# Patient Record
Sex: Male | Born: 1990 | Race: White | Hispanic: No | Marital: Married | Smoking: Former smoker
Health system: Southern US, Community
[De-identification: ages and names within clinical notes are randomized; demographics above are authoritative.]

## PROBLEM LIST (undated history)

## (undated) DIAGNOSIS — R569 Unspecified convulsions: Secondary | ICD-10-CM

## (undated) DIAGNOSIS — M352 Behcet's disease: Secondary | ICD-10-CM

## (undated) DIAGNOSIS — G0481 Other encephalitis and encephalomyelitis: Secondary | ICD-10-CM

## (undated) DIAGNOSIS — K529 Noninfective gastroenteritis and colitis, unspecified: Secondary | ICD-10-CM

## (undated) DIAGNOSIS — G939 Disorder of brain, unspecified: Secondary | ICD-10-CM

## (undated) HISTORY — PX: HYPOSPADIAS CORRECTION: SHX483

---

## 2013-12-09 ENCOUNTER — Encounter (HOSPITAL_COMMUNITY): Payer: Self-pay | Admitting: Emergency Medicine

## 2013-12-09 ENCOUNTER — Emergency Department (HOSPITAL_COMMUNITY): Payer: Managed Care, Other (non HMO)

## 2013-12-09 ENCOUNTER — Emergency Department (HOSPITAL_COMMUNITY)
Admission: EM | Admit: 2013-12-09 | Discharge: 2013-12-09 | Disposition: A | Discharge status: Home / Self Care | Payer: Managed Care, Other (non HMO) | Attending: Emergency Medicine | Admitting: Emergency Medicine

## 2013-12-09 DIAGNOSIS — R109 Unspecified abdominal pain: Secondary | ICD-10-CM | POA: Insufficient documentation

## 2013-12-09 DIAGNOSIS — F17200 Nicotine dependence, unspecified, uncomplicated: Secondary | ICD-10-CM | POA: Insufficient documentation

## 2013-12-09 DIAGNOSIS — K5289 Other specified noninfective gastroenteritis and colitis: Secondary | ICD-10-CM | POA: Insufficient documentation

## 2013-12-09 HISTORY — DX: Noninfective gastroenteritis and colitis, unspecified: K52.9

## 2013-12-09 LAB — URINALYSIS, ROUTINE W REFLEX MICROSCOPIC
Bilirubin Urine: NEGATIVE
GLUCOSE, UA: NEGATIVE mg/dL
Ketones, ur: NEGATIVE mg/dL
LEUKOCYTES UA: NEGATIVE
Nitrite: NEGATIVE
PH: 5.5 (ref 5.0–8.0)
PROTEIN: NEGATIVE mg/dL
Specific Gravity, Urine: 1.012 (ref 1.005–1.030)
Urobilinogen, UA: 0.2 mg/dL (ref 0.0–1.0)

## 2013-12-09 LAB — COMPREHENSIVE METABOLIC PANEL
ALBUMIN: 4.2 g/dL (ref 3.5–5.2)
ALT: 13 U/L (ref 0–53)
AST: 16 U/L (ref 0–37)
Alkaline Phosphatase: 89 U/L (ref 39–117)
BUN: 10 mg/dL (ref 6–23)
CHLORIDE: 102 meq/L (ref 96–112)
CO2: 24 meq/L (ref 19–32)
Calcium: 9.7 mg/dL (ref 8.4–10.5)
Creatinine, Ser: 0.89 mg/dL (ref 0.50–1.35)
GFR calc Af Amer: >90 mL/min (ref >90)
Glucose, Bld: 101 mg/dL — ABNORMAL HIGH (ref 70–99)
Potassium: 4.1 mEq/L (ref 3.7–5.3)
SODIUM: 138 meq/L (ref 137–147)
Total Bilirubin: <0.2 mg/dL — ABNORMAL LOW (ref 0.3–1.2)
Total Protein: 7.2 g/dL (ref 6.0–8.3)

## 2013-12-09 LAB — CBC
HCT: 41.6 % (ref 39.0–52.0)
Hemoglobin: 14.8 g/dL (ref 13.0–17.0)
MCH: 30.3 pg (ref 26.0–34.0)
MCHC: 35.6 g/dL (ref 30.0–36.0)
MCV: 85.1 fL (ref 78.0–100.0)
PLATELETS: 235 10*3/uL (ref 150–400)
RBC: 4.89 MIL/uL (ref 4.22–5.81)
RDW: 12.3 % (ref 11.5–15.5)
WBC: 15.4 10*3/uL — AB (ref 4.0–10.5)

## 2013-12-09 LAB — LIPASE, BLOOD: Lipase: 22 U/L (ref 11–59)

## 2013-12-09 LAB — URINE MICROSCOPIC-ADD ON

## 2013-12-09 MED ORDER — ONDANSETRON HCL 4 MG PO TABS: 4.0000 mg | ORAL_TABLET | Freq: Four times a day (QID) | ORAL | Status: DC

## 2013-12-09 MED ORDER — SODIUM CHLORIDE 0.9 % IV SOLN
INTRAVENOUS | Status: DC
  Administered 2013-12-09: 04:00:00 via INTRAVENOUS

## 2013-12-09 MED ORDER — FENTANYL CITRATE 0.05 MG/ML IJ SOLN
50.0000 ug | INTRAMUSCULAR | Status: DC | PRN
  Administered 2013-12-09: 50 ug via INTRAVENOUS
  Filled 2013-12-09: qty 2

## 2013-12-09 MED ORDER — HYDROMORPHONE HCL PF 1 MG/ML IJ SOLN: 1.0000 mg | Freq: Once | INTRAMUSCULAR | Status: DC

## 2013-12-09 MED ORDER — IOHEXOL 300 MG/ML  SOLN
100.0000 mL | Freq: Once | INTRAMUSCULAR | Status: AC | PRN
  Administered 2013-12-09: 100 mL via INTRAVENOUS

## 2013-12-09 MED ORDER — ONDANSETRON HCL 4 MG/2ML IJ SOLN
4.0000 mg | Freq: Once | INTRAMUSCULAR | Status: AC
  Administered 2013-12-09: 4 mg via INTRAVENOUS
  Filled 2013-12-09: qty 2

## 2013-12-09 MED ORDER — HYDROMORPHONE HCL PF 1 MG/ML IJ SOLN
1.0000 mg | Freq: Once | INTRAMUSCULAR | Status: AC
  Administered 2013-12-09: 1 mg via INTRAVENOUS
  Filled 2013-12-09: qty 1

## 2013-12-09 MED ORDER — HYDROCODONE-ACETAMINOPHEN 5-325 MG PO TABS: 2.0000 | ORAL_TABLET | ORAL | Status: DC | PRN

## 2013-12-09 MED ORDER — IOHEXOL 300 MG/ML  SOLN
50.0000 mL | Freq: Once | INTRAMUSCULAR | Status: AC | PRN
  Administered 2013-12-09: 50 mL via ORAL

## 2013-12-09 NOTE — ED Notes (Signed)
Patient transported to CT 

## 2013-12-09 NOTE — ED Provider Notes (Signed)
CSN: [id number]     Arrival date & time 12/09/13  0144 History   First MD Initiated Contact with Patient 12/09/13 0309     Chief Complaint  Patient presents with  . Abdominal Pain     (Consider location/radiation/quality/duration/timing/severity/associated sxs/prior Treatment) HPI History provided by patient. Mid abdominal pain onset yesterday, sharp and intermittent pain gradually progressing to moderate to severe pain, doubling him over at times. Patient at work tonight when he became more severe, unable to work and presents here for further evaluation. States he had similar symptoms about a year ago, [facility] and told he may have IBS. He was given outpatient referrals but never followed up. He denies having any significant symptoms over the last year since that time.  With pain, no associated nausea vomiting or diarrhea. No blood in stools. No constipation. No hematuria. No rash. No recent travel. No recent sick contacts. No radiation of pain. No known alleviating or aggravating factors.    Past Medical History  Diagnosis Date  . IBD (inflammatory bowel disease)    Past Surgical History  Procedure Laterality Date  . Hypospadias correction     No family history on file. History  Substance Use Topics  . Smoking status: Current Every Day Smoker -- 0.50 packs/day    Types: Cigarettes  . Smokeless tobacco: Not on file  . Alcohol Use: Yes     Comment: occ    Review of Systems  Constitutional: Negative for fever and chills.  Respiratory: Negative for shortness of breath.   Cardiovascular: Negative for chest pain.  Gastrointestinal: Positive for abdominal pain. Negative for vomiting and blood in stool.  Genitourinary: Negative for hematuria and flank pain.  Musculoskeletal: Negative for back pain.  Skin: Negative for rash.  Neurological: Negative for weakness.  All other systems reviewed and are negative.      Allergies  Review of patient's allergies  indicates no known allergies.  Home Medications   Current Outpatient Rx  Name  Route  Sig  Dispense  Refill  . ibuprofen (ADVIL,MOTRIN) 200 MG tablet   Oral   Take 400 mg by mouth every 6 (six) hours as needed.          BP 129/69  Pulse 98  Temp(Src) 98.1 F (36.7 C) (Oral)  Resp 20  SpO2 98% Physical Exam  Constitutional: He is oriented to person, place, and time. He appears well-developed and well-nourished.  HENT:  Head: Normocephalic and atraumatic.  Eyes: EOM are normal. Pupils are equal, round, and reactive to light. No scleral icterus.  Neck: Neck supple.  Cardiovascular: Normal rate, regular rhythm and intact distal pulses.   Pulmonary/Chest: Effort normal and breath sounds normal. No respiratory distress.  Abdominal: Soft. Bowel sounds are normal. He exhibits no mass.  Tender periumbilical and right lower quadrant with some voluntary guarding  Musculoskeletal: Normal range of motion. He exhibits no edema.  Neurological: He is alert and oriented to person, place, and time.  Skin: Skin is warm and dry.    ED Course  Procedures (including critical care time) Labs Review Labs Reviewed  CBC - Abnormal; Notable for the following:    WBC 15.4 (*)    All other components within normal limits  COMPREHENSIVE METABOLIC PANEL - Abnormal; Notable for the following:    Glucose, Bld 101 (*)    Total Bilirubin <0.2 (*)    All other components within normal limits  URINALYSIS, ROUTINE W REFLEX MICROSCOPIC - Abnormal; Notable for the following:  Hgb urine dipstick TRACE (*)    All other components within normal limits  LIPASE, BLOOD  URINE MICROSCOPIC-ADD ON   Imaging Review Ct Abdomen Pelvis W Contrast  12/09/2013   CLINICAL DATA:  Mid and right lower quadrant abdominal pain  EXAM: CT ABDOMEN AND PELVIS WITH CONTRAST  TECHNIQUE: Multidetector CT imaging of the abdomen and pelvis was performed using the standard protocol following bolus administration of intravenous  contrast.  CONTRAST:  OMNIPAQUE IOHEXOL 300 MG/ML  SOLN  COMPARISON:  Prior CT from 01/27/2013.  FINDINGS: The visualized lung bases are clear.  The liver demonstrates a normal contrast enhanced appearance. Gallbladder is within normal limits. No biliary ductal dilatation. The spleen, adrenal glands, and pancreas demonstrate a normal contrast enhanced appearance.  The kidneys are equal in size with symmetric enhancement. No nephrolithiasis, hydronephrosis, or focal enhancing renal mass.  There is no evidence of bowel obstruction. The appendix is mildly prominent measuring up to 7 mm without associated inflammatory changes to suggest acute appendicitis. This finding is stable as compared to prior study. No abnormal wall thickening, mucosal enhancement, or inflammatory fat stranding seen about the bowels.  Bladder is normal.  Prostate within normal limits.  No free air or fluid. No enlarged intra-abdominal pelvic lymph nodes.  No acute osseous abnormality. No focal lytic or blastic osseous lesions.  IMPRESSION: 1. No CT evidence of acute intra-abdominal or pelvic process. 2. Mildly prominent appendix measuring up to 7 mm in diameter without associated inflammatory changes. This finding is stable as compared to prior CT from 01/27/2013.   Electronically Signed   By: Rise Mu M.D.   On: 12/09/2013 05:04    IV Dilaudid. IV Zofran. IV fluids.  On recheck is resting comfortably with pain resolved. Abdomen is soft, nontender nondistended. Requesting GI referral for questionable history of IBD diagnosed at outside ER.    Plan discharge home with GI referral, prescription for Norco as needed and strict return precautions which were verbalized as understood.  MDM   Final diagnoses:  Abdominal pain   Presenting with sharp severe abdominal pain periumbilical and right lower quadrant. Evaluated with labs, urinalysis and CT scan reviewed as above. Pain controlled with IV narcotics. Pain control and  repeat exam no acute abdomen. Vital signs and nursing notes reviewed and considered.    [name], MD 12/09/13 [phone]

## 2013-12-09 NOTE — ED Notes (Signed)
Pt states that he has intermittent abd pain x 2 days; pt states that the pain intensified at work this evening; pt states that he became short of breath and near syncopal at work tonight due to the pain; pt denies N/V/D; no LOC

## 2013-12-09 NOTE — Discharge Instructions (Signed)
Abdominal Pain, Adult Rest today, drink plenty of fluids and take medications as needed. Call the morning to schedule followup with GI specialist.  Return here for any fevers, severe pain, vomiting or worsening condition.   Many things can cause abdominal pain. Usually, abdominal pain is not caused by a disease and will improve without treatment. It can often be observed and treated at home. Your health care provider will do a physical exam and possibly order blood tests and X-rays to help determine the seriousness of your pain. However, in many cases, more time must pass before a clear cause of the pain can be found. Before that point, your health care provider may not know if you need more testing or further treatment. HOME CARE INSTRUCTIONS  Monitor your abdominal pain for any changes. The following actions may help to alleviate any discomfort you are experiencing:  Only take over-the-counter or prescription medicines as directed by your health care provider.  Do not take laxatives unless directed to do so by your health care provider.  Try a clear liquid diet (broth, tea, or water) as directed by your health care provider. Slowly move to a bland diet as tolerated. SEEK MEDICAL CARE IF:  You have unexplained abdominal pain.  You have abdominal pain associated with nausea or diarrhea.  You have pain when you urinate or have a bowel movement.  You experience abdominal pain that wakes you in the night.  You have abdominal pain that is worsened or improved by eating food.  You have abdominal pain that is worsened with eating fatty foods. SEEK IMMEDIATE MEDICAL CARE IF:   Your pain does not go away within 2 hours.  You have a fever.  You keep throwing up (vomiting).  Your pain is felt only in portions of the abdomen, such as the right side or the left lower portion of the abdomen.  You pass bloody or black tarry stools. MAKE SURE YOU:  Understand these instructions.   Will  watch your condition.   Will get help right away if you are not doing well or get worse.  Document Released: 07/03/2005 Document Revised: 07/14/2013 Document Reviewed: 06/02/2013 [facility] Patient Information 2014 [employer] [place].

## 2014-08-17 ENCOUNTER — Emergency Department (HOSPITAL_COMMUNITY)
Admission: EM | Admit: 2014-08-17 | Discharge: 2014-08-17 | Disposition: A | Discharge status: Home / Self Care | Payer: Worker's Compensation | Attending: Emergency Medicine | Admitting: Emergency Medicine

## 2014-08-17 ENCOUNTER — Encounter (HOSPITAL_COMMUNITY): Payer: Self-pay | Admitting: Emergency Medicine

## 2014-08-17 ENCOUNTER — Emergency Department (HOSPITAL_COMMUNITY): Payer: Worker's Compensation

## 2014-08-17 DIAGNOSIS — S7012XA Contusion of left thigh, initial encounter: Secondary | ICD-10-CM | POA: Diagnosis not present

## 2014-08-17 DIAGNOSIS — Y998 Other external cause status: Secondary | ICD-10-CM | POA: Insufficient documentation

## 2014-08-17 DIAGNOSIS — Y9389 Activity, other specified: Secondary | ICD-10-CM | POA: Diagnosis not present

## 2014-08-17 DIAGNOSIS — Z79899 Other long term (current) drug therapy: Secondary | ICD-10-CM | POA: Diagnosis not present

## 2014-08-17 DIAGNOSIS — S8992XA Unspecified injury of left lower leg, initial encounter: Secondary | ICD-10-CM | POA: Insufficient documentation

## 2014-08-17 DIAGNOSIS — Z720 Tobacco use: Secondary | ICD-10-CM | POA: Insufficient documentation

## 2014-08-17 DIAGNOSIS — Y9289 Other specified places as the place of occurrence of the external cause: Secondary | ICD-10-CM | POA: Insufficient documentation

## 2014-08-17 DIAGNOSIS — W230XXA Caught, crushed, jammed, or pinched between moving objects, initial encounter: Secondary | ICD-10-CM | POA: Diagnosis not present

## 2014-08-17 DIAGNOSIS — Z791 Long term (current) use of non-steroidal anti-inflammatories (NSAID): Secondary | ICD-10-CM | POA: Diagnosis not present

## 2014-08-17 DIAGNOSIS — Z7951 Long term (current) use of inhaled steroids: Secondary | ICD-10-CM | POA: Insufficient documentation

## 2014-08-17 DIAGNOSIS — S79922A Unspecified injury of left thigh, initial encounter: Secondary | ICD-10-CM | POA: Diagnosis present

## 2014-08-17 DIAGNOSIS — Z8719 Personal history of other diseases of the digestive system: Secondary | ICD-10-CM | POA: Diagnosis not present

## 2014-08-17 LAB — COMPREHENSIVE METABOLIC PANEL
ALK PHOS: 75 U/L (ref 39–117)
ALT: 9 U/L (ref 0–53)
AST: 14 U/L (ref 0–37)
Albumin: 3.9 g/dL (ref 3.5–5.2)
Anion gap: 15 (ref 5–15)
BILIRUBIN TOTAL: 0.4 mg/dL (ref 0.3–1.2)
BUN: 11 mg/dL (ref 6–23)
CHLORIDE: 101 meq/L (ref 96–112)
CO2: 24 meq/L (ref 19–32)
Calcium: 9.9 mg/dL (ref 8.4–10.5)
Creatinine, Ser: 0.83 mg/dL (ref 0.50–1.35)
Glucose, Bld: 91 mg/dL (ref 70–99)
Potassium: 4.2 mEq/L (ref 3.7–5.3)
SODIUM: 140 meq/L (ref 137–147)
Total Protein: 7.6 g/dL (ref 6.0–8.3)

## 2014-08-17 LAB — CBC WITH DIFFERENTIAL/PLATELET
Basophils Absolute: 0 10*3/uL (ref 0.0–0.1)
Basophils Relative: 0 % (ref 0–1)
Eosinophils Absolute: 0.1 10*3/uL (ref 0.0–0.7)
Eosinophils Relative: 1 % (ref 0–5)
HCT: 40.8 % (ref 39.0–52.0)
HEMOGLOBIN: 14 g/dL (ref 13.0–17.0)
LYMPHS ABS: 2.9 10*3/uL (ref 0.7–4.0)
LYMPHS PCT: 20 % (ref 12–46)
MCH: 30.4 pg (ref 26.0–34.0)
MCHC: 34.3 g/dL (ref 30.0–36.0)
MCV: 88.7 fL (ref 78.0–100.0)
MONOS PCT: 11 % (ref 3–12)
Monocytes Absolute: 1.6 10*3/uL — ABNORMAL HIGH (ref 0.1–1.0)
NEUTROS PCT: 68 % (ref 43–77)
Neutro Abs: 9.8 10*3/uL — ABNORMAL HIGH (ref 1.7–7.7)
PLATELETS: 274 10*3/uL (ref 150–400)
RBC: 4.6 MIL/uL (ref 4.22–5.81)
RDW: 12.4 % (ref 11.5–15.5)
WBC: 14.2 10*3/uL — AB (ref 4.0–10.5)

## 2014-08-17 LAB — I-STAT CG4 LACTIC ACID, ED: Lactic Acid, Venous: 0.67 mmol/L (ref 0.5–2.2)

## 2014-08-17 MED ORDER — OXYCODONE-ACETAMINOPHEN 5-325 MG PO TABS: 2.0000 | ORAL_TABLET | Freq: Four times a day (QID) | ORAL | Status: DC | PRN

## 2014-08-17 MED ORDER — MORPHINE SULFATE 4 MG/ML IJ SOLN
4.0000 mg | Freq: Once | INTRAMUSCULAR | Status: AC
Start: 2014-08-17 — End: 2014-08-17
  Administered 2014-08-17: 4 mg via INTRAVENOUS
  Filled 2014-08-17: qty 1

## 2014-08-17 MED ORDER — IOHEXOL 300 MG/ML  SOLN
100.0000 mL | Freq: Once | INTRAMUSCULAR | Status: AC | PRN
  Administered 2014-08-17: 100 mL via INTRAVENOUS

## 2014-08-17 MED ORDER — HYDROMORPHONE HCL 1 MG/ML IJ SOLN
1.0000 mg | Freq: Once | INTRAMUSCULAR | Status: AC
  Administered 2014-08-17: 1 mg via INTRAVENOUS
  Filled 2014-08-17: qty 1

## 2014-08-17 MED ORDER — SODIUM CHLORIDE 0.9 % IV BOLUS (SEPSIS)
1000.0000 mL | Freq: Once | INTRAVENOUS | Status: AC
Start: 2014-08-17 — End: 2014-08-17
  Administered 2014-08-17: 1000 mL via INTRAVENOUS

## 2014-08-17 NOTE — Discharge Instructions (Signed)
Use crutches. Keep tight ace bandage on as it will help the swelling.   Apply warm compresses.   Take percocet as prescribed. You may not drive.   Follow up with orthopedic doctor. Do not return to work until you see ortho.   Return to ER if you have severe pain, numbness, leg turning blue.

## 2014-08-17 NOTE — ED Notes (Signed)
Pt reports "they told me I need abx", upon further questioning, pt reports blood was checked x3 days ago and "white count was [id number]", pt also reports having a fever intermittently over the last few days.

## 2014-08-17 NOTE — ED Notes (Addendum)
Pt A+Ox4, 10/10 pain to L leg x1 week after having leg crushed between pallet jack and steel freezer.  Pt reports rested and attempted to return to work this week and unable 2/2 pain.  Pt reports seen at urgent care x3 days ago and told to go to ortho and ortho office told him to come straight to ER.  Pt denies n/t to extremity, only pain.  3/10 at rest, 10/10 pain with movement.  Yellow/green bruising noted to medial aspect thigh.  Pt ambulating with crutches.  Pt denies other complaints.  NAD.

## 2014-08-17 NOTE — ED Notes (Signed)
Patient transported to CT 

## 2014-08-17 NOTE — ED Provider Notes (Addendum)
CSN: [id number]     Arrival date & time 08/17/14  1010 History   First MD Initiated Contact with Patient 08/17/14 1031     Chief Complaint  Patient presents with  . Leg Pain    L leg crushed between pallet jack and steel freezer x1 week ago     (Consider location/radiation/quality/duration/timing/severity/associated sxs/prior Treatment) The history is provided by the patient.  Nathan Daugherty is a 23 y.o. male hx of IBD here with left leg pain. He works in a [employer]. About a week ago his left thigh got accidentally pinched between a machine and the steel freezer. He was very heavy pants at the time and noticed some minimal swelling in the left thigh. He knows that the swelling got worse over the last week. 2 days ago went to see his doctor for [employer] and was noted to have a white count 17,000 and normal x-ray. He was prescribed Percocet at night and given crutches. However he states that the pain has not been getting better. Has some subjective chills but no fever which ones of breath or chest pain. He states that when he stays still it doesn't hurt as much but it hurts when he moves the leg. Denies any numbness or leg turning blue.    Past Medical History  Diagnosis Date  . IBD (inflammatory bowel disease)    Past Surgical History  Procedure Laterality Date  . Hypospadias correction     No family history on file. History  Substance Use Topics  . Smoking status: Current Every Day Smoker -- 0.50 packs/day    Types: Cigarettes  . Smokeless tobacco: Not on file  . Alcohol Use: Yes     Comment: occ    Review of Systems  Musculoskeletal:       L leg pain   All other systems reviewed and are negative.     Allergies  Review of patient's allergies indicates no known allergies.  Home Medications   Prior to Admission medications   Medication Sig Start Date End Date Taking? Authorizing Provider  cetirizine (ZYRTEC) 10 MG tablet Take 10 mg by mouth daily.    Yes Historical Provider, MD  fluticasone (FLONASE) 50 MCG/ACT nasal spray Place 2 sprays into both nostrils daily.   Yes Historical Provider, MD  ibuprofen (ADVIL,MOTRIN) 800 MG tablet Take 800 mg by mouth 3 (three) times daily.   Yes Historical Provider, MD  methocarbamol (ROBAXIN) 500 MG tablet Take 500 mg by mouth 3 (three) times daily.   Yes Historical Provider, MD  oxyCODONE-acetaminophen (PERCOCET/ROXICET) 5-325 MG per tablet Take 1 tablet by mouth at bedtime.   Yes Historical Provider, MD  PRESCRIPTION MEDICATION Cortizone Shots in buttocks at companies dr's office   Yes Historical Provider, MD  HYDROcodone-acetaminophen (NORCO/VICODIN) 5-325 MG per tablet Take 2 tablets by mouth every 4 (four) hours as needed. 12/09/13   Sunnie NielsenBrian Opitz, MD  ibuprofen (ADVIL,MOTRIN) 200 MG tablet Take 400 mg by mouth every 6 (six) hours as needed.    Historical Provider, MD  ondansetron (ZOFRAN) 4 MG tablet Take 1 tablet (4 mg total) by mouth every 6 (six) hours. 12/09/13   Sunnie NielsenBrian Opitz, MD   BP 115/61 mmHg  Pulse 83  Temp(Src) 98.5 F (36.9 C) (Oral)  Resp 18  SpO2 100% Physical Exam  Constitutional: He is oriented to person, place, and time.  Uncomfortable   HENT:  Head: Normocephalic and atraumatic.  Mouth/Throat: Oropharynx is clear and moist.  Eyes: Conjunctivae are normal.  Pupils are equal, round, and reactive to light.  Neck: Normal range of motion. Neck supple.  Cardiovascular: Normal rate.   Pulmonary/Chest: Effort normal and breath sounds normal. No respiratory distress. He has no wheezes. He has no rales.  Abdominal: Soft. Bowel sounds are normal. He exhibits no distension. There is no tenderness. There is no rebound.  Musculoskeletal:  L thigh slight swelling medial aspect. Compartments are soft. 2+ femoral, popliteal pulses. Nl ROM L knee and hip. No obvious bony tenderness on femur  Neurological: He is alert and oriented to person, place, and time. No cranial nerve deficit. Coordination  normal.  Skin: Skin is warm and dry.  Psychiatric: He has a normal mood and affect. His behavior is normal. Judgment and thought content normal.  Nursing note and vitals reviewed.   ED Course  Procedures (including critical care time) Labs Review Labs Reviewed  CBC WITH DIFFERENTIAL - Abnormal; Notable for the following:    WBC 14.2 (*)    Neutro Abs 9.8 (*)    Monocytes Absolute 1.6 (*)    All other components within normal limits  COMPREHENSIVE METABOLIC PANEL  I-STAT CG4 LACTIC ACID, ED    Imaging Review Ct Femur Left W Contrast  08/17/2014   CLINICAL DATA:  Extreme left thigh pain worsening with activity following injury 1 week ago. No reported fevers. Bruising. Initial encounter.  EXAM: CT OF THE LEFT FEMUR WITH CONTRAST  TECHNIQUE: Multidetector CT imaging of the left thigh was performed according to the standard protocol following intravenous contrast administration. Sagittal and coronal plane reformatted images were reconstructed from the axial CT data  CONTRAST:  100mL OMNIPAQUE IOHEXOL 300 MG/ML  SOLN  COMPARISON:  Pelvic CT 12/09/2013  FINDINGS: There is no evidence of acute fracture, dislocation or bone destruction. There is no significant effusion at the left hip or knee. The joint spaces appear maintained.  There is mild soft tissue stranding within the subcutaneous fat of the anteromedial left thigh, likely posttraumatic contusion. There is ill-defined low-density within the vastus medialis muscle in the distal third of the thigh. This measures up to 1.2 x 2.5 cm transverse and extends approximately 5.2 cm cephalocaudad. In the setting of trauma, this is most likely a hematoma related to muscular injury. The other thigh muscles appear normal. The quadriceps tendon is intact. No vascular abnormalities are identified.  IMPRESSION: 1. Nonspecific low-density within the distal left vastus medialis muscle. In the setting of trauma, this is most likely a posttraumatic hematoma.  Infection should be excluded clinically. 2. No evidence of vascular or osseous injury.   Electronically Signed   By: Roxy HorsemanBill  Veazey M.D.   On: 08/17/2014 13:09     EKG Interpretation None      MDM   Final diagnoses:  Leg injury, left, initial encounter   Nathan HeldCody Daugherty is a 23 y.o. male here with L leg pain. Given nl xray 2 days and leukocytosis, I ordered ct to further assess. CT showed hematoma L vastus medialis muscle. WBC improved to 14 No signs of sepsis or cellulitis. I think elevated WBC count is likely from inflammation from hematoma. Pain improved with pain meds. I called Dr. Roda ShuttersXu, who is not very concerned with compartment syndrome. His lactate is nl which makes compartment syndrome unlikely as well. Recommend tight ACE wrap, continue crutches, warm compresses, and ortho f/u.      Richardean Canalavid H Aralyn Nowak, MD 08/17/14 1410  Richardean Canalavid H Jorgina Binning, MD 08/17/14 [phone]

## 2016-10-21 ENCOUNTER — Inpatient Hospital Stay (HOSPITAL_BASED_OUTPATIENT_CLINIC_OR_DEPARTMENT_OTHER)
Admission: EM | Admit: 2016-10-21 | Discharge: 2016-10-25 | DRG: 372 | Disposition: A | Discharge status: Home Health | Payer: Commercial Managed Care - PPO | Attending: Internal Medicine | Admitting: Internal Medicine

## 2016-10-21 ENCOUNTER — Emergency Department (HOSPITAL_BASED_OUTPATIENT_CLINIC_OR_DEPARTMENT_OTHER): Payer: Commercial Managed Care - PPO

## 2016-10-21 ENCOUNTER — Encounter (HOSPITAL_BASED_OUTPATIENT_CLINIC_OR_DEPARTMENT_OTHER): Payer: Self-pay | Admitting: *Deleted

## 2016-10-21 DIAGNOSIS — A0472 Enterocolitis due to Clostridium difficile, not specified as recurrent: Secondary | ICD-10-CM | POA: Diagnosis not present

## 2016-10-21 DIAGNOSIS — R05 Cough: Secondary | ICD-10-CM

## 2016-10-21 DIAGNOSIS — Z833 Family history of diabetes mellitus: Secondary | ICD-10-CM

## 2016-10-21 DIAGNOSIS — Z9889 Other specified postprocedural states: Secondary | ICD-10-CM

## 2016-10-21 DIAGNOSIS — Z79899 Other long term (current) drug therapy: Secondary | ICD-10-CM

## 2016-10-21 DIAGNOSIS — Z7951 Long term (current) use of inhaled steroids: Secondary | ICD-10-CM

## 2016-10-21 DIAGNOSIS — D72828 Other elevated white blood cell count: Secondary | ICD-10-CM

## 2016-10-21 DIAGNOSIS — Z803 Family history of malignant neoplasm of breast: Secondary | ICD-10-CM

## 2016-10-21 DIAGNOSIS — R509 Fever, unspecified: Secondary | ICD-10-CM

## 2016-10-21 DIAGNOSIS — R059 Cough, unspecified: Secondary | ICD-10-CM

## 2016-10-21 DIAGNOSIS — Z801 Family history of malignant neoplasm of trachea, bronchus and lung: Secondary | ICD-10-CM

## 2016-10-21 DIAGNOSIS — F17210 Nicotine dependence, cigarettes, uncomplicated: Secondary | ICD-10-CM | POA: Diagnosis present

## 2016-10-21 DIAGNOSIS — R1031 Right lower quadrant pain: Secondary | ICD-10-CM

## 2016-10-21 DIAGNOSIS — K5100 Ulcerative (chronic) pancolitis without complications: Secondary | ICD-10-CM | POA: Diagnosis present

## 2016-10-21 DIAGNOSIS — E876 Hypokalemia: Secondary | ICD-10-CM | POA: Diagnosis present

## 2016-10-21 LAB — BASIC METABOLIC PANEL
Anion gap: 9 (ref 5–15)
BUN: 6 mg/dL (ref 6–20)
CO2: 21 mmol/L — ABNORMAL LOW (ref 22–32)
CREATININE: 0.85 mg/dL (ref 0.61–1.24)
Calcium: 9.3 mg/dL (ref 8.9–10.3)
Chloride: 105 mmol/L (ref 101–111)
GFR calc Af Amer: >60 mL/min (ref >60)
GLUCOSE: 104 mg/dL — AB (ref 65–99)
POTASSIUM: 3.3 mmol/L — AB (ref 3.5–5.1)
Sodium: 135 mmol/L (ref 135–145)

## 2016-10-21 LAB — CBC WITH DIFFERENTIAL/PLATELET
Basophils Absolute: 0 10*3/uL (ref 0.0–0.1)
Basophils Relative: 0 %
EOS PCT: 1 %
Eosinophils Absolute: 0.1 10*3/uL (ref 0.0–0.7)
HCT: 41.6 % (ref 39.0–52.0)
Hemoglobin: 14.6 g/dL (ref 13.0–17.0)
LYMPHS ABS: 3.4 10*3/uL (ref 0.7–4.0)
LYMPHS PCT: 17 %
MCH: 29.3 pg (ref 26.0–34.0)
MCHC: 35.1 g/dL (ref 30.0–36.0)
MCV: 83.5 fL (ref 78.0–100.0)
MONO ABS: 2.5 10*3/uL — AB (ref 0.1–1.0)
MONOS PCT: 12 %
Neutro Abs: 14.4 10*3/uL — ABNORMAL HIGH (ref 1.7–7.7)
Neutrophils Relative %: 70 %
PLATELETS: 307 10*3/uL (ref 150–400)
RBC: 4.98 MIL/uL (ref 4.22–5.81)
RDW: 12.4 % (ref 11.5–15.5)
WBC: 20.5 10*3/uL — ABNORMAL HIGH (ref 4.0–10.5)

## 2016-10-21 LAB — URINALYSIS, MICROSCOPIC (REFLEX)

## 2016-10-21 LAB — URINALYSIS, ROUTINE W REFLEX MICROSCOPIC
Bilirubin Urine: NEGATIVE
GLUCOSE, UA: NEGATIVE mg/dL
Ketones, ur: 15 mg/dL — AB
Nitrite: NEGATIVE
Protein, ur: NEGATIVE mg/dL
SPECIFIC GRAVITY, URINE: 1.014 (ref 1.005–1.030)
pH: 7 (ref 5.0–8.0)

## 2016-10-21 LAB — I-STAT CG4 LACTIC ACID, ED: Lactic Acid, Venous: 0.56 mmol/L (ref 0.5–1.9)

## 2016-10-21 MED ORDER — ONDANSETRON HCL 4 MG/2ML IJ SOLN
4.0000 mg | Freq: Once | INTRAMUSCULAR | Status: AC
  Administered 2016-10-21: 4 mg via INTRAVENOUS
  Filled 2016-10-21: qty 2

## 2016-10-21 MED ORDER — ACETAMINOPHEN 500 MG PO TABS
1000.0000 mg | ORAL_TABLET | Freq: Once | ORAL | Status: AC
  Administered 2016-10-21: 1000 mg via ORAL
  Filled 2016-10-21: qty 2

## 2016-10-21 MED ORDER — SODIUM CHLORIDE 0.9 % IV BOLUS (SEPSIS)
1000.0000 mL | Freq: Once | INTRAVENOUS | Status: AC
  Administered 2016-10-21: 1000 mL via INTRAVENOUS

## 2016-10-21 NOTE — ED Notes (Signed)
ED Provider at bedside. 

## 2016-10-21 NOTE — ED Provider Notes (Signed)
[facility] Provider Note   CSN: [id number] Arrival date & time: 10/21/16  2028  By signing my name below, I, [name], attest that this documentation has been prepared under the direction and in the presence of Nira Conn, MD.  Electronically Signed: Octavia Heir, ED Scribe. 10/21/16. 10:46 PM.    History   Chief Complaint Chief Complaint  Patient presents with  . Fever    The history is provided by the patient. No language interpreter was used.   HPI Comments: Nathan Daugherty is a 26 y.o. male who presents to the Emergency Department complaining of a moderate, intermittent fever (tmax 103) that began today. Pt has been having associated generalized myalgias, flank pain, sore throat, pain with swallowing and cough, loss of appetite, chest congestion, mild headache, right sided neck pain, and voice changes. Per wife, his symptoms started 1 week ago with similar presentation. Pt was tested for strep throat and the flu which both came back negative. He was diagnosed with a bronchial infection and sinus infection in which he was prescribed Augmentin. He has been taking his augmentin BID for the past 8 days. Wife notes he got better 5 days ago but his symptoms came back last night. Pt has been consuming water and fluids but has had loss of appetite.  Pt denies sinus pressure or facial pain. Pt is an occasional smoker.   Pt also presents with RLQ abdominal pain that is worse with movement that started today. He has no surgical hx of appendectomy. Wife notes that pt has hospitalized frequently for abdominal problems but she says that he has not been complaining about his abdomen recently. He has a hx of being diagnosed with ulcerative colitis. Pt has had a colonoscopy performed in the past. Pt has no known drug allergies.  Past Medical History:  Diagnosis Date  . IBD (inflammatory bowel disease)     There are no active problems to display for this  patient.   Past Surgical History:  Procedure Laterality Date  . HYPOSPADIAS CORRECTION         Home Medications    Prior to Admission medications   Medication Sig Start Date End Date Taking? Authorizing Provider  cetirizine (ZYRTEC) 10 MG tablet Take 10 mg by mouth daily.    Historical Provider, MD  fluticasone (FLONASE) 50 MCG/ACT nasal spray Place 2 sprays into both nostrils daily.    Historical Provider, MD  HYDROcodone-acetaminophen (NORCO/VICODIN) 5-325 MG per tablet Take 2 tablets by mouth every 4 (four) hours as needed. 12/09/13   [name], MD  ibuprofen (ADVIL,MOTRIN) 200 MG tablet Take 400 mg by mouth every 6 (six) hours as needed.    Historical Provider, MD  ibuprofen (ADVIL,MOTRIN) 800 MG tablet Take 800 mg by mouth 3 (three) times daily.    Historical Provider, MD  methocarbamol (ROBAXIN) 500 MG tablet Take 500 mg by mouth 3 (three) times daily.    Historical Provider, MD  ondansetron (ZOFRAN) 4 MG tablet Take 1 tablet (4 mg total) by mouth every 6 (six) hours. 12/09/13   [name], MD  oxyCODONE-acetaminophen (PERCOCET) 5-325 MG per tablet Take 2 tablets by mouth every 6 (six) hours as needed. 08/17/14   [name], MD  PRESCRIPTION MEDICATION Cortizone Shots in buttocks at companies dr's office    Historical Provider, MD    Family History No family history on file.  Social History Social History  Substance Use Topics  . Smoking status: Current Every Day Smoker  Packs/day: 0.50    Types: Cigarettes  . Smokeless tobacco: Never Used  . Alcohol use Yes     Comment: occ     Allergies   Patient has no known allergies.   Review of Systems Review of Systems  A complete 10 system review of systems was obtained and all systems are negative except as noted in the HPI and PMH.   Physical Exam Updated Vital Signs BP 107/55   Pulse 71   Temp 101 F (37.1 C) (Oral)   Resp 20   Ht 6' (1.829 m)   Wt 175 lb (79.4 kg)   SpO2 99%   BMI 23.73 kg/m    Physical Exam  Constitutional: He is oriented to person, place, and time. He appears well-developed and well-nourished. No distress.  HENT:  Head: Normocephalic and atraumatic.  Nose: Nose normal.  Eyes: Conjunctivae and EOM are normal. Pupils are equal, round, and reactive to light. Right eye exhibits no discharge. Left eye exhibits no discharge. No scleral icterus.  Neck: Normal range of motion. Neck supple. No neck rigidity. No Brudzinski's sign and no Kernig's sign noted.  No masses palpated  Cardiovascular: Normal rate and regular rhythm.  Exam reveals no gallop and no friction rub.   No murmur heard. Pulmonary/Chest: Effort normal and breath sounds normal. No stridor. No respiratory distress. He has no rales.  Abdominal: Soft. He exhibits no distension. There is tenderness. There is rebound and guarding.  RLQ tenderness with positive obturator and positive Rovsings   Musculoskeletal: He exhibits no edema or tenderness.  Lymphadenopathy:    He has no cervical adenopathy.  Neurological: He is alert and oriented to person, place, and time.  Skin: Skin is warm and dry. No rash noted. He is not diaphoretic. No erythema.  Psychiatric: He has a normal mood and affect.  Vitals reviewed.    ED Treatments / Results  DIAGNOSTIC STUDIES: Oxygen Saturation is 99% on RA, normal by my interpretation.  COORDINATION OF CARE:  10:39 PM Discussed treatment plan with pt at bedside and pt agreed to plan.  Labs (all labs ordered are listed, but only abnormal results are displayed) Labs Reviewed  URINALYSIS, ROUTINE W REFLEX MICROSCOPIC - Abnormal; Notable for the following:       Result Value   Hgb urine dipstick MODERATE (*)    Ketones, ur 15 (*)    Leukocytes, UA TRACE (*)    All other components within normal limits  CBC WITH DIFFERENTIAL/PLATELET - Abnormal; Notable for the following:    WBC 20.5 (*)    Neutro Abs 14.4 (*)    Monocytes Absolute 2.5 (*)    All other components  within normal limits  BASIC METABOLIC PANEL - Abnormal; Notable for the following:    Potassium 3.3 (*)    CO2 21 (*)    Glucose, Bld 104 (*)    All other components within normal limits  URINALYSIS, MICROSCOPIC (REFLEX) - Abnormal; Notable for the following:    Bacteria, UA RARE (*)    Squamous Epithelial / LPF 0-5 (*)    All other components within normal limits  INFLUENZA PANEL BY PCR (TYPE A & B)  I-STAT CG4 LACTIC ACID, ED    EKG  EKG Interpretation None       Radiology Dg Chest 2 View  Result Date: 10/21/2016 CLINICAL DATA:  Shortness of breath, chest pain, fever, chills for 1 week. EXAM: CHEST  2 VIEW COMPARISON:  Chest radiograph October 14, 2015 FINDINGS: Cardiomediastinal  silhouette is normal. No pleural effusions or focal consolidations. Trachea projects midline and there is no pneumothorax. Soft tissue planes and included osseous structures are non-suspicious. IMPRESSION: Normal chest. Electronically Signed   By: Awilda Metroourtnay  Bloomer M.D.   On: 10/21/2016 22:22    Procedures Procedures (including critical care time)  Medications Ordered in ED Medications  acetaminophen (TYLENOL) tablet 1,000 mg (1,000 mg Oral Given 10/21/16 2052)  sodium chloride 0.9 % bolus 1,000 mL (1,000 mLs Intravenous New Bag/Given 10/21/16 2338)  ondansetron (ZOFRAN) injection 4 mg (4 mg Intravenous Given 10/21/16 2338)     Initial Impression / Assessment and Plan / ED Course  I have reviewed the triage vital signs and the nursing notes.  Pertinent labs & imaging results that were available during my care of the patient were reviewed by me and considered in my medical decision making (see chart for details).  Clinical Course     Patient is febrile and ill-appearing, however nontoxic. Labs with significant leukocytosis. UA without evidence of infection. Chest x-ray without evidence of pneumonia. Presentation not consistent with meningitis. No significant sinus tenderness consistent with  sinusitis. No evidence of acute otitis media. No evidence of pharyngitis or peritonsillar abscess.   Will obtain CT to r/o intraabdominal process.  Patient care turned over to Dr Particia NearingHaviland at [facility]. Patient case and results discussed in detail; please see their note for further ED managment.    Final Clinical Impressions(s) / ED Diagnoses   Final diagnoses:  Fever, unspecified fever cause  Other elevated white blood cell (WBC) count  RLQ abdominal pain  Cough     Nira ConnPedro Eduardo Asianna Brundage, MD 10/22/16 0031

## 2016-10-21 NOTE — ED Triage Notes (Signed)
He was treated for a sinus infection over a week ago. Has been taking antibiotics. Fever went away 5 days ago. Fever today. Wife states he gets confused when he gets a fever. He has not had any medication to control his fever since lunch.

## 2016-10-22 ENCOUNTER — Encounter (HOSPITAL_COMMUNITY): Payer: Self-pay | Admitting: Internal Medicine

## 2016-10-22 DIAGNOSIS — K5100 Ulcerative (chronic) pancolitis without complications: Secondary | ICD-10-CM | POA: Diagnosis present

## 2016-10-22 DIAGNOSIS — E876 Hypokalemia: Secondary | ICD-10-CM | POA: Diagnosis present

## 2016-10-22 DIAGNOSIS — Z9889 Other specified postprocedural states: Secondary | ICD-10-CM | POA: Diagnosis not present

## 2016-10-22 DIAGNOSIS — R05 Cough: Secondary | ICD-10-CM | POA: Diagnosis present

## 2016-10-22 DIAGNOSIS — Z79899 Other long term (current) drug therapy: Secondary | ICD-10-CM | POA: Diagnosis not present

## 2016-10-22 DIAGNOSIS — Z801 Family history of malignant neoplasm of trachea, bronchus and lung: Secondary | ICD-10-CM | POA: Diagnosis not present

## 2016-10-22 DIAGNOSIS — F17210 Nicotine dependence, cigarettes, uncomplicated: Secondary | ICD-10-CM | POA: Diagnosis present

## 2016-10-22 DIAGNOSIS — Z803 Family history of malignant neoplasm of breast: Secondary | ICD-10-CM | POA: Diagnosis not present

## 2016-10-22 DIAGNOSIS — K529 Noninfective gastroenteritis and colitis, unspecified: Secondary | ICD-10-CM | POA: Diagnosis not present

## 2016-10-22 DIAGNOSIS — A0472 Enterocolitis due to Clostridium difficile, not specified as recurrent: Secondary | ICD-10-CM | POA: Diagnosis not present

## 2016-10-22 DIAGNOSIS — Z7951 Long term (current) use of inhaled steroids: Secondary | ICD-10-CM | POA: Diagnosis not present

## 2016-10-22 DIAGNOSIS — Z833 Family history of diabetes mellitus: Secondary | ICD-10-CM | POA: Diagnosis not present

## 2016-10-22 LAB — CBC WITH DIFFERENTIAL/PLATELET
Basophils Absolute: 0 10*3/uL (ref 0.0–0.1)
Basophils Relative: 0 %
Eosinophils Absolute: 0.2 10*3/uL (ref 0.0–0.7)
Eosinophils Relative: 1 %
HEMATOCRIT: 38.7 % — AB (ref 39.0–52.0)
HEMOGLOBIN: 13.2 g/dL (ref 13.0–17.0)
LYMPHS PCT: 27 %
Lymphs Abs: 5 10*3/uL — ABNORMAL HIGH (ref 0.7–4.0)
MCH: 29.1 pg (ref 26.0–34.0)
MCHC: 34.1 g/dL (ref 30.0–36.0)
MCV: 85.2 fL (ref 78.0–100.0)
Monocytes Absolute: 2.2 10*3/uL — ABNORMAL HIGH (ref 0.1–1.0)
Monocytes Relative: 12 %
NEUTROS ABS: 11.1 10*3/uL — AB (ref 1.7–7.7)
Neutrophils Relative %: 60 %
Platelets: 257 10*3/uL (ref 150–400)
RBC: 4.54 MIL/uL (ref 4.22–5.81)
RDW: 12.8 % (ref 11.5–15.5)
WBC: 18.5 10*3/uL — ABNORMAL HIGH (ref 4.0–10.5)

## 2016-10-22 LAB — C DIFFICILE QUICK SCREEN W PCR REFLEX
C DIFFICILE (CDIFF) INTERP: DETECTED
C DIFFICLE (CDIFF) ANTIGEN: POSITIVE — AB
C Diff toxin: POSITIVE — AB

## 2016-10-22 LAB — LACTIC ACID, PLASMA: Lactic Acid, Venous: 0.7 mmol/L (ref 0.5–1.9)

## 2016-10-22 LAB — INFLUENZA PANEL BY PCR (TYPE A & B)
INFLAPCR: NEGATIVE
Influenza B By PCR: NEGATIVE

## 2016-10-22 LAB — GLUCOSE, CAPILLARY
GLUCOSE-CAPILLARY: 81 mg/dL (ref 65–99)
GLUCOSE-CAPILLARY: 89 mg/dL (ref 65–99)

## 2016-10-22 MED ORDER — ZOLPIDEM TARTRATE 5 MG PO TABS
5.0000 mg | ORAL_TABLET | Freq: Every evening | ORAL | Status: DC | PRN
  Administered 2016-10-22 – 2016-10-25 (×3): 5 mg via ORAL
  Filled 2016-10-22 (×4): qty 1

## 2016-10-22 MED ORDER — HYDROMORPHONE HCL 2 MG/ML IJ SOLN
0.5000 mg | INTRAMUSCULAR | Status: DC | PRN
  Administered 2016-10-22 – 2016-10-24 (×16): 0.5 mg via INTRAVENOUS
  Filled 2016-10-22 (×16): qty 1

## 2016-10-22 MED ORDER — KCL IN DEXTROSE-NACL 20-5-0.9 MEQ/L-%-% IV SOLN
INTRAVENOUS | Status: DC
  Administered 2016-10-22: 08:00:00 via INTRAVENOUS
  Filled 2016-10-22 (×2): qty 1000

## 2016-10-22 MED ORDER — SACCHAROMYCES BOULARDII 250 MG PO CAPS
250.0000 mg | ORAL_CAPSULE | Freq: Two times a day (BID) | ORAL | Status: DC
  Administered 2016-10-22 – 2016-10-25 (×7): 250 mg via ORAL
  Filled 2016-10-22 (×7): qty 1

## 2016-10-22 MED ORDER — IOPAMIDOL (ISOVUE-300) INJECTION 61%
100.0000 mL | Freq: Once | INTRAVENOUS | Status: AC | PRN
  Administered 2016-10-22: 100 mL via INTRAVENOUS

## 2016-10-22 MED ORDER — CIPROFLOXACIN HCL 500 MG PO TABS
500.0000 mg | ORAL_TABLET | Freq: Once | ORAL | Status: AC
  Administered 2016-10-22: 500 mg via ORAL
  Filled 2016-10-22: qty 1

## 2016-10-22 MED ORDER — METRONIDAZOLE 500 MG PO TABS
500.0000 mg | ORAL_TABLET | Freq: Once | ORAL | Status: AC
  Administered 2016-10-22: 500 mg via ORAL
  Filled 2016-10-22: qty 1

## 2016-10-22 MED ORDER — MORPHINE SULFATE (PF) 2 MG/ML IV SOLN
2.0000 mg | Freq: Once | INTRAVENOUS | Status: AC
  Administered 2016-10-22: 2 mg via INTRAVENOUS
  Filled 2016-10-22: qty 1

## 2016-10-22 MED ORDER — ACETAMINOPHEN 650 MG RE SUPP: 650.0000 mg | Freq: Four times a day (QID) | RECTAL | Status: DC | PRN

## 2016-10-22 MED ORDER — HYDROMORPHONE HCL 2 MG/ML IJ SOLN
0.5000 mg | INTRAMUSCULAR | Status: DC | PRN
  Administered 2016-10-22: 0.5 mg via INTRAVENOUS
  Filled 2016-10-22: qty 1

## 2016-10-22 MED ORDER — SODIUM CHLORIDE 0.9 % IV BOLUS (SEPSIS)
1000.0000 mL | Freq: Once | INTRAVENOUS | Status: AC
  Administered 2016-10-22: 1000 mL via INTRAVENOUS

## 2016-10-22 MED ORDER — ONDANSETRON HCL 4 MG/2ML IJ SOLN
4.0000 mg | Freq: Once | INTRAMUSCULAR | Status: AC
  Administered 2016-10-22: 4 mg via INTRAVENOUS
  Filled 2016-10-22: qty 2

## 2016-10-22 MED ORDER — ONDANSETRON HCL 4 MG/2ML IJ SOLN
4.0000 mg | Freq: Four times a day (QID) | INTRAMUSCULAR | Status: DC | PRN
  Administered 2016-10-25: 4 mg via INTRAVENOUS
  Filled 2016-10-22: qty 2

## 2016-10-22 MED ORDER — ONDANSETRON HCL 4 MG PO TABS: 4.0000 mg | ORAL_TABLET | Freq: Four times a day (QID) | ORAL | Status: DC | PRN

## 2016-10-22 MED ORDER — VANCOMYCIN 50 MG/ML ORAL SOLUTION
125.0000 mg | Freq: Four times a day (QID) | ORAL | Status: DC
  Administered 2016-10-22 – 2016-10-25 (×13): 125 mg via ORAL
  Filled 2016-10-22 (×15): qty 2.5

## 2016-10-22 MED ORDER — ACETAMINOPHEN 325 MG PO TABS: 650.0000 mg | ORAL_TABLET | Freq: Four times a day (QID) | ORAL | Status: DC | PRN

## 2016-10-22 MED ORDER — MORPHINE SULFATE (PF) 4 MG/ML IV SOLN
4.0000 mg | Freq: Once | INTRAVENOUS | Status: AC
  Administered 2016-10-22: 4 mg via INTRAVENOUS
  Filled 2016-10-22: qty 1

## 2016-10-22 MED ORDER — METRONIDAZOLE IN NACL 5-0.79 MG/ML-% IV SOLN
500.0000 mg | Freq: Three times a day (TID) | INTRAVENOUS | Status: DC
  Administered 2016-10-22: 500 mg via INTRAVENOUS
  Filled 2016-10-22: qty 100

## 2016-10-22 MED ORDER — POTASSIUM CHLORIDE 2 MEQ/ML IV SOLN
INTRAVENOUS | Status: DC
  Administered 2016-10-22 – 2016-10-23 (×3): via INTRAVENOUS
  Filled 2016-10-22 (×6): qty 1000

## 2016-10-22 MED ORDER — CIPROFLOXACIN IN D5W 400 MG/200ML IV SOLN
400.0000 mg | Freq: Two times a day (BID) | INTRAVENOUS | Status: DC
  Filled 2016-10-22: qty 200

## 2016-10-22 NOTE — Progress Notes (Signed)
Initial Nutrition Assessment  DOCUMENTATION CODES:   Not applicable  INTERVENTION:    Advance diet as medically appropriate, add interventions accordingly  NUTRITION DIAGNOSIS:   Inadequate oral intake related to inability to eat as evidenced by NPO status  GOAL:   Patient will meet greater than or equal to 90% of their needs  MONITOR:   Diet advancement, PO intake, Labs, Weight trends, I & O's  REASON FOR ASSESSMENT:   Malnutrition Screening Tool  ASSESSMENT:   26 yo Male with history of ulcerative colitis (seen by GI in [place]-last visit a year ago), not on maintenance medications, apparently gets a bad flare up to 3 times per year mostly precipitated by antibiotics, [facility] a week prior to admission and told to have sinus infection/bronchitis and? Bloodstream infection and discharged on Augmentin, had transient diarrhea week prior to admission which resolved but then 2 days prior to admission, noted worsening nonbloody diarrhea, abdominal pain, nausea without vomiting and fevers (up to 102F at home) and presented to the ED.  Pt reports the last time he ate was Sun, 1/14 evening. Was eating solid food prior to this >> currently NPO. States he's lost about 10 lbs in the last week. Would benefit from oral nutrition supplements. No muscle or subcutaneous fat depletion noticed.  Diet Order:  Diet NPO time specified Except for: Ice Chips, Sips with Meds  Skin:  Reviewed, no issues  Last BM:  1/16  Height:   Ht Readings from Last 1 Encounters:  10/21/16 6' (1.829 m)    Weight:   Wt Readings from Last 1 Encounters:  10/21/16 175 lb (79.4 kg)    Ideal Body Weight:  81 kg  BMI:  Body mass index is 23.73 kg/m.  Estimated Nutritional Needs:   Kcal:  2000-2200  Protein:  100-110 gm  Fluid:  2.0-2.2 L  EDUCATION NEEDS:   No education needs identified at this time  Maureen ChattersKatie Daveda Larock, RD, LDN Pager #: [phone] After-Hours Pager #:  [phone]

## 2016-10-22 NOTE — Progress Notes (Signed)
26 year old M previously healthy presents with diarrhea, cramps.    Initially had URI sx 1 week, was seen somewhere a few d ago and started on Augmentin, improving. But then now has developed severe abdominal pain and diarrhea, and so came to ER.  Temp initially 101F. BP 130/86   Pulse 76   Temp 98.7 F (37.1 C) (Oral)   Resp 20   Ht 6' (1.829 m)   Wt 79.4 kg (175 lb)   SpO2 97%   BMI 23.73 kg/m    Na 135, K 3.3, Cr 0.85, WBC 20.5K, Hgb 14.6 Lactate 0.56 Flu pending  CT abdomen pelvis showed no appendicitis but ileitis and mild pancolitis. He describes a vague history of "had colitis before, got colonoscopy, no Crohn's disease".   On Cipro/Flagyl.  To med surg OBS for IVF overnight.

## 2016-10-22 NOTE — Progress Notes (Signed)
Pt arrived floor. [facility] patient placement was notified. MD was allocated to the pt. Will continue to monitor.

## 2016-10-22 NOTE — ED Provider Notes (Signed)
Pt signed out by Dr. Eudelia Bunchardama.  He is still in a lot of pain and looks very uncomfortable.  He will be given cipro and flagyl and additional IVFs.  He was d/w Dr. Maryfrances Bunnellanford ([facility]) for admission.   Jacalyn LefevreJulie Anish Vana, MD 10/22/16 [phone]

## 2016-10-22 NOTE — Progress Notes (Signed)
Pharmacy Antibiotic Note  Ann HeldCody Doi is a 26 y.o. male admitted on 10/21/2016 with mild colitis.  Pharmacy has been consulted for Cipro dosing.  Plan: Cipro 400mg  IV Q12H. Consider changing Flagyl to PO 2/2 mild case (per CT) and IV has low availability.  Height: 6' (182.9 cm) Weight: 175 lb (79.4 kg) IBW/kg (Calculated) : 77.6  Temp (24hrs), Avg:98.9 F (37.2 C), Min:97.8 F (36.6 C), Max:101 F (38.3 C)   Recent Labs Lab 10/21/16 2049 10/21/16 2226  WBC 20.5*  --   CREATININE 0.85  --   LATICACIDVEN  --  0.56    Estimated Creatinine Clearance: 145.8 mL/min (by C-G formula based on SCr of 0.85 mg/dL).    No Known Allergies   Thank you for allowing pharmacy to be a part of this patient's care.  Vernard GamblesVeronda Asiya Cutbirth, PharmD, BCPS  10/22/2016 6:42 AM

## 2016-10-22 NOTE — Progress Notes (Signed)
Addendum  Pharmacist was able to obtain information from OSH: Only had a flu swab that was negative and no other cultures were drawn at [facility] on 1/7 or 1/15.  C. difficile testing positive. Discontinue all prior antibiotics. Started oral vancomycin and probiotics.  Marcellus ScottHONGALGI,ANAND, MD, FACP, FHM. [facility] Pager [phone]  If 7PM-7AM, please contact night-coverage [email or web address] Password [email or web address] 10/22/2016, 12:30 PM

## 2016-10-22 NOTE — H&P (Signed)
History and Physical    Nathan Daugherty ZOX:[id number] DOB: 09-06-1991 DOA: 10/21/2016  PCP: Dema Severin, NP  Patient coming from: Home.  Chief Complaint: Abdominal pain.  HPI: Nathan Daugherty is a 26 y.o. male with history of ulcerative colitis (as per the patient diagnosed last year at [facility]) not on any medications presents to the ER because of abdominal pain with diarrhea. Patient states last week he has been having upper respiratory tract symptoms and was on Augmentin for last few days. Last Wednesday (6 days ago) he started developing diarrhea nonbloody. Since last evening patient started experiencing abdominal pain mostly in the lower quadrants with 1 episode of nausea vomiting. Abdominal pain is constant and sometimes crampy in nature. In the ER CT abdomen and pelvis shows colitis involving the distal ileum ascending transverse and descending. No definite evidence of appendicitis. Patient is being admitted for further management of colitis. On exam patient has diffuse tenderness.  ED Course: Patient was started on empiric antibiotics. Labs revealed leukocytosis.  Review of Systems: As per HPI, rest all negative.   Past Medical History:  Diagnosis Date  . IBD (inflammatory bowel disease)     Past Surgical History:  Procedure Laterality Date  . HYPOSPADIAS CORRECTION       reports that he has been smoking Cigarettes.  He has been smoking about 0.50 packs per day. He has never used smokeless tobacco. He reports that he drinks alcohol. He reports that he does not use drugs.  No Known Allergies  Family History  Problem Relation Age of Onset  . Breast cancer Mother   . Diabetes Mellitus II Maternal Grandmother   . Breast cancer Maternal Grandmother   . Lung cancer Maternal Grandfather     Prior to Admission medications   Medication Sig Start Date End Date Taking? Authorizing Provider  cetirizine (ZYRTEC) 10 MG tablet Take 10 mg by mouth daily.    Historical Provider, MD    fluticasone (FLONASE) 50 MCG/ACT nasal spray Place 2 sprays into both nostrils daily.    Historical Provider, MD  HYDROcodone-acetaminophen (NORCO/VICODIN) 5-325 MG per tablet Take 2 tablets by mouth every 4 (four) hours as needed. 12/09/13   [name], MD  ibuprofen (ADVIL,MOTRIN) 200 MG tablet Take 400 mg by mouth every 6 (six) hours as needed.    Historical Provider, MD  ibuprofen (ADVIL,MOTRIN) 800 MG tablet Take 800 mg by mouth 3 (three) times daily.    Historical Provider, MD  methocarbamol (ROBAXIN) 500 MG tablet Take 500 mg by mouth 3 (three) times daily.    Historical Provider, MD  ondansetron (ZOFRAN) 4 MG tablet Take 1 tablet (4 mg total) by mouth every 6 (six) hours. 12/09/13   [name], MD  oxyCODONE-acetaminophen (PERCOCET) 5-325 MG per tablet Take 2 tablets by mouth every 6 (six) hours as needed. 08/17/14   [name], MD  PRESCRIPTION MEDICATION Cortizone Shots in buttocks at companies dr's office    Historical Provider, MD    Physical Exam: Vitals:   10/22/16 0150 10/22/16 0320 10/22/16 0410 10/22/16 0538  BP: 118/59 117/69 129/78 (!) 114/55  Pulse: 74 80 75 72  Resp: 19 20 18 20   Temp: 97.8 F (36.6 C) 98.2 F (36.8 C) 99.1 F (37.3 C) 98.4 F (36.9 C)  TempSrc: Oral Oral Oral   SpO2: 98% 98% 97% 99%  Weight:      Height:          Constitutional: Moderately built and nourished. Vitals:  10/22/16 0150 10/22/16 0320 10/22/16 0410 10/22/16 0538  BP: 118/59 117/69 129/78 (!) 114/55  Pulse: 74 80 75 72  Resp: 19 20 18 20   Temp: 97.8 F (36.6 C) 98.2 F (36.8 C) 99.1 F (37.3 C) 98.4 F (36.9 C)  TempSrc: Oral Oral Oral   SpO2: 98% 98% 97% 99%  Weight:      Height:       Eyes: Anicteric. No pallor. ENMT: No discharge from the ears eyes nose and mouth. Neck: No mass felt. No neck rigidity. No JVD appreciated. Respiratory: No rhonchi or crepitations. Cardiovascular: S1 and S2 heard. No murmurs appreciated. Abdomen: Diffuse tenderness no guarding  or rigidity. Bowel sounds appreciated. Musculoskeletal: No edema. Skin: No rash. Neurologic: Alert awake oriented to time place and person. Moves all extremities. Psychiatric: Appears normal. Normal affect.   Labs on Admission: I have personally reviewed following labs and imaging studies  CBC:  Recent Labs Lab 10/21/16 2049  WBC 20.5*  NEUTROABS 14.4*  HGB 14.6  HCT 41.6  MCV 83.5  PLT 307   Basic Metabolic Panel:  Recent Labs Lab 10/21/16 2049  NA 135  K 3.3*  CL 105  CO2 21*  GLUCOSE 104*  BUN 6  CREATININE 0.85  CALCIUM 9.3   GFR: Estimated Creatinine Clearance: 145.8 mL/min (by C-G formula based on SCr of 0.85 mg/dL). Liver Function Tests: No results for input(s): AST, ALT, ALKPHOS, BILITOT, PROT, ALBUMIN in the last 168 hours. No results for input(s): LIPASE, AMYLASE in the last 168 hours. No results for input(s): AMMONIA in the last 168 hours. Coagulation Profile: No results for input(s): INR, PROTIME in the last 168 hours. Cardiac Enzymes: No results for input(s): CKTOTAL, CKMB, CKMBINDEX, TROPONINI in the last 168 hours. BNP (last 3 results) No results for input(s): PROBNP in the last 8760 hours. HbA1C: No results for input(s): HGBA1C in the last 72 hours. CBG: No results for input(s): GLUCAP in the last 168 hours. Lipid Profile: No results for input(s): CHOL, HDL, LDLCALC, TRIG, CHOLHDL, LDLDIRECT in the last 72 hours. Thyroid Function Tests: No results for input(s): TSH, T4TOTAL, FREET4, T3FREE, THYROIDAB in the last 72 hours. Anemia Panel: No results for input(s): VITAMINB12, FOLATE, FERRITIN, TIBC, IRON, RETICCTPCT in the last 72 hours. Urine analysis:    Component Value Date/Time   COLORURINE YELLOW 10/21/2016 2041   APPEARANCEUR CLEAR 10/21/2016 2041   LABSPEC 1.014 10/21/2016 2041   PHURINE 7.0 10/21/2016 2041   GLUCOSEU NEGATIVE 10/21/2016 2041   HGBUR MODERATE (A) 10/21/2016 2041   BILIRUBINUR NEGATIVE 10/21/2016 2041   KETONESUR 15  (A) 10/21/2016 2041   PROTEINUR NEGATIVE 10/21/2016 2041   UROBILINOGEN 0.2 12/09/2013 0330   NITRITE NEGATIVE 10/21/2016 2041   LEUKOCYTESUR TRACE (A) 10/21/2016 2041   Sepsis Labs: @LABRCNTIP (procalcitonin:4,lacticidven:4) )No results found for this or any previous visit (from the past 240 hour(s)).   Radiological Exams on Admission: Dg Chest 2 View  Result Date: 10/21/2016 CLINICAL DATA:  Shortness of breath, chest pain, fever, chills for 1 week. EXAM: CHEST  2 VIEW COMPARISON:  Chest radiograph October 14, 2015 FINDINGS: Cardiomediastinal silhouette is normal. No pleural effusions or focal consolidations. Trachea projects midline and there is no pneumothorax. Soft tissue planes and included osseous structures are non-suspicious. IMPRESSION: Normal chest. Electronically Signed   By: Awilda Metro M.D.   On: 10/21/2016 22:22   Ct Abdomen Pelvis W Contrast  Result Date: 10/22/2016 CLINICAL DATA:  Right lower quadrant pain EXAM: CT ABDOMEN AND PELVIS WITH CONTRAST  TECHNIQUE: Multidetector CT imaging of the abdomen and pelvis was performed using the standard protocol following bolus administration of intravenous contrast. CONTRAST:  100mL ISOVUE-300 IOPAMIDOL (ISOVUE-300) INJECTION 61% COMPARISON:  07/19/2016, 11/19/2015 FINDINGS: Lower chest: Lung bases demonstrate no acute consolidation or effusion. Normal heart size Hepatobiliary: No focal liver abnormality is seen. No gallstones, gallbladder wall thickening, or biliary dilatation. Pancreas: Unremarkable. No pancreatic ductal dilatation or surrounding inflammatory changes. Spleen: Normal in size without focal abnormality. Adrenals/Urinary Tract: Adrenal glands are unremarkable. Kidneys are normal, without renal calculi, focal lesion, or hydronephrosis. Bladder is unremarkable. Stomach/Bowel: Stomach nonenlarged.  No dilated small bowel. There is thickening of the terminal ileum and the ileocecal valve. There is wall thickening of the cecum and  ascending colon with additional wall thickening of the transverse colon, descending colon. The sigmoid colon and rectum are under distended. The appendix also appears thickened but is non dilated. No soft tissue stranding is present around the appendix. Vascular/Lymphatic: Multiple prominent right lower quadrant mesenteric lymph nodes. Non aneurysmal aorta Reproductive: Prostate is unremarkable. Other: No free air or free fluid Musculoskeletal: No acute or significant osseous findings. IMPRESSION: 1. Thickening of the terminal ileum, cecum, ascending colon, transverse colon and descending colon consistent with a mild colitis. This may be of infectious or inflammatory etiology. There is mild wall thickening of the appendix however appendix is nonenlarged and there is no surrounding inflammatory change to suggest an acute appendicitis. 2. Right lower quadrant mesenteric lymph nodes Electronically Signed   By: Jasmine PangKim  Fujinaga M.D.   On: 10/22/2016 01:26    Assessment/Plan Principal Problem:   Colitis Active Problems:   Hypokalemia    1. Diffuse colitis - differentials include inflammatory versus infectious. Check stool studies including GI pathogen panel and C. difficile since patient was recently on antibiotics. I will keep patient nothing by mouth for now since patient has significant abdominal pain. Please consult gastroenterologist in a.m. Patient will be continued on Flagyl and Cipro. . Recheck labs including lactate CBC and metabolic panel. 2. Mild hypokalemia - replace and recheck. 3. Recent URI - influenza PCR is negative discontinue droplet precautions.   DVT prophylaxis: SCDs. Code Status: Full code.  Family Communication: Discussed patient's dad.  Disposition Plan: Home.  Consults called: None.  Admission status: Observation.    Eduard ClosKAKRAKANDY,Zannie Locastro N. MD [facility] Pager [phone].  If 7PM-7AM, please contact night-coverage [email or web address] Password [email or web address]  10/22/2016, 6:39 AM

## 2016-10-22 NOTE — Consult Note (Signed)
Reason for Consult: Ulcerative Colitis and C. Diff colitis. Referring Physician: [facility]  [place] HPI: This is a 26 year old male with a PMH of colitis diagnosed last year at [facility] who is admitted for complaints of abdominal pain and diarrhea.  The symptoms started shortly after being treated for an upper respiratory tract infection with Augmentin.  As a result he started to have issues with diarrhea, but there was not report of any blood.  His symptoms continued to persist and worsen, which prompted him to present to the ER.  A CT scan of the ABM/pelvis identified a pancolitis with some inflammation in the TI.  Stool studies were obtained and he was found to be positive for C. Diff.  His antibiotic regimen of Cipro and Flagyl was changed over to vancomycin today.    Past Medical History:  Diagnosis Date  . IBD (inflammatory bowel disease)     Past Surgical History:  Procedure Laterality Date  . HYPOSPADIAS CORRECTION      Family History  Problem Relation Age of Onset  . Breast cancer Mother   . Diabetes Mellitus II Maternal Grandmother   . Breast cancer Maternal Grandmother   . Lung cancer Maternal Grandfather     Social History:  reports that he has been smoking Cigarettes.  He has been smoking about 0.50 packs per day. He has never used smokeless tobacco. He reports that he drinks alcohol. He reports that he does not use drugs.  Allergies: No Known Allergies  Medications:  Scheduled: . saccharomyces boulardii  250 mg Oral BID  . vancomycin  125 mg Oral QID   Continuous: . dextrose 5 % and 0.9% NaCl 1,000 mL with potassium chloride 40 mEq infusion 125 mL/hr at 10/22/16 1231    Results for orders placed or performed during the hospital encounter of 10/21/16 (from the past 24 hour(s))  Urinalysis, Routine w reflex microscopic     Status: Abnormal   Collection Time: 10/21/16  8:41 PM  Result Value Ref Range   Color, Urine YELLOW YELLOW    APPearance CLEAR CLEAR   Specific Gravity, Urine 1.014 1.005 - 1.030   pH 7.0 5.0 - 8.0   Glucose, UA NEGATIVE NEGATIVE mg/dL   Hgb urine dipstick MODERATE (A) NEGATIVE   Bilirubin Urine NEGATIVE NEGATIVE   Ketones, ur 15 (A) NEGATIVE mg/dL   Protein, ur NEGATIVE NEGATIVE mg/dL   Nitrite NEGATIVE NEGATIVE   Leukocytes, UA TRACE (A) NEGATIVE  Urinalysis, Microscopic (reflex)     Status: Abnormal   Collection Time: 10/21/16  8:41 PM  Result Value Ref Range   RBC / HPF 6-30 0 - 5 RBC/hpf   WBC, UA 0-5 0 - 5 WBC/hpf   Bacteria, UA RARE (A) NONE SEEN   Squamous Epithelial / LPF 0-5 (A) NONE SEEN   Mucous PRESENT   CBC with Differential     Status: Abnormal   Collection Time: 10/21/16  8:49 PM  Result Value Ref Range   WBC 20.5 (H) 4.0 - 10.5 K/uL   RBC 4.98 4.22 - 5.81 MIL/uL   Hemoglobin 14.6 13.0 - 17.0 g/dL   HCT 16.1 09.6 - 04.5 %   MCV 83.5 78.0 - 100.0 fL   MCH 29.3 26.0 - 34.0 pg   MCHC 35.1 30.0 - 36.0 g/dL   RDW 40.9 81.1 - 91.4 %   Platelets 307 150 - 400 K/uL   Neutrophils Relative % 70 %   Neutro Abs 14.4 (H) 1.7 -  7.7 K/uL   Lymphocytes Relative 17 %   Lymphs Abs 3.4 0.7 - 4.0 K/uL   Monocytes Relative 12 %   Monocytes Absolute 2.5 (H) 0.1 - 1.0 K/uL   Eosinophils Relative 1 %   Eosinophils Absolute 0.1 0.0 - 0.7 K/uL   Basophils Relative 0 %   Basophils Absolute 0.0 0.0 - 0.1 K/uL  Basic metabolic panel     Status: Abnormal   Collection Time: 10/21/16  8:49 PM  Result Value Ref Range   Sodium 135 135 - 145 mmol/L   Potassium 3.3 (L) 3.5 - 5.1 mmol/L   Chloride 105 101 - 111 mmol/L   CO2 21 (L) 22 - 32 mmol/L   Glucose, Bld 104 (H) 65 - 99 mg/dL   BUN 6 6 - 20 mg/dL   Creatinine, Ser 1.610.85 0.61 - 1.24 mg/dL   Calcium 9.3 8.9 - 09.610.3 mg/dL   GFR calc non Af Amer >60 >60 mL/min   GFR calc Af Amer >60 >60 mL/min   Anion gap 9 5 - 15  I-Stat CG4 Lactic Acid, ED     Status: None   Collection Time: 10/21/16 10:26 PM  Result Value Ref Range   Lactic Acid,  Venous 0.56 0.5 - 1.9 mmol/L  Influenza panel by PCR (type A & B)     Status: None   Collection Time: 10/21/16 11:40 PM  Result Value Ref Range   Influenza A By PCR NEGATIVE NEGATIVE   Influenza B By PCR NEGATIVE NEGATIVE  CBC with Differential/Platelet     Status: Abnormal   Collection Time: 10/22/16  5:42 AM  Result Value Ref Range   WBC 18.5 (H) 4.0 - 10.5 K/uL   RBC 4.54 4.22 - 5.81 MIL/uL   Hemoglobin 13.2 13.0 - 17.0 g/dL   HCT 04.538.7 (L) 40.939.0 - 81.152.0 %   MCV 85.2 78.0 - 100.0 fL   MCH 29.1 26.0 - 34.0 pg   MCHC 34.1 30.0 - 36.0 g/dL   RDW 91.412.8 78.211.5 - 95.615.5 %   Platelets 257 150 - 400 K/uL   Neutrophils Relative % 60 %   Lymphocytes Relative 27 %   Monocytes Relative 12 %   Eosinophils Relative 1 %   Basophils Relative 0 %   Neutro Abs 11.1 (H) 1.7 - 7.7 K/uL   Lymphs Abs 5.0 (H) 0.7 - 4.0 K/uL   Monocytes Absolute 2.2 (H) 0.1 - 1.0 K/uL   Eosinophils Absolute 0.2 0.0 - 0.7 K/uL   Basophils Absolute 0.0 0.0 - 0.1 K/uL   RBC Morphology TEARDROP CELLS   Lactic acid, plasma     Status: None   Collection Time: 10/22/16  5:42 AM  Result Value Ref Range   Lactic Acid, Venous 0.7 0.5 - 1.9 mmol/L  C difficile quick scan w PCR reflex     Status: Abnormal   Collection Time: 10/22/16 11:04 AM  Result Value Ref Range   C Diff antigen POSITIVE (A) NEGATIVE   C Diff toxin POSITIVE (A) NEGATIVE   C Diff interpretation Toxin producing C. difficile detected.   Glucose, capillary     Status: None   Collection Time: 10/22/16 12:16 PM  Result Value Ref Range   Glucose-Capillary 81 65 - 99 mg/dL     Dg Chest 2 View  Result Date: 10/21/2016 CLINICAL DATA:  Shortness of breath, chest pain, fever, chills for 1 week. EXAM: CHEST  2 VIEW COMPARISON:  Chest radiograph October 14, 2015 FINDINGS: Cardiomediastinal silhouette is normal.  No pleural effusions or focal consolidations. Trachea projects midline and there is no pneumothorax. Soft tissue planes and included osseous structures are  non-suspicious. IMPRESSION: Normal chest. Electronically Signed   By: Awilda Metro M.D.   On: 10/21/2016 22:22   Ct Abdomen Pelvis W Contrast  Result Date: 10/22/2016 CLINICAL DATA:  Right lower quadrant pain EXAM: CT ABDOMEN AND PELVIS WITH CONTRAST TECHNIQUE: Multidetector CT imaging of the abdomen and pelvis was performed using the standard protocol following bolus administration of intravenous contrast. CONTRAST:  ISOVUE-300 IOPAMIDOL (ISOVUE-300) INJECTION 61% COMPARISON:  07/19/2016, 11/19/2015 FINDINGS: Lower chest: Lung bases demonstrate no acute consolidation or effusion. Normal heart size Hepatobiliary: No focal liver abnormality is seen. No gallstones, gallbladder wall thickening, or biliary dilatation. Pancreas: Unremarkable. No pancreatic ductal dilatation or surrounding inflammatory changes. Spleen: Normal in size without focal abnormality. Adrenals/Urinary Tract: Adrenal glands are unremarkable. Kidneys are normal, without renal calculi, focal lesion, or hydronephrosis. Bladder is unremarkable. Stomach/Bowel: Stomach nonenlarged.  No dilated small bowel. There is thickening of the terminal ileum and the ileocecal valve. There is wall thickening of the cecum and ascending colon with additional wall thickening of the transverse colon, descending colon. The sigmoid colon and rectum are under distended. The appendix also appears thickened but is non dilated. No soft tissue stranding is present around the appendix. Vascular/Lymphatic: Multiple prominent right lower quadrant mesenteric lymph nodes. Non aneurysmal aorta Reproductive: Prostate is unremarkable. Other: No free air or free fluid Musculoskeletal: No acute or significant osseous findings. IMPRESSION: 1. Thickening of the terminal ileum, cecum, ascending colon, transverse colon and descending colon consistent with a mild colitis. This may be of infectious or inflammatory etiology. There is mild wall thickening of the appendix however  appendix is nonenlarged and there is no surrounding inflammatory change to suggest an acute appendicitis. 2. Right lower quadrant mesenteric lymph nodes Electronically Signed   By: Jasmine Pang M.D.   On: 10/22/2016 01:26    ROS:  As stated above in the HPI otherwise negative.  Blood pressure 113/63, pulse (!) 54, temperature 98.2 F (36.8 C), temperature source Oral, resp. rate 18, height 6' (1.829 m), weight 79.4 kg (175 lb), SpO2 96 %.    PE: Gen: NAD, Alert and Oriented HEENT:  [place]/AT, EOMI Neck: Supple, no LAD Lungs: CTA Bilaterally CV: RRR without M/G/R ABM: Soft, diffusely tender, +BS Ext: No C/C/E  Assessment/Plan: 1) C. Diff colitis. 2) ? Ulcerative Colitis. 3) ABM pain.   It is very common to see C. Diff infections in IBD, especially UC.  The C. Diff infection can also cause a flare of UC and only time will tell to see how he responds to vancomycin only.  If he has persistent symptoms in spite of vancomycin, then initiation of steroids is required.  However, with further questioning, I am not certain if he has UC  He states that the GI physician did not continue him on any medication and he was only treated for the 5 days that he was in the hospital.    Plan: 1) Continue with vancomycin.  During my evaluation at this time, I did notify nursing to stop the Cipro infusion.  I am not certain why it was being infused, even though the order reported that the infusion was complete and not reordered. 2) Follow clinically. 3) Try to obtain records from [facility].  Cordney Barstow D 10/22/2016, 5:55 PM

## 2016-10-22 NOTE — Progress Notes (Signed)
PROGRESS NOTE  Nathan Daugherty  VQQ:[id number] DOB: 1991/08/21  DOA: 10/21/2016 PCP: Dema Severin, NP   Brief Narrative:  26 year old male with history of ulcerative colitis (seen by GI in [place]-last visit a year ago), not on maintenance medications, apparently gets a bad flare up to 3 times per year mostly precipitated by antibiotics, seen in [facility] ED a week prior to admission and told to have sinus infection/bronchitis and? Bloodstream infection and discharged on Augmentin, had transient diarrhea week prior to admission which resolved but then 2 days prior to admission, noted worsening nonbloody diarrhea, abdominal pain, nausea without vomiting and fevers (up to 102F at home) and presented to the ED. CT abdomen confirmed pancolitis. Infectious versus inflammatory. Empirically started on IV Cipro and Flagyl and admitted for evaluation. GI consulted. Requested medical records from OSH.    Assessment & Plan:   Principal Problem:   Colitis Active Problems:   Hypokalemia   1. Pancolitis in patient with history of ulcerative colitis: See's GI in [place]. Not on maintenance medication. Recent exposure to Augmentin. CT abdomen confirms pancolitis, mild. DD: Infectious versus inflammatory. Treating with bowel rest, empiric IV Cipro and Flagyl were started and pain management. C. difficile and GI pathogen panel PCR pending. GI/Dr. Elnoria Howard consulted and recommended discontinuing Cipro for now until his evaluation. Lactate normal. 2. Leukocytosis: Secondary to problem #1. Management as above. Follow CBCs. 3. Hypokalemia: Replace and follow. 4. Recent URI: Patient states that he was recently tested for flu 2 and negative and refuses being retested. Chest x-ray negative. Patient reports "bloodstream infection"-[facility] is not on care everywhere. Requesting medical records from OSH & [facility] Pharmacist will discuss with peer at OSH.   DVT prophylaxis: SCDs Code Status: Full Family  Communication: None at bedside Disposition Plan: DC home when medically stable.   Consultants:   GI/Dr. Elnoria Howard  Procedures:   None  Antimicrobials:   IV Cipro-discontinued  IV metronidazole.    Subjective: Feels slightly better. Had couple watery BMs this morning without blood or mucus. No abdominal pain not controlled on current pain regimen. No nausea or vomiting. Fevers improved.  Objective:  Vitals:   10/22/16 0150 10/22/16 0320 10/22/16 0410 10/22/16 0538  BP: 118/59 117/69 129/78 (!) 114/55  Pulse: 74 80 75 72  Resp: 19 20 18 20   Temp: 97.8 F (36.6 C) 98.2 F (36.8 C) 99.1 F (37.3 C) 98.4 F (36.9 C)  TempSrc: Oral Oral Oral   SpO2: 98% 98% 97% 99%  Weight:      Height:        Intake/Output Summary (Last 24 hours) at 10/22/16 1146 Last data filed at 10/22/16 1043  Gross per 24 hour  Intake                0 ml  Output                0 ml  Net                0 ml   Filed Weights   10/21/16 2042  Weight: 79.4 kg (175 lb)    Examination:  General exam: Pleasant young male lying comfortably supine in bed. Does not appear septic or toxic. Respiratory system: Clear to auscultation. Respiratory effort normal. Cardiovascular system: S1 & S2 heard, RRR. No JVD, murmurs, rubs, gallops or clicks. No pedal edema. Gastrointestinal system: Abdomen is nondistended, generalized mild tenderness and guarding without rigidity or rebound. No organomegaly or masses felt. Normal bowel  sounds heard. Central nervous system: Alert and oriented. No focal neurological deficits. Extremities: Symmetric 5 x 5 power. Skin: No rashes, lesions or ulcers Psychiatry: Judgement and insight appear normal. Mood & affect appropriate.     Data Reviewed: I have personally reviewed following labs and imaging studies  CBC:  Recent Labs Lab 10/21/16 2049 10/22/16 0542  WBC 20.5* 18.5*  NEUTROABS 14.4* 11.1*  HGB 14.6 13.2  HCT 41.6 38.7*  MCV 83.5 85.2  PLT 307 257   Basic  Metabolic Panel:  Recent Labs Lab 10/21/16 2049  NA 135  K 3.3*  CL 105  CO2 21*  GLUCOSE 104*  BUN 6  CREATININE 0.85  CALCIUM 9.3   GFR: Estimated Creatinine Clearance: 145.8 mL/min (by C-G formula based on SCr of 0.85 mg/dL). Liver Function Tests: No results for input(s): AST, ALT, ALKPHOS, BILITOT, PROT, ALBUMIN in the last 168 hours. No results for input(s): LIPASE, AMYLASE in the last 168 hours. No results for input(s): AMMONIA in the last 168 hours. Coagulation Profile: No results for input(s): INR, PROTIME in the last 168 hours. Cardiac Enzymes: No results for input(s): CKTOTAL, CKMB, CKMBINDEX, TROPONINI in the last 168 hours. BNP (last 3 results) No results for input(s): PROBNP in the last 8760 hours. HbA1C: No results for input(s): HGBA1C in the last 72 hours. CBG: No results for input(s): GLUCAP in the last 168 hours. Lipid Profile: No results for input(s): CHOL, HDL, LDLCALC, TRIG, CHOLHDL, LDLDIRECT in the last 72 hours. Thyroid Function Tests: No results for input(s): TSH, T4TOTAL, FREET4, T3FREE, THYROIDAB in the last 72 hours. Anemia Panel: No results for input(s): VITAMINB12, FOLATE, FERRITIN, TIBC, IRON, RETICCTPCT in the last 72 hours.  Sepsis Labs:  Recent Labs Lab 10/21/16 2049 10/21/16 2226 10/22/16 0542  WBC 20.5*  --  18.5*  LATICACIDVEN  --  0.56 0.7    No results found for this or any previous visit (from the past 240 hour(s)).       Radiology Studies: Dg Chest 2 View  Result Date: 10/21/2016 CLINICAL DATA:  Shortness of breath, chest pain, fever, chills for 1 week. EXAM: CHEST  2 VIEW COMPARISON:  Chest radiograph October 14, 2015 FINDINGS: Cardiomediastinal silhouette is normal. No pleural effusions or focal consolidations. Trachea projects midline and there is no pneumothorax. Soft tissue planes and included osseous structures are non-suspicious. IMPRESSION: Normal chest. Electronically Signed   By: Awilda Metro M.D.   On:  10/21/2016 22:22   Ct Abdomen Pelvis W Contrast  Result Date: 10/22/2016 CLINICAL DATA:  Right lower quadrant pain EXAM: CT ABDOMEN AND PELVIS WITH CONTRAST TECHNIQUE: Multidetector CT imaging of the abdomen and pelvis was performed using the standard protocol following bolus administration of intravenous contrast. CONTRAST:  ISOVUE-300 IOPAMIDOL (ISOVUE-300) INJECTION 61% COMPARISON:  07/19/2016, 11/19/2015 FINDINGS: Lower chest: Lung bases demonstrate no acute consolidation or effusion. Normal heart size Hepatobiliary: No focal liver abnormality is seen. No gallstones, gallbladder wall thickening, or biliary dilatation. Pancreas: Unremarkable. No pancreatic ductal dilatation or surrounding inflammatory changes. Spleen: Normal in size without focal abnormality. Adrenals/Urinary Tract: Adrenal glands are unremarkable. Kidneys are normal, without renal calculi, focal lesion, or hydronephrosis. Bladder is unremarkable. Stomach/Bowel: Stomach nonenlarged.  No dilated small bowel. There is thickening of the terminal ileum and the ileocecal valve. There is wall thickening of the cecum and ascending colon with additional wall thickening of the transverse colon, descending colon. The sigmoid colon and rectum are under distended. The appendix also appears thickened but is non dilated.  No soft tissue stranding is present around the appendix. Vascular/Lymphatic: Multiple prominent right lower quadrant mesenteric lymph nodes. Non aneurysmal aorta Reproductive: Prostate is unremarkable. Other: No free air or free fluid Musculoskeletal: No acute or significant osseous findings. IMPRESSION: 1. Thickening of the terminal ileum, cecum, ascending colon, transverse colon and descending colon consistent with a mild colitis. This may be of infectious or inflammatory etiology. There is mild wall thickening of the appendix however appendix is nonenlarged and there is no surrounding inflammatory change to suggest an acute  appendicitis. 2. Right lower quadrant mesenteric lymph nodes Electronically Signed   By: Jasmine PangKim  Fujinaga M.D.   On: 10/22/2016 01:26        Scheduled Meds: . metronidazole  500 mg Intravenous Q8H   Continuous Infusions: . dextrose 5 % and 0.9 % NaCl with KCl 20 mEq/L 100 mL/hr at 10/22/16 0823     LOS: 0 days        [facility] Surgery Center Of PaducahNGALGI,Myrah Strawderman, MD [facility] Pager [phone]  If 7PM-7AM, please contact night-coverage [email or web address] Password [facility] [email or web address] 10/22/2016, 11:46 AM

## 2016-10-22 NOTE — Progress Notes (Signed)
MD made aware that pt is refusing to be swabbed for flu.

## 2016-10-22 NOTE — ED Notes (Signed)
Patient transported to CT 

## 2016-10-22 NOTE — Progress Notes (Signed)
Pt refused to be swabbed for flu. Care was handed over to the day RN to follow up.

## 2016-10-22 NOTE — Progress Notes (Signed)
Pt reported being in pain. Eilene GhaziKakarandy, MD was notified. Verbal orders for 2mg  of Morphine was given. Med was given to the pt. Will continue to monitor.

## 2016-10-22 NOTE — Progress Notes (Signed)
CRITICAL VALUE ALERT  Critical value received:  C-diff position  Date of notification:  10/22/2016  Time of notification:  1227  Critical value read back:Yes.    Nurse who received alert:  Danne HarborGlodean Solangel Mcmanaway  MD notified (1st page):  Dr. Waymon AmatoHongalgi  Time of first page:  1228  MD notified (2nd page):  Time of second page:  Responding MD:  Dr. Waymon AmatoHongalgi  Time MD responded:

## 2016-10-23 DIAGNOSIS — A0472 Enterocolitis due to Clostridium difficile, not specified as recurrent: Principal | ICD-10-CM

## 2016-10-23 DIAGNOSIS — E876 Hypokalemia: Secondary | ICD-10-CM

## 2016-10-23 LAB — GASTROINTESTINAL PANEL BY PCR, STOOL (REPLACES STOOL CULTURE)
ADENOVIRUS F40/41: NOT DETECTED
ASTROVIRUS: NOT DETECTED
CAMPYLOBACTER SPECIES: NOT DETECTED
CYCLOSPORA CAYETANENSIS: NOT DETECTED
Cryptosporidium: NOT DETECTED
ENTEROPATHOGENIC E COLI (EPEC): NOT DETECTED
ENTEROTOXIGENIC E COLI (ETEC): NOT DETECTED
Entamoeba histolytica: NOT DETECTED
Enteroaggregative E coli (EAEC): NOT DETECTED
Giardia lamblia: NOT DETECTED
NOROVIRUS GI/GII: NOT DETECTED
PLESIMONAS SHIGELLOIDES: NOT DETECTED
ROTAVIRUS A: NOT DETECTED
SAPOVIRUS (I, II, IV, AND V): NOT DETECTED
SHIGA LIKE TOXIN PRODUCING E COLI (STEC): NOT DETECTED
Salmonella species: NOT DETECTED
Shigella/Enteroinvasive E coli (EIEC): NOT DETECTED
VIBRIO SPECIES: NOT DETECTED
Vibrio cholerae: NOT DETECTED
Yersinia enterocolitica: NOT DETECTED

## 2016-10-23 LAB — COMPREHENSIVE METABOLIC PANEL
ALK PHOS: 75 U/L (ref 38–126)
ALT: 15 U/L — AB (ref 17–63)
AST: 18 U/L (ref 15–41)
Albumin: 3.3 g/dL — ABNORMAL LOW (ref 3.5–5.0)
Anion gap: 7 (ref 5–15)
CALCIUM: 8.9 mg/dL (ref 8.9–10.3)
CO2: 23 mmol/L (ref 22–32)
CREATININE: 0.89 mg/dL (ref 0.61–1.24)
Chloride: 108 mmol/L (ref 101–111)
GFR calc Af Amer: >60 mL/min (ref >60)
Glucose, Bld: 89 mg/dL (ref 65–99)
Potassium: 4 mmol/L (ref 3.5–5.1)
Sodium: 138 mmol/L (ref 135–145)
TOTAL PROTEIN: 6.2 g/dL — AB (ref 6.5–8.1)
Total Bilirubin: 0.4 mg/dL (ref 0.3–1.2)

## 2016-10-23 LAB — CBC
HCT: 40.3 % (ref 39.0–52.0)
Hemoglobin: 13.6 g/dL (ref 13.0–17.0)
MCH: 29.1 pg (ref 26.0–34.0)
MCHC: 33.7 g/dL (ref 30.0–36.0)
MCV: 86.1 fL (ref 78.0–100.0)
PLATELETS: 280 10*3/uL (ref 150–400)
RBC: 4.68 MIL/uL (ref 4.22–5.81)
RDW: 13.1 % (ref 11.5–15.5)
WBC: 12.7 10*3/uL — AB (ref 4.0–10.5)

## 2016-10-23 LAB — GLUCOSE, CAPILLARY: GLUCOSE-CAPILLARY: 87 mg/dL (ref 65–99)

## 2016-10-23 MED ORDER — KCL IN DEXTROSE-NACL 40-5-0.9 MEQ/L-%-% IV SOLN
INTRAVENOUS | Status: AC
  Administered 2016-10-23 – 2016-10-24 (×2): via INTRAVENOUS
  Filled 2016-10-23: qty 1000

## 2016-10-23 MED ORDER — KCL IN DEXTROSE-NACL 40-5-0.9 MEQ/L-%-% IV SOLN
INTRAVENOUS | Status: AC
  Administered 2016-10-23: 09:00:00 via INTRAVENOUS
  Filled 2016-10-23: qty 1000

## 2016-10-23 MED ORDER — POTASSIUM CHLORIDE 2 MEQ/ML IV SOLN: INTRAVENOUS | Status: DC

## 2016-10-23 NOTE — Progress Notes (Signed)
PROGRESS NOTE  Nathan Daugherty  BJY:[id number] DOB: [birth year]  DOA: 10/21/2016 PCP: Dema Severin, NP   Brief Narrative:  26 year old male with history of ulcerative colitis (seen by GI in [place]-last visit a year ago), not on maintenance medications, apparently gets a bad flare up to 3 times per year mostly precipitated by antibiotics, seen in [facility] ED a week prior to admission and told to have sinus infection/bronchitis and? Bloodstream infection and discharged on Augmentin, had transient diarrhea week prior to admission which resolved but then 2 days prior to admission, noted worsening nonbloody diarrhea, abdominal pain, nausea without vomiting and fevers (up to 102F at home) and presented to the ED. CT abdomen confirmed pancolitis. Infectious versus inflammatory. Empirically started on IV Cipro and Flagyl and admitted for evaluation. GI consulted. Confirmed with C. difficile colitis. IBD diagnosis not definitive. This will need further evaluation as outpatient. Improving.    Assessment & Plan:   Principal Problem:   Colitis Active Problems:   Hypokalemia   1. C Diff colitis with ? history of ulcerative colitis: See's GI in [place]. Not on maintenance medication and not sure if he truly has ulcerative colitis. Recent exposure to Augmentin. CT abdomen confirms pancolitis, mild. Lactate normal. Confirmed with C. difficile colitis. Discontinued empirically started IV Cipro and Flagyl. Started oral vancomycin 14 days and probiotics. Clinically improving. Started clear liquids. As per GI consultation and follow-up, not certain if he has diagnosis of ulcerative colitis. He may even have Crohn's disease (history of aphthous ulcers). He will need further evaluation as outpatient. 2. Leukocytosis: Secondary to problem #1. Management as above. Follow CBCs. Improving. 3. Hypokalemia: Replaced 4. Recent URI: Patient states that he was recently tested for flu 2 and negative and refuses  being retested. Chest x-ray negative. Patient reports "bloodstream infection"-[facility] is not on care everywhere. Pharmacist on floor yesterday and today discussed with [facility] and confirmed that they only did a flu screen which was negative. No blood cultures were done.  DVT prophylaxis: SCDs Code Status: Full Family Communication: Discussed with patient's spouse via speaker phone. Disposition Plan: DC home when medically stable.   Consultants:   GI/Dr. Elnoria Howard  Procedures:   None  Antimicrobials:   IV Cipro-discontinued  IV metronidazole.    Subjective: Feels better. Abdominal pain and diarrhea have improved. Wants something to drink.  Objective:  Vitals:   10/22/16 1535 10/22/16 2137 10/23/16 0538 10/23/16 1327  BP: 113/63 (!) 108/58 (!) 96/40 107/64  Pulse: (!) 54 (!) 59 (!) 59 81  Resp: 18 18 17 18   Temp: 98.2 F (36.8 C) 98.5 F (36.9 C) 98.1 F (36.7 C) 98.3 F (36.8 C)  TempSrc: Oral Oral Oral Oral  SpO2: 96% 98% 99% 98%  Weight:      Height:        Intake/Output Summary (Last 24 hours) at 10/23/16 1605 Last data filed at 10/23/16 1116  Gross per 24 hour  Intake           1912.5 ml  Output              750 ml  Net           1162.5 ml   Filed Weights   10/21/16 2042  Weight: 79.4 kg (175 lb)    Examination:  General exam: Pleasant young male lying comfortably supine in bed. Does not appear septic or toxic. Respiratory system: Clear to auscultation. Respiratory effort normal. Cardiovascular system: S1 & S2 heard, RRR.  No JVD, murmurs, rubs, gallops or clicks. No pedal edema. Gastrointestinal system: Abdomen is nondistended, generalized mild tenderness-improved compared to yesterday. No organomegaly or masses felt. Normal bowel sounds heard. Central nervous system: Alert and oriented. No focal neurological deficits. Extremities: Symmetric 5 x 5 power. Skin: No rashes, lesions or ulcers Psychiatry: Judgement and insight appear  normal. Mood & affect appropriate.     Data Reviewed: I have personally reviewed following labs and imaging studies  CBC:  Recent Labs Lab 10/21/16 2049 10/22/16 0542 10/23/16 0551  WBC 20.5* 18.5* 12.7*  NEUTROABS 14.4* 11.1*  --   HGB 14.6 13.2 13.6  HCT 41.6 38.7* 40.3  MCV 83.5 85.2 86.1  PLT 307 257 280   Basic Metabolic Panel:  Recent Labs Lab 10/21/16 2049 10/23/16 0551  NA 135 138  K 3.3* 4.0  CL 105 108  CO2 21* 23  GLUCOSE 104* 89  BUN 6 <5*  CREATININE 0.85 0.89  CALCIUM 9.3 8.9   GFR: Estimated Creatinine Clearance: 139.3 mL/min (by C-G formula based on SCr of 0.89 mg/dL). Liver Function Tests:  Recent Labs Lab 10/23/16 0551  AST 18  ALT 15*  ALKPHOS 75  BILITOT 0.4  PROT 6.2*  ALBUMIN 3.3*   No results for input(s): LIPASE, AMYLASE in the last 168 hours. No results for input(s): AMMONIA in the last 168 hours. Coagulation Profile: No results for input(s): INR, PROTIME in the last 168 hours. Cardiac Enzymes: No results for input(s): CKTOTAL, CKMB, CKMBINDEX, TROPONINI in the last 168 hours. BNP (last 3 results) No results for input(s): PROBNP in the last 8760 hours. HbA1C: No results for input(s): HGBA1C in the last 72 hours. CBG:  Recent Labs Lab 10/22/16 1216 10/22/16 1848 10/23/16 0008  GLUCAP 81 89 87   Lipid Profile: No results for input(s): CHOL, HDL, LDLCALC, TRIG, CHOLHDL, LDLDIRECT in the last 72 hours. Thyroid Function Tests: No results for input(s): TSH, T4TOTAL, FREET4, T3FREE, THYROIDAB in the last 72 hours. Anemia Panel: No results for input(s): VITAMINB12, FOLATE, FERRITIN, TIBC, IRON, RETICCTPCT in the last 72 hours.  Sepsis Labs:  Recent Labs Lab 10/21/16 2049 10/21/16 2226 10/22/16 0542 10/23/16 0551  WBC 20.5*  --  18.5* 12.7*  LATICACIDVEN  --  0.56 0.7  --     Recent Results (from the past 240 hour(s))  C difficile quick scan w PCR reflex     Status: Abnormal   Collection Time: 10/22/16 11:04 AM    Result Value Ref Range Status   C Diff antigen POSITIVE (A) NEGATIVE Final   C Diff toxin POSITIVE (A) NEGATIVE Final   C Diff interpretation Toxin producing C. difficile detected.  Final    Comment: CRITICAL RESULT CALLED TO, READ BACK BY AND VERIFIED WITH: Cherylann RatelG YORRICK,RN AT 1222 10/22/16 BY L BENFIELD   Gastrointestinal Panel by PCR , Stool     Status: None   Collection Time: 10/22/16 11:04 AM  Result Value Ref Range Status   Campylobacter species NOT DETECTED NOT DETECTED Final   Plesimonas shigelloides NOT DETECTED NOT DETECTED Final   Salmonella species NOT DETECTED NOT DETECTED Final   Yersinia enterocolitica NOT DETECTED NOT DETECTED Final   Vibrio species NOT DETECTED NOT DETECTED Final   Vibrio cholerae NOT DETECTED NOT DETECTED Final   Enteroaggregative E coli (EAEC) NOT DETECTED NOT DETECTED Final   Enteropathogenic E coli (EPEC) NOT DETECTED NOT DETECTED Final   Enterotoxigenic E coli (ETEC) NOT DETECTED NOT DETECTED Final   Shiga like toxin producing  E coli (STEC) NOT DETECTED NOT DETECTED Final   Shigella/Enteroinvasive E coli (EIEC) NOT DETECTED NOT DETECTED Final   Cryptosporidium NOT DETECTED NOT DETECTED Final   Cyclospora cayetanensis NOT DETECTED NOT DETECTED Final   Entamoeba histolytica NOT DETECTED NOT DETECTED Final   Giardia lamblia NOT DETECTED NOT DETECTED Final   Adenovirus F40/41 NOT DETECTED NOT DETECTED Final   Astrovirus NOT DETECTED NOT DETECTED Final   Norovirus GI/GII NOT DETECTED NOT DETECTED Final   Rotavirus A NOT DETECTED NOT DETECTED Final   Sapovirus (I, II, IV, and V) NOT DETECTED NOT DETECTED Final         Radiology Studies: Dg Chest 2 View  Result Date: 10/21/2016 CLINICAL DATA:  Shortness of breath, chest pain, fever, chills for 1 week. EXAM: CHEST  2 VIEW COMPARISON:  Chest radiograph October 14, 2015 FINDINGS: Cardiomediastinal silhouette is normal. No pleural effusions or focal consolidations. Trachea projects midline and there is  no pneumothorax. Soft tissue planes and included osseous structures are non-suspicious. IMPRESSION: Normal chest. Electronically Signed   By: Awilda Metro M.D.   On: 10/21/2016 22:22   Ct Abdomen Pelvis W Contrast  Result Date: 10/22/2016 CLINICAL DATA:  Right lower quadrant pain EXAM: CT ABDOMEN AND PELVIS WITH CONTRAST TECHNIQUE: Multidetector CT imaging of the abdomen and pelvis was performed using the standard protocol following bolus administration of intravenous contrast. CONTRAST:  ISOVUE-300 IOPAMIDOL (ISOVUE-300) INJECTION 61% COMPARISON:  07/19/2016, 11/19/2015 FINDINGS: Lower chest: Lung bases demonstrate no acute consolidation or effusion. Normal heart size Hepatobiliary: No focal liver abnormality is seen. No gallstones, gallbladder wall thickening, or biliary dilatation. Pancreas: Unremarkable. No pancreatic ductal dilatation or surrounding inflammatory changes. Spleen: Normal in size without focal abnormality. Adrenals/Urinary Tract: Adrenal glands are unremarkable. Kidneys are normal, without renal calculi, focal lesion, or hydronephrosis. Bladder is unremarkable. Stomach/Bowel: Stomach nonenlarged.  No dilated small bowel. There is thickening of the terminal ileum and the ileocecal valve. There is wall thickening of the cecum and ascending colon with additional wall thickening of the transverse colon, descending colon. The sigmoid colon and rectum are under distended. The appendix also appears thickened but is non dilated. No soft tissue stranding is present around the appendix. Vascular/Lymphatic: Multiple prominent right lower quadrant mesenteric lymph nodes. Non aneurysmal aorta Reproductive: Prostate is unremarkable. Other: No free air or free fluid Musculoskeletal: No acute or significant osseous findings. IMPRESSION: 1. Thickening of the terminal ileum, cecum, ascending colon, transverse colon and descending colon consistent with a mild colitis. This may be of infectious or  inflammatory etiology. There is mild wall thickening of the appendix however appendix is nonenlarged and there is no surrounding inflammatory change to suggest an acute appendicitis. 2. Right lower quadrant mesenteric lymph nodes Electronically Signed   By: Jasmine Pang M.D.   On: 10/22/2016 01:26        Scheduled Meds: . saccharomyces boulardii  250 mg Oral BID  . vancomycin  125 mg Oral QID   Continuous Infusions:    LOS: 1 day        [facility], MD [facility] Pager [phone]  If 7PM-7AM, please contact night-coverage [email or web address] Password [email or web address] 10/23/2016, 4:05 PM

## 2016-10-23 NOTE — Progress Notes (Signed)
Unassigned patient Subjective: Patient seems to be feeling somewhat better today. On detailed questioning, he is not sure about his diagnosis of UC by his gastroenterologist in [facility]. He denies having any nausea, vomiting, melena or hematochezia. He still has a lot of RLQ pain. No BM's today. Several loose BM's yesterday.   Objective: Vital signs in last 24 hours: Temp:  [98.1 F (36.7 C)-98.5 F (36.9 C)] 98.1 F (36.7 C) (01/17 0538) Pulse Rate:  [54-59] 59 (01/17 0538) Resp:  [17-18] 17 (01/17 0538) BP: (96-113)/(40-63) 96/40 (01/17 0538) SpO2:  [96 %-99 %] 99 % (01/17 0538) Last BM Date: 10/22/16  Intake/Output from previous day: 01/16 0701 - 01/17 0700 In: 2222.9 [I.V.:2222.9] Out: 450 [Urine:450] Intake/Output this shift: Total I/O In: -  Out: 300 [Urine:300]  General appearance: alert, cooperative, fatigued, no distress and pale Resp: clear to auscultation bilaterally Cardio: regular rate and rhythm, S1, S2 normal, no murmur, click, rub or gallop GI: soft with RLQ tenderness on palpation with gaurding but without rebound or rigidity; patient has hypoactive wN; bowel sounds normal; no masses,  no organomegaly Extremities: extremities normal, atraumatic, no cyanosis or edema  Lab Results:  Recent Labs  10/21/16 2049 10/22/16 0542 10/23/16 0551  WBC 20.5* 18.5* 12.7*  HGB 14.6 13.2 13.6  HCT 41.6 38.7* 40.3  PLT 307 257 280   BMET  Recent Labs  10/21/16 2049 10/23/16 0551  NA 135 138  K 3.3* 4.0  CL 105 108  CO2 21* 23  GLUCOSE 104* 89  BUN 6 <5*  CREATININE 0.85 0.89  CALCIUM 9.3 8.9   LFT  Recent Labs  10/23/16 0551  PROT 6.2*  ALBUMIN 3.3*  AST 18  ALT 15*  ALKPHOS 75  BILITOT 0.4   C-Diff  Recent Labs  10/22/16 1104  CDIFFTOX POSITIVE*   Studies/Results: Dg Chest 2 View  Result Date: 10/21/2016 CLINICAL DATA:  Shortness of breath, chest pain, fever, chills for 1 week. EXAM: CHEST  2 VIEW COMPARISON:  Chest radiograph October 14, 2015 FINDINGS: Cardiomediastinal silhouette is normal. No pleural effusions or focal consolidations. Trachea projects midline and there is no pneumothorax. Soft tissue planes and included osseous structures are non-suspicious. IMPRESSION: Normal chest. Electronically Signed   By: Awilda Metro M.D.   On: 10/21/2016 22:22   Ct Abdomen Pelvis W Contrast  Result Date: 10/22/2016 CLINICAL DATA:  Right lower quadrant pain EXAM: CT ABDOMEN AND PELVIS WITH CONTRAST TECHNIQUE: Multidetector CT imaging of the abdomen and pelvis was performed using the standard protocol following bolus administration of intravenous contrast. CONTRAST:  ISOVUE-300 IOPAMIDOL (ISOVUE-300) INJECTION 61% COMPARISON:  07/19/2016, 11/19/2015 FINDINGS: Lower chest: Lung bases demonstrate no acute consolidation or effusion. Normal heart size Hepatobiliary: No focal liver abnormality is seen. No gallstones, gallbladder wall thickening, or biliary dilatation. Pancreas: Unremarkable. No pancreatic ductal dilatation or surrounding inflammatory changes. Spleen: Normal in size without focal abnormality. Adrenals/Urinary Tract: Adrenal glands are unremarkable. Kidneys are normal, without renal calculi, focal lesion, or hydronephrosis. Bladder is unremarkable. Stomach/Bowel: Stomach nonenlarged.  No dilated small bowel. There is thickening of the terminal ileum and the ileocecal valve. There is wall thickening of the cecum and ascending colon with additional wall thickening of the transverse colon, descending colon. The sigmoid colon and rectum are under distended. The appendix also appears thickened but is non dilated. No soft tissue stranding is present around the appendix. Vascular/Lymphatic: Multiple prominent right lower quadrant mesenteric lymph nodes. Non aneurysmal aorta Reproductive: Prostate is unremarkable. Other:  No free air or free fluid Musculoskeletal: No acute or significant osseous findings. IMPRESSION: 1. Thickening of the  terminal ileum, cecum, ascending colon, transverse colon and descending colon consistent with a mild colitis. This may be of infectious or inflammatory etiology. There is mild wall thickening of the appendix however appendix is nonenlarged and there is no surrounding inflammatory change to suggest an acute appendicitis. 2. Right lower quadrant mesenteric lymph nodes Electronically Signed   By: Jasmine PangKim  Fujinaga M.D.   On: 10/22/2016 01:26   Medications: I have reviewed the patient's current medications.  Assessment/Plan: C. Difficile colitis-continue present care. Patient may need a repeat colonoscopy once he recovers from the C. Difficile infection. We will also work on getting his old records so that we can review them.     LOS: 1 day   Kiandria Clum 10/23/2016, 11:26 AM

## 2016-10-24 LAB — CBC
HEMATOCRIT: 41.5 % (ref 39.0–52.0)
HEMOGLOBIN: 14.2 g/dL (ref 13.0–17.0)
MCH: 29.6 pg (ref 26.0–34.0)
MCHC: 34.2 g/dL (ref 30.0–36.0)
MCV: 86.6 fL (ref 78.0–100.0)
Platelets: 316 10*3/uL (ref 150–400)
RBC: 4.79 MIL/uL (ref 4.22–5.81)
RDW: 13.1 % (ref 11.5–15.5)
WBC: 10.8 10*3/uL — AB (ref 4.0–10.5)

## 2016-10-24 LAB — BASIC METABOLIC PANEL
ANION GAP: 5 (ref 5–15)
BUN: <5 mg/dL — ABNORMAL LOW (ref 6–20)
CALCIUM: 9.3 mg/dL (ref 8.9–10.3)
CHLORIDE: 106 mmol/L (ref 101–111)
CO2: 28 mmol/L (ref 22–32)
Creatinine, Ser: 0.9 mg/dL (ref 0.61–1.24)
GFR calc Af Amer: >60 mL/min (ref >60)
GFR calc non Af Amer: >60 mL/min (ref >60)
GLUCOSE: 95 mg/dL (ref 65–99)
POTASSIUM: 4.5 mmol/L (ref 3.5–5.1)
Sodium: 139 mmol/L (ref 135–145)

## 2016-10-24 MED ORDER — OXYCODONE-ACETAMINOPHEN 5-325 MG PO TABS
2.0000 | ORAL_TABLET | ORAL | Status: DC | PRN
  Administered 2016-10-24 – 2016-10-25 (×6): 2 via ORAL
  Filled 2016-10-24 (×6): qty 2

## 2016-10-24 MED ORDER — BOOST / RESOURCE BREEZE PO LIQD
1.0000 | Freq: Three times a day (TID) | ORAL | Status: DC
  Administered 2016-10-24 – 2016-10-25 (×4): 1 via ORAL

## 2016-10-24 MED ORDER — HYDROMORPHONE HCL 2 MG/ML IJ SOLN
1.0000 mg | INTRAMUSCULAR | Status: DC | PRN
  Administered 2016-10-24 – 2016-10-25 (×4): 1 mg via INTRAVENOUS
  Filled 2016-10-24 (×4): qty 1

## 2016-10-24 NOTE — Progress Notes (Signed)
Brief Nutrition Follow-Up Note  Chart reviewed. Pt was last assessed by RD on 10/22/16, when he was NPO (see initial assessment for further details). Pt has since ben advanced to a clear liquid diet. Noted poor meal completion (PO: 0-25%). RD will add Boost Breeze po TID, each supplement provides 250 kcal and 9 grams of protein.   RD will continue to follow.   Erva Koke A. Mayford KnifeWilliams, RD, LDN, CDE Pager: [phone] After hours Pager: [phone]

## 2016-10-24 NOTE — Progress Notes (Signed)
Subjective: Diarrhea stopped yesterday and pain has improved, but still with significant abdominal pain.  Objective: Vital signs in last 24 hours: Temp:  [98.3 F (36.8 C)-98.7 F (37.1 C)] 98.7 F (37.1 C) (01/18 0436) Pulse Rate:  [51-81] 60 (01/18 0607) Resp:  [18] 18 (01/18 0436) BP: (102-115)/(51-64) 102/51 (01/18 0436) SpO2:  [98 %-99 %] 99 % (01/18 0436) Last BM Date: 10/22/16  Intake/Output from previous day: 01/17 0701 - 01/18 0700 In: 640 [I.V.:640] Out: 1470 [Urine:1470] Intake/Output this shift: Total I/O In: 240 [P.O.:240] Out: 600 [Urine:600]  General appearance: alert and no distress GI: diffusely tender, but improved.  Lab Results:  Recent Labs  10/22/16 0542 10/23/16 0551 10/24/16 0543  WBC 18.5* 12.7* 10.8*  HGB 13.2 13.6 14.2  HCT 38.7* 40.3 41.5  PLT 257 280 316   BMET  Recent Labs  10/21/16 2049 10/23/16 0551 10/24/16 0543  NA 135 138 139  K 3.3* 4.0 4.5  CL 105 108 106  CO2 21* 23 28  GLUCOSE 104* 89 95  BUN 6 <5* <5*  CREATININE 0.85 0.89 0.90  CALCIUM 9.3 8.9 9.3   LFT  Recent Labs  10/23/16 0551  PROT 6.2*  ALBUMIN 3.3*  AST 18  ALT 15*  ALKPHOS 75  BILITOT 0.4   PT/INR No results for input(s): LABPROT, INR in the last 72 hours. Hepatitis Panel No results for input(s): HEPBSAG, HCVAB, HEPAIGM, HEPBIGM in the last 72 hours. C-Diff  Recent Labs  10/22/16 1104  CDIFFTOX POSITIVE*   Fecal Lactopherrin No results for input(s): FECLLACTOFRN in the last 72 hours.  Studies/Results: No results found.  Medications:  Scheduled: . saccharomyces boulardii  250 mg Oral BID  . vancomycin  125 mg Oral QID   Continuous:   Assessment/Plan: 1) C. Diff. 2) ? UC.   Clinically he is improving.  Doing much better, but he still has abdominal pain.    Plan: 1) Continue with clear liquids.  He did not want to advance any further at this time. 2) Continue with vancomycin.  LOS: 2 days   Trianna Lupien D 10/24/2016,  10:10 AM

## 2016-10-24 NOTE — Progress Notes (Signed)
PROGRESS NOTE                                                                                                                                                                                                             Patient Demographics:    Nathan Daugherty, is a 26 y.o. male, DOB - 1991-06-08, ZOX:[id number]  Admit date - 10/21/2016   Admitting Physician Alberteen Sam, MD  Outpatient Primary MD for the patient is Dema Severin, NP  LOS - 2  Outpatient Specialists:  GI Dr Lasandra Beech [place]  Chief Complaint  Patient presents with  . Fever       Brief Narrative  26 year old male wit history of? Ulcerative colitis (Seen on 11/22/2015 by Dr Octaviano Glow and had colonoscopy around that time), not on maintenance medications, reportedly gets flared up about 3 times a year mostly precipitated by antibiotics was seen in the hospital ED one week prior and was discharged on Augmentin for sinus infection/bronchitis and? Bloodstream infection. He had transient diarrhea about 1 week prior to admission which resolved but again had severe nonbloody diarrhea with abdominal pain, nausea without vomiting and fever of 102F at home since 2 day prior to presenting to the ED. CT abdomen showed pancolitis. Was empirically started on IV Cipro and Flagyl but C. difficile was positive so antibiotic switched to oral vancomycin. GI consulted.   Subjective:   Patient reports his abdominal pain minimally better but still has some nausea.   Assessment  & Plan :    Principal Problem: Pancolitis secondary to C. difficile  Possibly contributed by recent antibiotic use. Started on oral vancomycin. IV fluids and antiemetics for supportive care. Diarrhea resolved since yesterday. Add Percocet as needed for pain along with low-dose IV Dilaudid for severe symptoms.  serial abdominal exam. GI following.Continue clear liquid for now.  ? Ulcerative  colitis Will obtain records from gastroenterologist in [facility].   Active Problems:   Hypokalemia Replenished      Code Status : Full code  Family Communication none at bedside  Disposition Plan  : Home once improved  Barriers For Discharge : Active symptoms  Consults  :   Dr. Nicholes Mango  Procedures  : CT abdomen and pelvis  DVT Prophylaxis  :  Lovenox -   Lab Results  Component Value Date   PLT  316 10/24/2016    Antibiotics  :   Anti-infectives    Start     Dose/Rate Route Frequency Ordered Stop   10/22/16 1400  vancomycin (VANCOCIN) 50 mg/mL oral solution 125 mg     125 mg Oral 4 times daily 10/22/16 1229 11/05/16 1359   10/22/16 0800  metroNIDAZOLE (FLAGYL) IVPB 500 mg  Status:  Discontinued     500 mg 100 mL/hr over 60 Minutes Intravenous Every 8 hours 10/22/16 0637 10/22/16 1229   10/22/16 0800  ciprofloxacin (CIPRO) IVPB 400 mg  Status:  Discontinued     400 mg 200 mL/hr over 60 Minutes Intravenous Every 12 hours 10/22/16 0644 10/22/16 1141   10/22/16 0145  metroNIDAZOLE (FLAGYL) tablet 500 mg     500 mg Oral  Once 10/22/16 0135 10/22/16 0253   10/22/16 0145  ciprofloxacin (CIPRO) tablet 500 mg     500 mg Oral  Once 10/22/16 0135 10/22/16 0253        Objective:   Vitals:   10/23/16 1327 10/23/16 2247 10/24/16 0436 10/24/16 0607  BP: 107/64 115/60 (!) 102/51   Pulse: 81 62 (!) 51 60  Resp: 18 18 18    Temp: 98.3 F (36.8 C) 98.7 F (37.1 C) 98.7 F (37.1 C)   TempSrc: Oral Oral Oral   SpO2: 98% 98% 99%   Weight:      Height:        Wt Readings from Last 3 Encounters:  10/21/16 79.4 kg (175 lb)     Intake/Output Summary (Last 24 hours) at 10/24/16 1426 Last data filed at 10/24/16 1303  Gross per 24 hour  Intake              880 ml  Output             2270 ml  Net            -1390 ml     Physical Exam  Gen: not in distress HEENT: no pallor, Dry mucosa, supple neck, no thrush Chest: clear b/l, no added sounds CVS: N S1&S2, no  murmurs,  GI: soft, mild infraumbilical distention, tender, bowel sounds present Musculoskeletal: warm, no edema     Data Review:    CBC  Recent Labs Lab 10/21/16 2049 10/22/16 0542 10/23/16 0551 10/24/16 0543  WBC 20.5* 18.5* 12.7* 10.8*  HGB 14.6 13.2 13.6 14.2  HCT 41.6 38.7* 40.3 41.5  PLT 307 257 280 316  MCV 83.5 85.2 86.1 86.6  MCH 29.3 29.1 29.1 29.6  MCHC 35.1 34.1 33.7 34.2  RDW 12.4 12.8 13.1 13.1  LYMPHSABS 3.4 5.0*  --   --   MONOABS 2.5* 2.2*  --   --   EOSABS 0.1 0.2  --   --   BASOSABS 0.0 0.0  --   --     Chemistries   Recent Labs Lab 10/21/16 2049 10/23/16 0551 10/24/16 0543  NA 135 138 139  K 3.3* 4.0 4.5  CL 105 108 106  CO2 21* 23 28  GLUCOSE 104* 89 95  BUN 6 <5* <5*  CREATININE 0.85 0.89 0.90  CALCIUM 9.3 8.9 9.3  AST  --  18  --   ALT  --  15*  --   ALKPHOS  --  75  --   BILITOT  --  0.4  --    ------------------------------------------------------------------------------------------------------------------ No results for input(s): CHOL, HDL, LDLCALC, TRIG, CHOLHDL, LDLDIRECT in the last 72 hours.  No results found for: HGBA1C ------------------------------------------------------------------------------------------------------------------  No results for input(s): TSH, T4TOTAL, T3FREE, THYROIDAB in the last 72 hours.  Invalid input(s): FREET3 ------------------------------------------------------------------------------------------------------------------ No results for input(s): VITAMINB12, FOLATE, FERRITIN, TIBC, IRON, RETICCTPCT in the last 72 hours.  Coagulation profile No results for input(s): INR, PROTIME in the last 168 hours.  No results for input(s): DDIMER in the last 72 hours.  Cardiac Enzymes No results for input(s): CKMB, TROPONINI, MYOGLOBIN in the last 168 hours.  Invalid input(s): CK ------------------------------------------------------------------------------------------------------------------ No  results found for: BNP  Inpatient Medications  Scheduled Meds: . feeding supplement  1 Container Oral TID BM  . saccharomyces boulardii  250 mg Oral BID  . vancomycin  125 mg Oral QID   Continuous Infusions: PRN Meds:.acetaminophen **OR** acetaminophen, HYDROmorphone (DILAUDID) injection, ondansetron **OR** ondansetron (ZOFRAN) IV, oxyCODONE-acetaminophen, zolpidem  Micro Results Recent Results (from the past 240 hour(s))  C difficile quick scan w PCR reflex     Status: Abnormal   Collection Time: 10/22/16 11:04 AM  Result Value Ref Range Status   C Diff antigen POSITIVE (A) NEGATIVE Final   C Diff toxin POSITIVE (A) NEGATIVE Final   C Diff interpretation Toxin producing C. difficile detected.  Final    Comment: CRITICAL RESULT CALLED TO, READ BACK BY AND VERIFIED WITH: Cherylann Ratel AT 1222 10/22/16 BY L BENFIELD   Gastrointestinal Panel by PCR , Stool     Status: None   Collection Time: 10/22/16 11:04 AM  Result Value Ref Range Status   Campylobacter species NOT DETECTED NOT DETECTED Final   Plesimonas shigelloides NOT DETECTED NOT DETECTED Final   Salmonella species NOT DETECTED NOT DETECTED Final   Yersinia enterocolitica NOT DETECTED NOT DETECTED Final   Vibrio species NOT DETECTED NOT DETECTED Final   Vibrio cholerae NOT DETECTED NOT DETECTED Final   Enteroaggregative E coli (EAEC) NOT DETECTED NOT DETECTED Final   Enteropathogenic E coli (EPEC) NOT DETECTED NOT DETECTED Final   Enterotoxigenic E coli (ETEC) NOT DETECTED NOT DETECTED Final   Shiga like toxin producing E coli (STEC) NOT DETECTED NOT DETECTED Final   Shigella/Enteroinvasive E coli (EIEC) NOT DETECTED NOT DETECTED Final   Cryptosporidium NOT DETECTED NOT DETECTED Final   Cyclospora cayetanensis NOT DETECTED NOT DETECTED Final   Entamoeba histolytica NOT DETECTED NOT DETECTED Final   Giardia lamblia NOT DETECTED NOT DETECTED Final   Adenovirus F40/41 NOT DETECTED NOT DETECTED Final   Astrovirus NOT DETECTED  NOT DETECTED Final   Norovirus GI/GII NOT DETECTED NOT DETECTED Final   Rotavirus A NOT DETECTED NOT DETECTED Final   Sapovirus (I, II, IV, and V) NOT DETECTED NOT DETECTED Final    Radiology Reports Dg Chest 2 View  Result Date: 10/21/2016 CLINICAL DATA:  Shortness of breath, chest pain, fever, chills for 1 week. EXAM: CHEST  2 VIEW COMPARISON:  Chest radiograph October 14, 2015 FINDINGS: Cardiomediastinal silhouette is normal. No pleural effusions or focal consolidations. Trachea projects midline and there is no pneumothorax. Soft tissue planes and included osseous structures are non-suspicious. IMPRESSION: Normal chest. Electronically Signed   By: Awilda Metro M.D.   On: 10/21/2016 22:22   Ct Abdomen Pelvis W Contrast  Result Date: 10/22/2016 CLINICAL DATA:  Right lower quadrant pain EXAM: CT ABDOMEN AND PELVIS WITH CONTRAST TECHNIQUE: Multidetector CT imaging of the abdomen and pelvis was performed using the standard protocol following bolus administration of intravenous contrast. CONTRAST:  ISOVUE-300 IOPAMIDOL (ISOVUE-300) INJECTION 61% COMPARISON:  07/19/2016, 11/19/2015 FINDINGS: Lower chest: Lung bases demonstrate no acute consolidation or effusion. Normal heart  size Hepatobiliary: No focal liver abnormality is seen. No gallstones, gallbladder wall thickening, or biliary dilatation. Pancreas: Unremarkable. No pancreatic ductal dilatation or surrounding inflammatory changes. Spleen: Normal in size without focal abnormality. Adrenals/Urinary Tract: Adrenal glands are unremarkable. Kidneys are normal, without renal calculi, focal lesion, or hydronephrosis. Bladder is unremarkable. Stomach/Bowel: Stomach nonenlarged.  No dilated small bowel. There is thickening of the terminal ileum and the ileocecal valve. There is wall thickening of the cecum and ascending colon with additional wall thickening of the transverse colon, descending colon. The sigmoid colon and rectum are under distended.  The appendix also appears thickened but is non dilated. No soft tissue stranding is present around the appendix. Vascular/Lymphatic: Multiple prominent right lower quadrant mesenteric lymph nodes. Non aneurysmal aorta Reproductive: Prostate is unremarkable. Other: No free air or free fluid Musculoskeletal: No acute or significant osseous findings. IMPRESSION: 1. Thickening of the terminal ileum, cecum, ascending colon, transverse colon and descending colon consistent with a mild colitis. This may be of infectious or inflammatory etiology. There is mild wall thickening of the appendix however appendix is nonenlarged and there is no surrounding inflammatory change to suggest an acute appendicitis. 2. Right lower quadrant mesenteric lymph nodes Electronically Signed   By: Jasmine Pang M.D.   On: 10/22/2016 01:26    Time Spent in minutes  25   Eddie North M.D on 10/24/2016 at 2:26 PM  Between 7am to 7pm - Pager - [phone]  After 7pm go to [email or web address] - password [facility]  [facility] -  Office  [phone]

## 2016-10-25 DIAGNOSIS — A0472 Enterocolitis due to Clostridium difficile, not specified as recurrent: Secondary | ICD-10-CM

## 2016-10-25 HISTORY — DX: Enterocolitis due to Clostridium difficile, not specified as recurrent: A04.72

## 2016-10-25 MED ORDER — BOOST / RESOURCE BREEZE PO LIQD: 1.0000 | Freq: Three times a day (TID) | ORAL | 0 refills | Status: DC

## 2016-10-25 MED ORDER — OXYCODONE-ACETAMINOPHEN 5-325 MG PO TABS: 2.0000 | ORAL_TABLET | Freq: Four times a day (QID) | ORAL | 0 refills | Status: DC | PRN

## 2016-10-25 MED ORDER — VANCOMYCIN 50 MG/ML ORAL SOLUTION: 125.0000 mg | Freq: Four times a day (QID) | ORAL | 0 refills | Status: AC

## 2016-10-25 MED ORDER — SACCHAROMYCES BOULARDII 250 MG PO CAPS: 250.0000 mg | ORAL_CAPSULE | Freq: Two times a day (BID) | ORAL | 0 refills | Status: DC

## 2016-10-25 MED ORDER — ONDANSETRON HCL 4 MG PO TABS: 4.0000 mg | ORAL_TABLET | Freq: Three times a day (TID) | ORAL | 0 refills | Status: DC | PRN

## 2016-10-25 NOTE — Progress Notes (Signed)
Nathan Daugherty discharged Home per MD order.  Discharge instructions reviewed and discussed with the patient, all questions and concerns answered. Copy of instructions, care notes and scripts given to patient.  Allergies as of 10/25/2016   No Known Allergies     Medication List    STOP taking these medications   amoxicillin-clavulanate 875-125 MG tablet Commonly known as:  AUGMENTIN   HYDROcodone-acetaminophen 5-325 MG tablet Commonly known as:  NORCO/VICODIN     TAKE these medications   acetaminophen 325 MG tablet Commonly known as:  TYLENOL Take 650 mg by mouth every 4 (four) hours as needed for moderate pain or headache.   chlorpheniramine-HYDROcodone 10-8 MG/5ML Suer Commonly known as:  TUSSIONEX Take 5 mLs by mouth at bedtime as needed for cough.   feeding supplement Liqd Take 1 Container by mouth 3 (three) times daily between meals.   ibuprofen 200 MG tablet Commonly known as:  ADVIL,MOTRIN Take 400 mg by mouth every 6 (six) hours as needed for headache or moderate pain.   ondansetron 4 MG tablet Commonly known as:  ZOFRAN Take 1 tablet (4 mg total) by mouth every 8 (eight) hours as needed for nausea or vomiting. What changed:  when to take this  reasons to take this   oxyCODONE-acetaminophen 5-325 MG tablet Commonly known as:  PERCOCET Take 2 tablets by mouth every 6 (six) hours as needed.   saccharomyces boulardii 250 MG capsule Commonly known as:  FLORASTOR Take 1 capsule (250 mg total) by mouth 2 (two) times daily.   vancomycin 50 mg/mL oral solution Commonly known as:  VANCOCIN Take 2.5 mLs (125 mg total) by mouth 4 (four) times daily.       Patients skin is clean, dry and intact, no evidence of skin break down. IV site discontinued and catheter remains intact. Site without signs and symptoms of complications. Dressing and pressure applied.  Patient escorted to car by NT in a wheelchair,  no distress noted upon discharge.  Laural BenesJohnson, Tonyetta Berko  C 10/25/2016 4:06 PM

## 2016-10-25 NOTE — Progress Notes (Signed)
Subjective: Feeling better.  ABM pain improving, but still very tender.  Two bouts of diarrhea yesterday.  Objective: Vital signs in last 24 hours: Temp:  [98.1 F (36.7 C)-98.2 F (36.8 C)] 98.1 F (36.7 C) (01/19 0554) Pulse Rate:  [52-67] 53 (01/19 0554) Resp:  [16-18] 18 (01/19 0554) BP: (102-110)/(47-57) 104/51 (01/19 0554) SpO2:  [95 %-99 %] 95 % (01/19 0554) Last BM Date: 10/22/16  Intake/Output from previous day: 01/18 0701 - 01/19 0700 In: 840 [P.O.:840] Out: 1450 [Urine:1450] Intake/Output this shift: No intake/output data recorded.  General appearance: alert and no distress GI: diffusely tender, but improved.  Lab Results:  Recent Labs  10/23/16 0551 10/24/16 0543  WBC 12.7* 10.8*  HGB 13.6 14.2  HCT 40.3 41.5  PLT 280 316   BMET  Recent Labs  10/23/16 0551 10/24/16 0543  NA 138 139  K 4.0 4.5  CL 108 106  CO2 23 28  GLUCOSE 89 95  BUN <5* <5*  CREATININE 0.89 0.90  CALCIUM 8.9 9.3   LFT  Recent Labs  10/23/16 0551  PROT 6.2*  ALBUMIN 3.3*  AST 18  ALT 15*  ALKPHOS 75  BILITOT 0.4   PT/INR No results for input(s): LABPROT, INR in the last 72 hours. Hepatitis Panel No results for input(s): HEPBSAG, HCVAB, HEPAIGM, HEPBIGM in the last 72 hours. C-Diff  Recent Labs  10/22/16 1104  CDIFFTOX POSITIVE*   Fecal Lactopherrin No results for input(s): FECLLACTOFRN in the last 72 hours.  Studies/Results: No results found.  Medications:  Scheduled: . feeding supplement  1 Container Oral TID BM  . saccharomyces boulardii  250 mg Oral BID  . vancomycin  125 mg Oral QID   Continuous:   Assessment/Plan: 1) C. Diff colitis. 2) UC.   The patient feels better.  He can be advanced to a regular diet and I think he can go home today.    Plan: 1) Regular diet. 2) Follow up in the office in two week.s 3) Treat C. Diff with vancomycin 125 mg QID x 14 days total.  LOS: 3 days   Staton Markey D 10/25/2016, 10:04 AM

## 2016-10-25 NOTE — Discharge Summary (Signed)
Physician Discharge Summary  Herby Amick WUJ:[id number] DOB: 05/15/1991 DOA: 10/21/2016  PCP: Dema Severin, NP  Admit date: 10/21/2016 Discharge date: 10/25/2016  Admitted From: *Home Disposition:  Home  Recommendations for Outpatient Follow-up:  1. Follow up with PCP in 1-2 weeks 2. Patient will complete 2 week course of oral vancomycin on 1/31. 3. Patient to follow-up with GI Dr. Elnoria Howard in 2 weeks  Home Health: None Equipment/Devices: None  Discharge Condition: Stable CODE STATUS: Full code Diet recommendation: Regular    Discharge Diagnoses:  Principal Problem:   Colitis, Clostridium difficile   Active Problems:   Hypokalemia  Brief narrative/history of present illness 26 year old male wit history of? Ulcerative colitis (Seen on 11/22/2015 by Dr Octaviano Glow and had colonoscopy around that time), not on maintenance medications, reportedly gets flared up about 3 times a year mostly precipitated by antibiotics was seen in the hospital ED one week prior and was discharged on Augmentin for sinus infection/bronchitis and? Bloodstream infection. He had transient diarrhea about 1 week prior to admission which resolved but again had severe nonbloody diarrhea with abdominal pain, nausea without vomiting and fever of 102F at home since 2 day prior to presenting to the ED. CT abdomen showed pancolitis. Was empirically started on IV Cipro and Flagyl but C. difficile was positive so antibiotic switched to oral vancomycin. GI consulted.  Principal Problem: Pancolitis secondary to C. difficile  Possibly contributed by recent antibiotic use. Started on oral vancomycin. IV fluids and antiemetics for supportive care.  diarrhea improved. Abdominal pain getting better with Percocet and as needed low-dose Dilaudid. Advance diet to regular and tolerating well.  Patient will be discharged on total 14 day course of oral vancomycin. Added florastor. Added when necessary Percocet for pain and Zofran for  nausea. Instructed both patient and his wife on regular handwashing.   ? Ulcerative colitis Patient reports being diagnosed of ulcerative colitis and had colonoscopy done about a year ago in [facility] with Dr Dorna Leitz. I had called the office to obtain records but have not received any. Patient wishes to follow-up with Dr. Elnoria Howard as outpatient.   Hypokalemia Replenished   Consults GI (Dr. Elnoria Howard)  Family communication: Discussed with wife on the phone  Procedure: CT Abdomen    Discharge Instructions   Allergies as of 10/25/2016   No Known Allergies     Medication List    STOP taking these medications   amoxicillin-clavulanate 875-125 MG tablet Commonly known as:  AUGMENTIN   HYDROcodone-acetaminophen 5-325 MG tablet Commonly known as:  NORCO/VICODIN     TAKE these medications   acetaminophen 325 MG tablet Commonly known as:  TYLENOL Take 650 mg by mouth every 4 (four) hours as needed for moderate pain or headache.   chlorpheniramine-HYDROcodone 10-8 MG/5ML Suer Commonly known as:  TUSSIONEX Take 5 mLs by mouth at bedtime as needed for cough.   feeding supplement Liqd Take 1 Container by mouth 3 (three) times daily between meals.   ibuprofen 200 MG tablet Commonly known as:  ADVIL,MOTRIN Take 400 mg by mouth every 6 (six) hours as needed for headache or moderate pain.   ondansetron 4 MG tablet Commonly known as:  ZOFRAN Take 1 tablet (4 mg total) by mouth every 8 (eight) hours as needed for nausea or vomiting. What changed:  when to take this  reasons to take this   oxyCODONE-acetaminophen 5-325 MG tablet Commonly known as:  PERCOCET Take 2 tablets by mouth every 6 (six) hours as needed.   saccharomyces  boulardii 250 MG capsule Commonly known as:  FLORASTOR Take 1 capsule (250 mg total) by mouth 2 (two) times daily.   vancomycin 50 mg/mL oral solution Commonly known as:  VANCOCIN Take 2.5 mLs (125 mg total) by mouth 4 (four) times daily.       Follow-up Information    Dema Severin, NP. Schedule an appointment as soon as possible for a visit in 1 week(s).   Contact information: [address] [place] [address] [phone]        Theda Belfast, MD. Schedule an appointment as soon as possible for a visit in 2 week(s).   Specialty:  Gastroenterology Contact information: [address] [place] [address] [phone]          No Known Allergies    Procedures/Studies: Dg Chest 2 View  Result Date: 10/21/2016 CLINICAL DATA:  Shortness of breath, chest pain, fever, chills for 1 week. EXAM: CHEST  2 VIEW COMPARISON:  Chest radiograph October 14, 2015 FINDINGS: Cardiomediastinal silhouette is normal. No pleural effusions or focal consolidations. Trachea projects midline and there is no pneumothorax. Soft tissue planes and included osseous structures are non-suspicious. IMPRESSION: Normal chest. Electronically Signed   By: Awilda Metro M.D.   On: 10/21/2016 22:22   Ct Abdomen Pelvis W Contrast  Result Date: 10/22/2016 CLINICAL DATA:  Right lower quadrant pain EXAM: CT ABDOMEN AND PELVIS WITH CONTRAST TECHNIQUE: Multidetector CT imaging of the abdomen and pelvis was performed using the standard protocol following bolus administration of intravenous contrast. CONTRAST:  ISOVUE-300 IOPAMIDOL (ISOVUE-300) INJECTION 61% COMPARISON:  07/19/2016, 11/19/2015 FINDINGS: Lower chest: Lung bases demonstrate no acute consolidation or effusion. Normal heart size Hepatobiliary: No focal liver abnormality is seen. No gallstones, gallbladder wall thickening, or biliary dilatation. Pancreas: Unremarkable. No pancreatic ductal dilatation or surrounding inflammatory changes. Spleen: Normal in size without focal abnormality. Adrenals/Urinary Tract: Adrenal glands are unremarkable. Kidneys are normal, without renal calculi, focal lesion, or hydronephrosis. Bladder is unremarkable. Stomach/Bowel: Stomach nonenlarged.  No  dilated small bowel. There is thickening of the terminal ileum and the ileocecal valve. There is wall thickening of the cecum and ascending colon with additional wall thickening of the transverse colon, descending colon. The sigmoid colon and rectum are under distended. The appendix also appears thickened but is non dilated. No soft tissue stranding is present around the appendix. Vascular/Lymphatic: Multiple prominent right lower quadrant mesenteric lymph nodes. Non aneurysmal aorta Reproductive: Prostate is unremarkable. Other: No free air or free fluid Musculoskeletal: No acute or significant osseous findings. IMPRESSION: 1. Thickening of the terminal ileum, cecum, ascending colon, transverse colon and descending colon consistent with a mild colitis. This may be of infectious or inflammatory etiology. There is mild wall thickening of the appendix however appendix is nonenlarged and there is no surrounding inflammatory change to suggest an acute appendicitis. 2. Right lower quadrant mesenteric lymph nodes Electronically Signed   By: Jasmine Pang M.D.   On: 10/22/2016 01:26       Subjective: Still has some abdominal pain but better. Had 2 episodes of diarrhea yesterday. Tolerating advanced diet.  Discharge Exam: Vitals:   10/24/16 2142 10/25/16 0554  BP: (!) 110/57 (!) 104/51  Pulse: 67 (!) 53  Resp: 18 18  Temp: 98.2 F (36.8 C) 98.1 F (36.7 C)   Vitals:   10/24/16 0607 10/24/16 1459 10/24/16 2142 10/25/16 0554  BP:  (!) 102/47 (!) 110/57 (!) 104/51  Pulse: 60 (!) 52 67 (!) 53  Resp:  16  18 18  Temp:  98.1 F (36.7 C) 98.2 F (36.8 C) 98.1 F (36.7 C)  TempSrc:  Oral Oral Oral  SpO2:  99% 98% 95%  Weight:      Height:        General: Young male not in distress HEENT: Moist mucosa, supple neck Chest: Clear bilaterally CVS: Normal S1-S2, no murmurs GI: Soft, non-distended but tender (mainly infraumbilical) bowel sounds present Musculoskeletal: Warm, no edema     The  results of significant diagnostics from this hospitalization (including imaging, microbiology, ancillary and laboratory) are listed below for reference.     Microbiology: Recent Results (from the past 240 hour(s))  C difficile quick scan w PCR reflex     Status: Abnormal   Collection Time: 10/22/16 11:04 AM  Result Value Ref Range Status   C Diff antigen POSITIVE (A) NEGATIVE Final   C Diff toxin POSITIVE (A) NEGATIVE Final   C Diff interpretation Toxin producing C. difficile detected.  Final    Comment: CRITICAL RESULT CALLED TO, READ BACK BY AND VERIFIED WITH: Cherylann Ratel AT 1222 10/22/16 BY L BENFIELD   Gastrointestinal Panel by PCR , Stool     Status: None   Collection Time: 10/22/16 11:04 AM  Result Value Ref Range Status   Campylobacter species NOT DETECTED NOT DETECTED Final   Plesimonas shigelloides NOT DETECTED NOT DETECTED Final   Salmonella species NOT DETECTED NOT DETECTED Final   Yersinia enterocolitica NOT DETECTED NOT DETECTED Final   Vibrio species NOT DETECTED NOT DETECTED Final   Vibrio cholerae NOT DETECTED NOT DETECTED Final   Enteroaggregative E coli (EAEC) NOT DETECTED NOT DETECTED Final   Enteropathogenic E coli (EPEC) NOT DETECTED NOT DETECTED Final   Enterotoxigenic E coli (ETEC) NOT DETECTED NOT DETECTED Final   Shiga like toxin producing E coli (STEC) NOT DETECTED NOT DETECTED Final   Shigella/Enteroinvasive E coli (EIEC) NOT DETECTED NOT DETECTED Final   Cryptosporidium NOT DETECTED NOT DETECTED Final   Cyclospora cayetanensis NOT DETECTED NOT DETECTED Final   Entamoeba histolytica NOT DETECTED NOT DETECTED Final   Giardia lamblia NOT DETECTED NOT DETECTED Final   Adenovirus F40/41 NOT DETECTED NOT DETECTED Final   Astrovirus NOT DETECTED NOT DETECTED Final   Norovirus GI/GII NOT DETECTED NOT DETECTED Final   Rotavirus A NOT DETECTED NOT DETECTED Final   Sapovirus (I, II, IV, and V) NOT DETECTED NOT DETECTED Final     Labs: BNP (last 3 results) No  results for input(s): BNP in the last 8760 hours. Basic Metabolic Panel:  Recent Labs Lab 10/21/16 2049 10/23/16 0551 10/24/16 0543  NA 135 138 139  K 3.3* 4.0 4.5  CL 105 108 106  CO2 21* 23 28  GLUCOSE 104* 89 95  BUN 6 <5* <5*  CREATININE 0.85 0.89 0.90  CALCIUM 9.3 8.9 9.3   Liver Function Tests:  Recent Labs Lab 10/23/16 0551  AST 18  ALT 15*  ALKPHOS 75  BILITOT 0.4  PROT 6.2*  ALBUMIN 3.3*   No results for input(s): LIPASE, AMYLASE in the last 168 hours. No results for input(s): AMMONIA in the last 168 hours. CBC:  Recent Labs Lab 10/21/16 2049 10/22/16 0542 10/23/16 0551 10/24/16 0543  WBC 20.5* 18.5* 12.7* 10.8*  NEUTROABS 14.4* 11.1*  --   --   HGB 14.6 13.2 13.6 14.2  HCT 41.6 38.7* 40.3 41.5  MCV 83.5 85.2 86.1 86.6  PLT 307 257 280 316   Cardiac Enzymes: No results for input(s):  CKTOTAL, CKMB, CKMBINDEX, TROPONINI in the last 168 hours. BNP: Invalid input(s): POCBNP CBG:  Recent Labs Lab 10/22/16 1216 10/22/16 1848 10/23/16 0008  GLUCAP 81 89 87   D-Dimer No results for input(s): DDIMER in the last 72 hours. Hgb A1c No results for input(s): HGBA1C in the last 72 hours. Lipid Profile No results for input(s): CHOL, HDL, LDLCALC, TRIG, CHOLHDL, LDLDIRECT in the last 72 hours. Thyroid function studies No results for input(s): TSH, T4TOTAL, T3FREE, THYROIDAB in the last 72 hours.  Invalid input(s): FREET3 Anemia work up No results for input(s): VITAMINB12, FOLATE, FERRITIN, TIBC, IRON, RETICCTPCT in the last 72 hours. Urinalysis    Component Value Date/Time   COLORURINE YELLOW 10/21/2016 2041   APPEARANCEUR CLEAR 10/21/2016 2041   LABSPEC 1.014 10/21/2016 2041   PHURINE 7.0 10/21/2016 2041   GLUCOSEU NEGATIVE 10/21/2016 2041   HGBUR MODERATE (A) 10/21/2016 2041   BILIRUBINUR NEGATIVE 10/21/2016 2041   KETONESUR 15 (A) 10/21/2016 2041   PROTEINUR NEGATIVE 10/21/2016 2041   UROBILINOGEN 0.2 12/09/2013 0330   NITRITE NEGATIVE  10/21/2016 2041   LEUKOCYTESUR TRACE (A) 10/21/2016 2041   Sepsis Labs Invalid input(s): PROCALCITONIN,  WBC,  LACTICIDVEN Microbiology Recent Results (from the past 240 hour(s))  C difficile quick scan w PCR reflex     Status: Abnormal   Collection Time: 10/22/16 11:04 AM  Result Value Ref Range Status   C Diff antigen POSITIVE (A) NEGATIVE Final   C Diff toxin POSITIVE (A) NEGATIVE Final   C Diff interpretation Toxin producing C. difficile detected.  Final    Comment: CRITICAL RESULT CALLED TO, READ BACK BY AND VERIFIED WITH: Cherylann RatelG YORRICK,RN AT 1222 10/22/16 BY L BENFIELD   Gastrointestinal Panel by PCR , Stool     Status: None   Collection Time: 10/22/16 11:04 AM  Result Value Ref Range Status   Campylobacter species NOT DETECTED NOT DETECTED Final   Plesimonas shigelloides NOT DETECTED NOT DETECTED Final   Salmonella species NOT DETECTED NOT DETECTED Final   Yersinia enterocolitica NOT DETECTED NOT DETECTED Final   Vibrio species NOT DETECTED NOT DETECTED Final   Vibrio cholerae NOT DETECTED NOT DETECTED Final   Enteroaggregative E coli (EAEC) NOT DETECTED NOT DETECTED Final   Enteropathogenic E coli (EPEC) NOT DETECTED NOT DETECTED Final   Enterotoxigenic E coli (ETEC) NOT DETECTED NOT DETECTED Final   Shiga like toxin producing E coli (STEC) NOT DETECTED NOT DETECTED Final   Shigella/Enteroinvasive E coli (EIEC) NOT DETECTED NOT DETECTED Final   Cryptosporidium NOT DETECTED NOT DETECTED Final   Cyclospora cayetanensis NOT DETECTED NOT DETECTED Final   Entamoeba histolytica NOT DETECTED NOT DETECTED Final   Giardia lamblia NOT DETECTED NOT DETECTED Final   Adenovirus F40/41 NOT DETECTED NOT DETECTED Final   Astrovirus NOT DETECTED NOT DETECTED Final   Norovirus GI/GII NOT DETECTED NOT DETECTED Final   Rotavirus A NOT DETECTED NOT DETECTED Final   Sapovirus (I, II, IV, and V) NOT DETECTED NOT DETECTED Final     Time coordinating discharge: <30  minutes  SIGNED:   Eddie NorthHUNGEL, Yuvia Plant, MD  [facility] 10/25/2016, 2:50 PM Pager   If 7PM-7AM, please contact night-coverage [email or web address] Password [email or web address]

## 2016-11-21 ENCOUNTER — Other Ambulatory Visit: Payer: Self-pay | Admitting: Gastroenterology

## 2016-11-21 DIAGNOSIS — R1084 Generalized abdominal pain: Secondary | ICD-10-CM

## 2016-11-26 ENCOUNTER — Other Ambulatory Visit: Payer: Self-pay

## 2016-12-03 ENCOUNTER — Ambulatory Visit
Admission: RE | Admit: 2016-12-03 | Discharge: 2016-12-03 | Disposition: A | Discharge status: Home / Self Care | Payer: Commercial Managed Care - PPO | Source: Ambulatory Visit | Attending: Gastroenterology | Admitting: Gastroenterology

## 2016-12-03 DIAGNOSIS — R1084 Generalized abdominal pain: Secondary | ICD-10-CM

## 2016-12-03 MED ORDER — IOPAMIDOL (ISOVUE-300) INJECTION 61%
100.0000 mL | Freq: Once | INTRAVENOUS | Status: AC | PRN
  Administered 2016-12-03: 100 mL via INTRAVENOUS

## 2017-03-04 ENCOUNTER — Emergency Department (HOSPITAL_COMMUNITY)
Admission: EM | Admit: 2017-03-04 | Discharge: 2017-03-04 | Discharge status: Against Medical Advice | Payer: Commercial Managed Care - PPO

## 2017-03-04 NOTE — ED Triage Notes (Signed)
Pt in triage st's he is not going to stay because the pain is to bad and he can't wait that long.  Pt st's that if I could tell him he would be taken to the back within 1 hour he would stay.  Explained to pt that we would try to get him back as soon as possible.  Advised pt hat he needed to stay.

## 2017-12-16 IMAGING — DX DG CHEST 2V
2 series · 2 of 2 positions shown · non-contrast
Comparison: Chest radiograph October 14, 2015

CLINICAL DATA: Shortness of breath, chest pain, fever, chills for 1
week.

EXAM:
CHEST  2 VIEW

[chest pa]
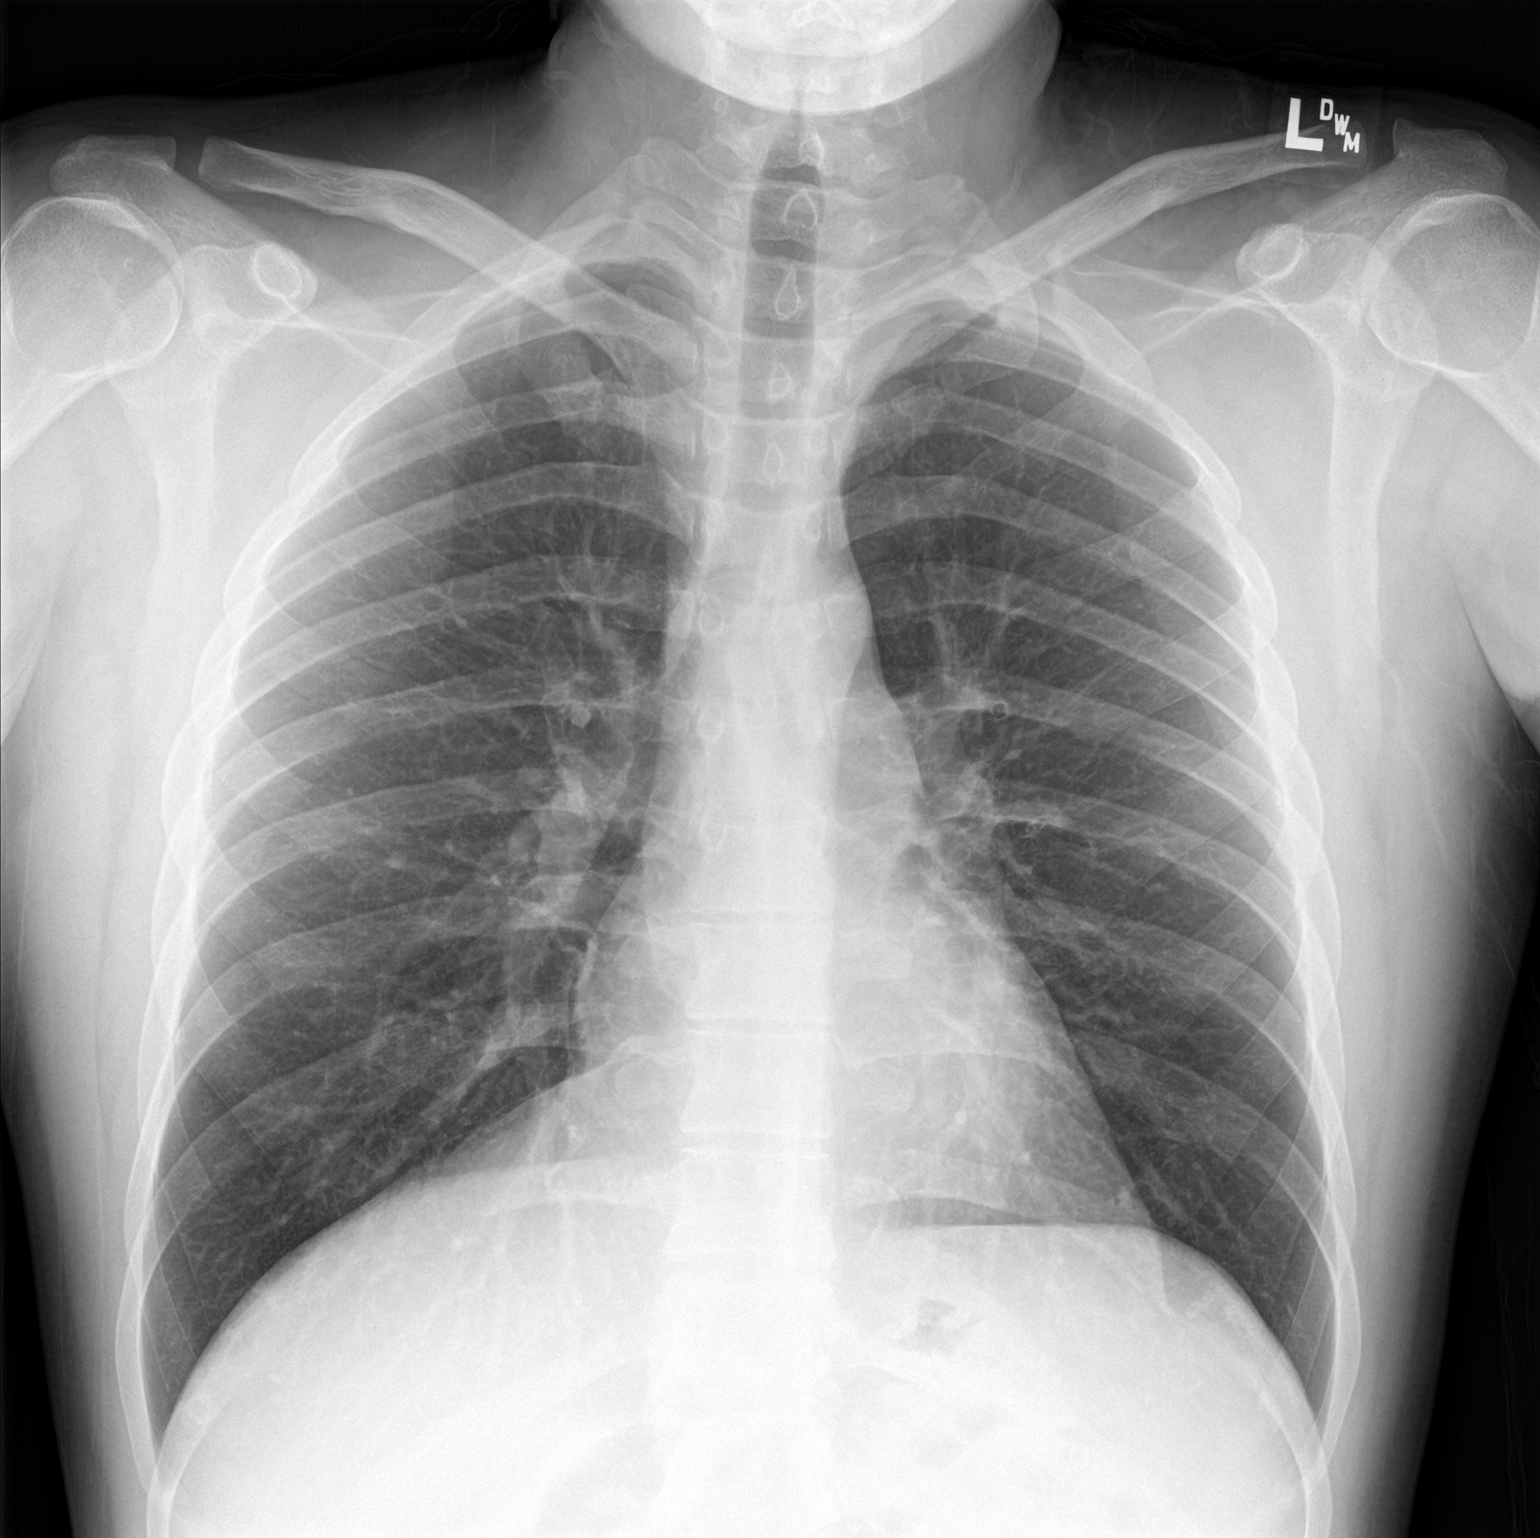

[chest lat]
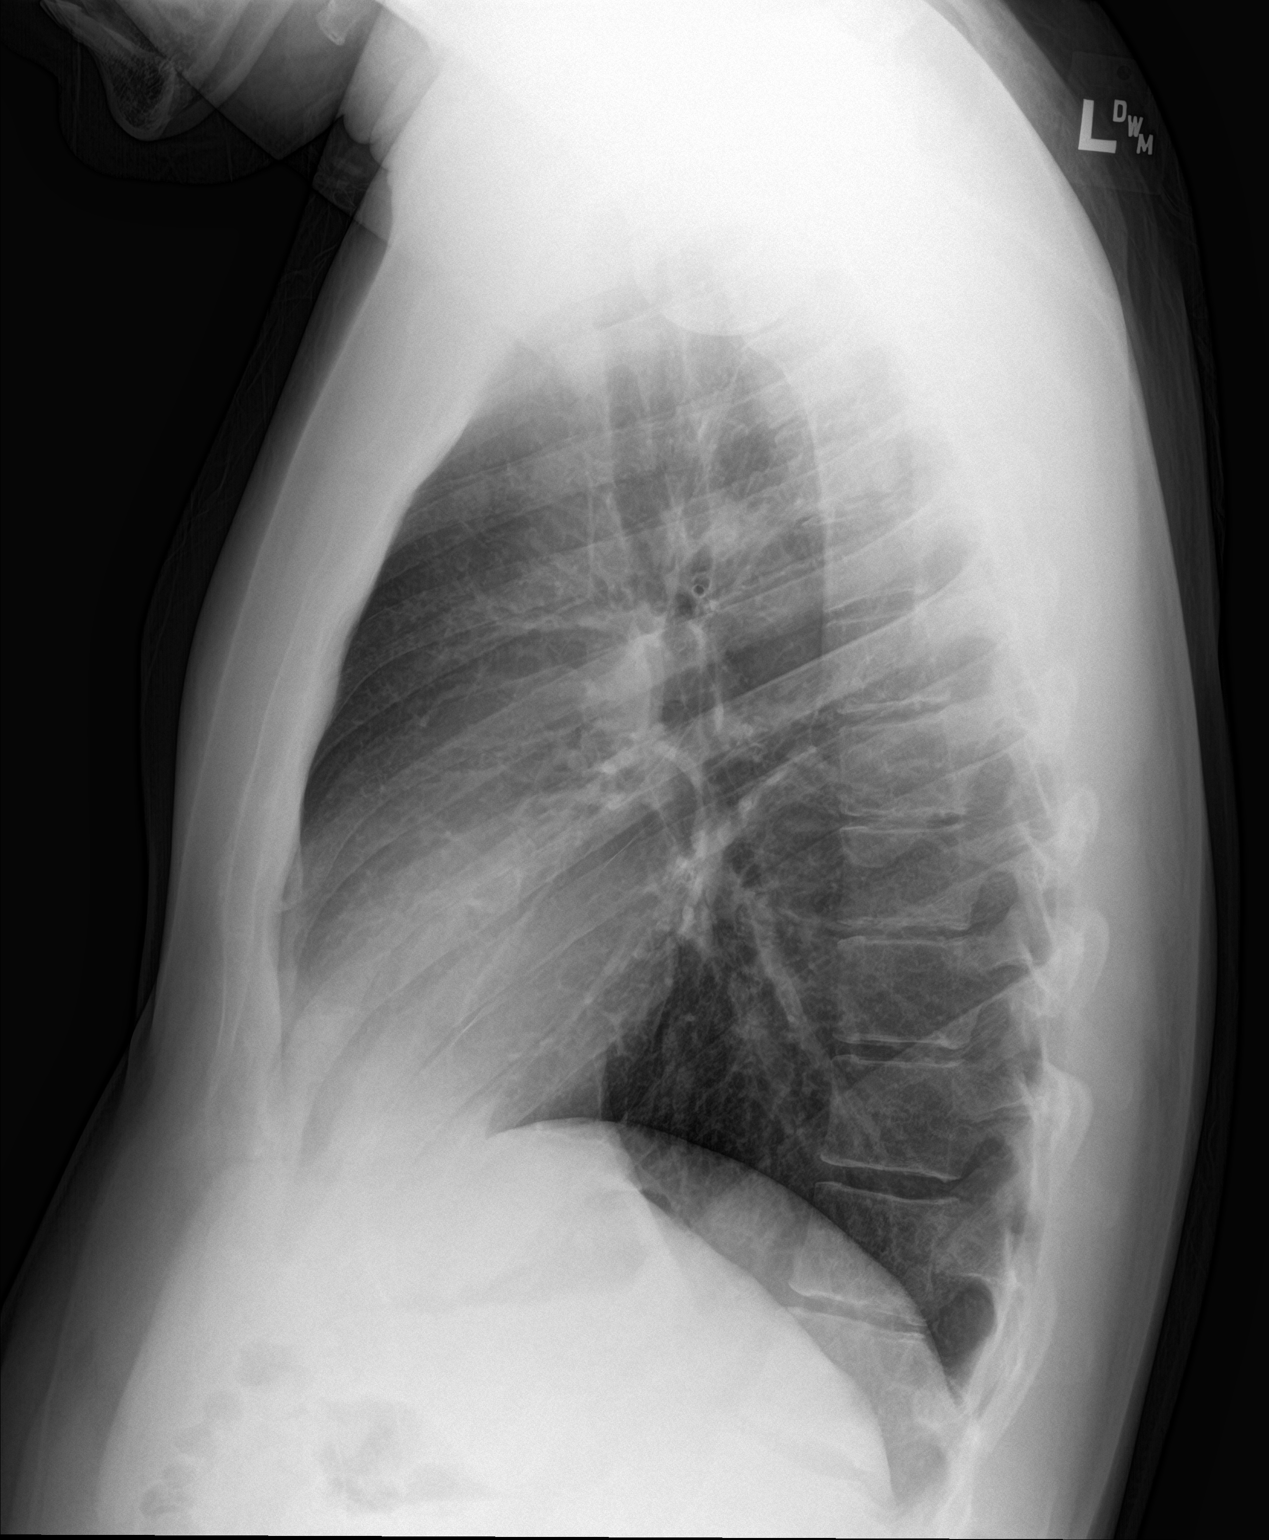

[2 of 2 positions shown; findings below may reference images not displayed]

FINDINGS: Cardiomediastinal silhouette is normal. No pleural effusions or
focal consolidations. Trachea projects midline and there is no
pneumothorax. Soft tissue planes and included osseous structures are
non-suspicious.
IMPRESSION: Normal chest.

## 2018-08-10 IMAGING — CT CT ABD-PELV W/ CM
1 of 2 series · 15 of 32 positions shown, 19 images · IV contrast (APPLIED)
Comparison: 10/22/2016

CLINICAL DATA: Right lower quadrant pain for 2 months. History of c
difficile colitis. Possible ulcerative colitis. Elevated white blood
cell count.

EXAM:
CT ABDOMEN AND PELVIS WITH CONTRAST
TECHNIQUE: Multidetector CT imaging of the abdomen and pelvis was performed
using the standard protocol following bolus administration of
intravenous contrast.
CONTRAST:  100mL [id number] IOPAMIDOL (QGD21X-[id number]) INJECTION 61%

[Series 2: abd/pelvis w/cm · axial · 0.77mm/px · z∈[-480,-35]mm · 15 of 99 slices shown, 19 images]
[im 5/99  soft-tissue]
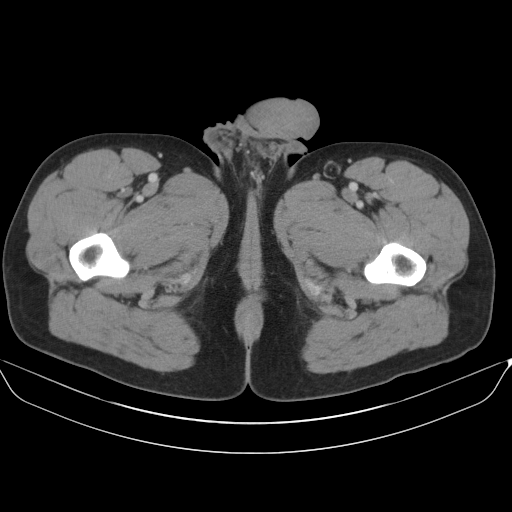
[im 5/99  bone]
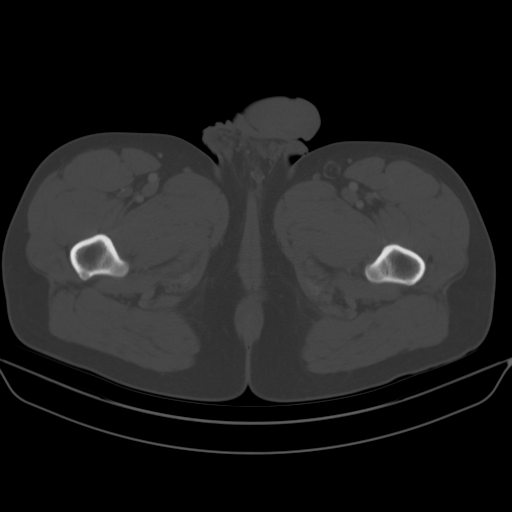
[im 13/99  soft-tissue]
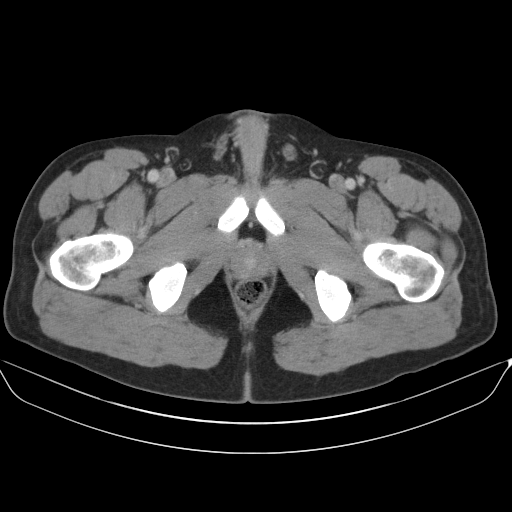
[im 21/99  soft-tissue]
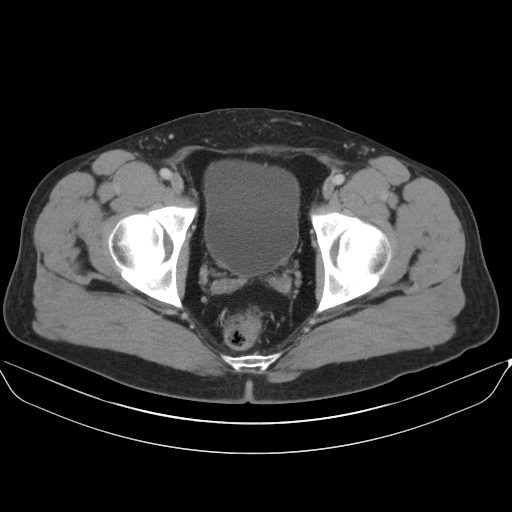
[im 29/99  soft-tissue]
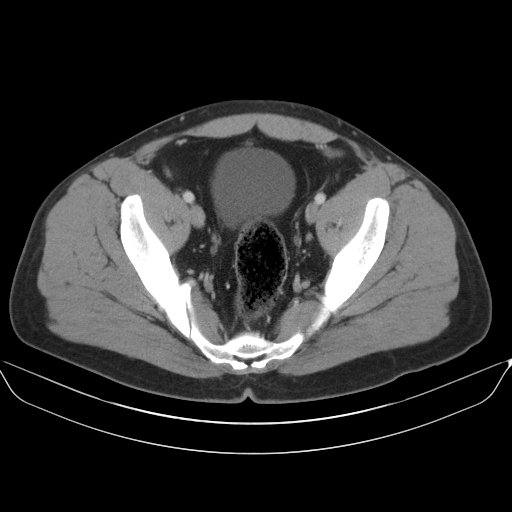
[im 33/99  soft-tissue]
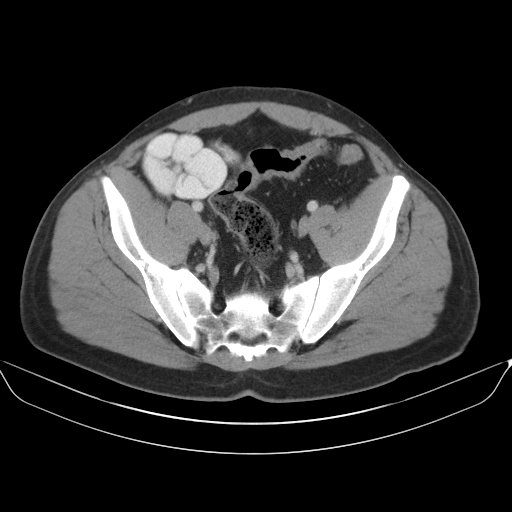
[im 41/99  soft-tissue]
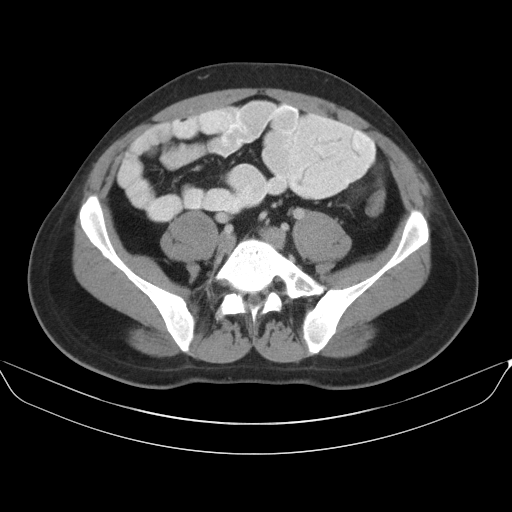
[im 50/99  soft-tissue]
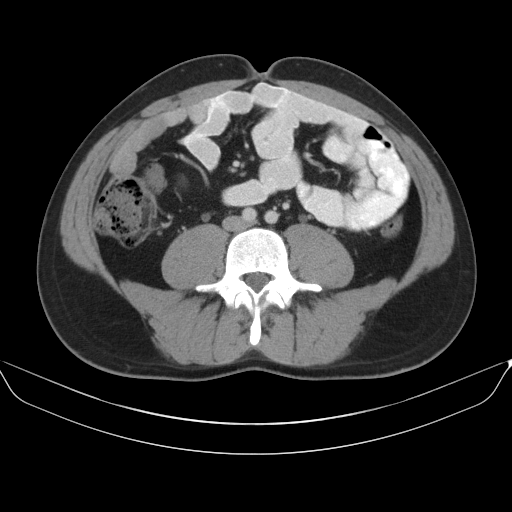
[im 58/99  soft-tissue]
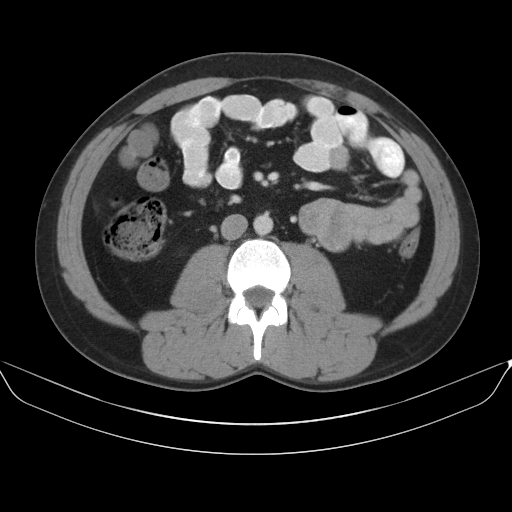
[im 66/99  soft-tissue]
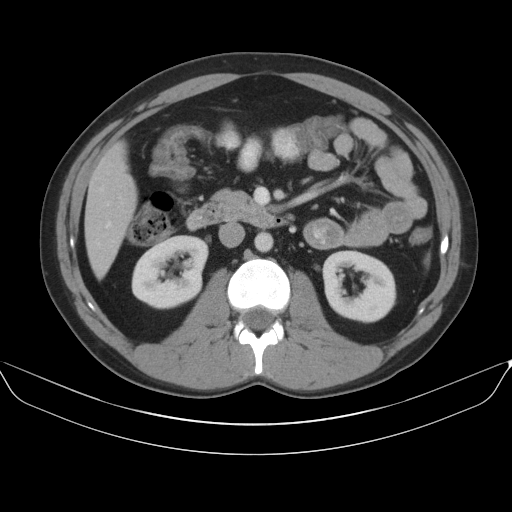
[im 66/99  bone]
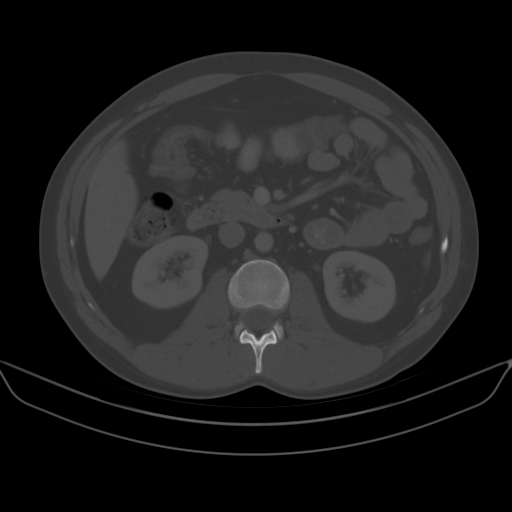
[im 70/99  soft-tissue]
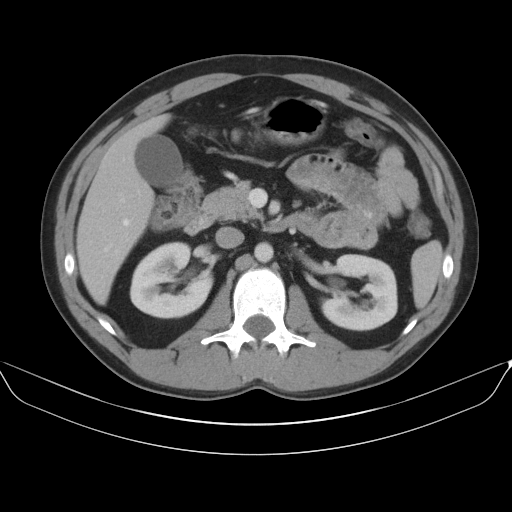
[im 78/99  soft-tissue]
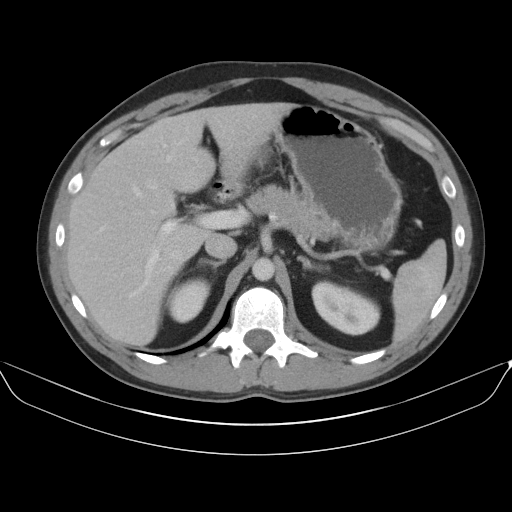
[im 82/99  lung]
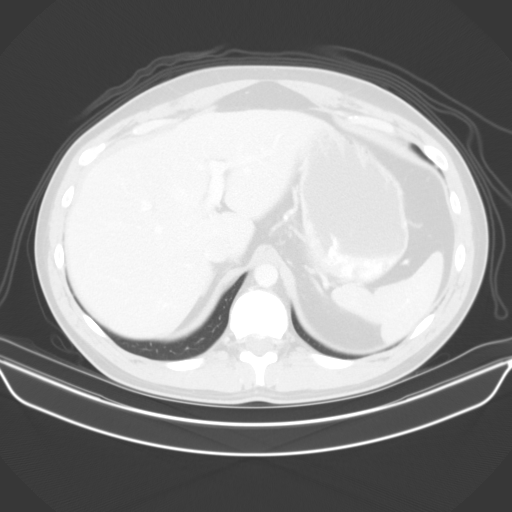
[im 86/99  soft-tissue]
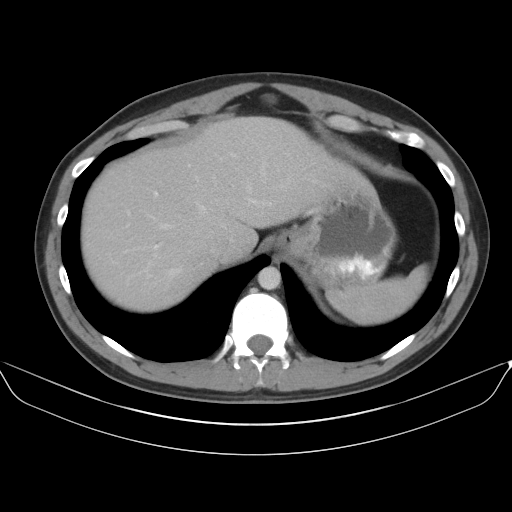
[im 86/99  lung]
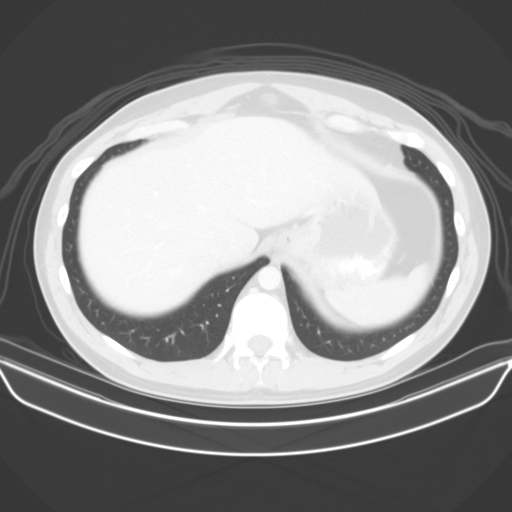
[im 90/99  lung]
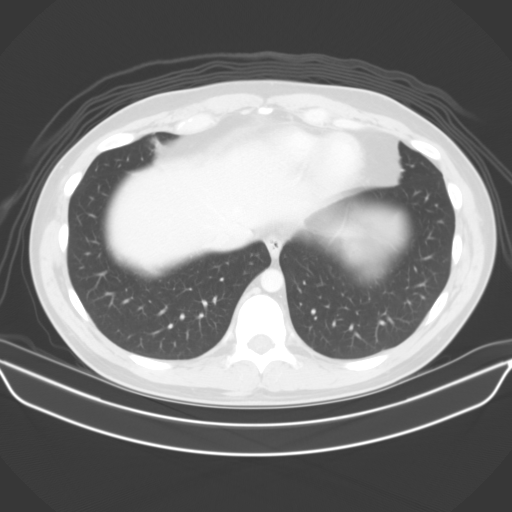
[im 94/99  soft-tissue]
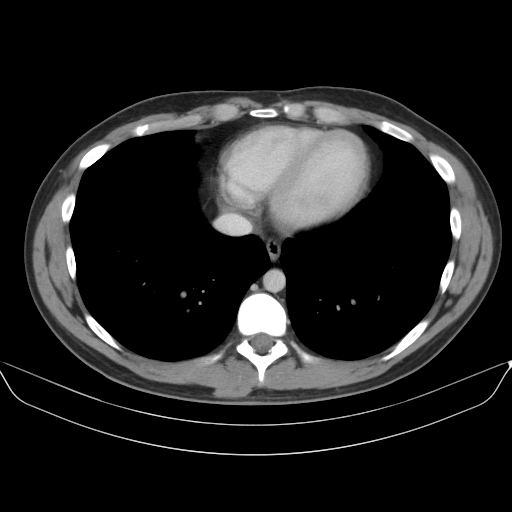
[im 94/99  lung]
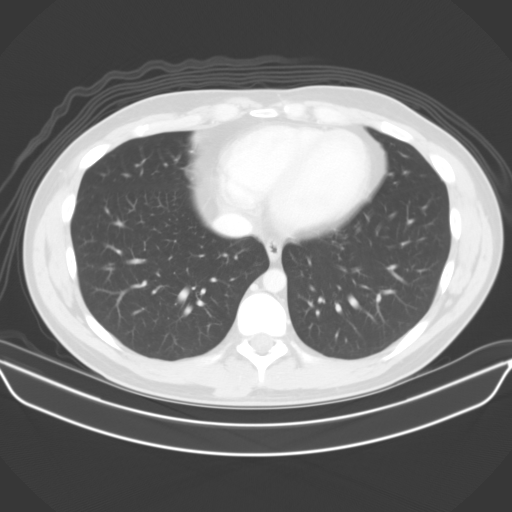

[15 of 32 positions shown; findings below may reference images not displayed]

FINDINGS: Lower chest: Clear lung bases. Normal heart size without pericardial
or pleural effusion.

Hepatobiliary: Normal liver. Normal gallbladder, without biliary
ductal dilatation.

Pancreas: Normal, without mass or ductal dilatation.

Spleen: Normal in size, without focal abnormality.

Adrenals/Urinary Tract: Normal adrenal glands. Normal kidneys,
without hydronephrosis. Normal urinary bladder.

Stomach/Bowel: Normal stomach, without wall thickening. Improved
appearance of the colon. There is underdistention of the transverse
colon, including on image 33/ series 2. Apparent wall thickening is
primarily felt to be secondary. No pericolonic edema. Normal
terminal ileum and appendix. Normal small bowel.

Vascular/Lymphatic: Normal caliber of the aorta and branch vessels.
No abdominopelvic adenopathy.

Reproductive: Normal prostate. Trace bilateral hydroceles are
favored to be physiologic.

Other: No significant free fluid.  No free intraperitoneal air.

Musculoskeletal: Transitional S1 vertebral body.
IMPRESSION: 1. Improved appearance of the colon. Transverse colonic
underdistention. Apparent wall thickening is likely secondary. Mild
residual colitis cannot be excluded.
2.  No acute superimposed process.

## 2021-02-19 DIAGNOSIS — F418 Other specified anxiety disorders: Secondary | ICD-10-CM | POA: Diagnosis present

## 2021-02-19 DIAGNOSIS — D72829 Elevated white blood cell count, unspecified: Secondary | ICD-10-CM | POA: Diagnosis present

## 2021-10-15 DIAGNOSIS — Z20822 Contact with and (suspected) exposure to covid-19: Secondary | ICD-10-CM | POA: Diagnosis not present

## 2021-10-15 DIAGNOSIS — J069 Acute upper respiratory infection, unspecified: Secondary | ICD-10-CM | POA: Diagnosis not present

## 2021-10-15 DIAGNOSIS — R051 Acute cough: Secondary | ICD-10-CM | POA: Diagnosis not present

## 2021-10-20 DIAGNOSIS — R109 Unspecified abdominal pain: Secondary | ICD-10-CM | POA: Diagnosis not present

## 2021-10-20 DIAGNOSIS — R112 Nausea with vomiting, unspecified: Secondary | ICD-10-CM | POA: Diagnosis not present

## 2021-10-20 DIAGNOSIS — R1032 Left lower quadrant pain: Secondary | ICD-10-CM | POA: Diagnosis not present

## 2021-10-28 DIAGNOSIS — R1084 Generalized abdominal pain: Secondary | ICD-10-CM | POA: Diagnosis not present

## 2021-10-28 DIAGNOSIS — R112 Nausea with vomiting, unspecified: Secondary | ICD-10-CM | POA: Diagnosis not present

## 2021-10-28 DIAGNOSIS — R109 Unspecified abdominal pain: Secondary | ICD-10-CM | POA: Diagnosis not present

## 2021-10-28 DIAGNOSIS — Z20822 Contact with and (suspected) exposure to covid-19: Secondary | ICD-10-CM | POA: Diagnosis not present

## 2022-01-02 DIAGNOSIS — R319 Hematuria, unspecified: Secondary | ICD-10-CM | POA: Diagnosis not present

## 2022-01-02 DIAGNOSIS — R361 Hematospermia: Secondary | ICD-10-CM | POA: Diagnosis not present

## 2022-01-02 DIAGNOSIS — R369 Urethral discharge, unspecified: Secondary | ICD-10-CM | POA: Diagnosis not present

## 2022-05-14 ENCOUNTER — Emergency Department (HOSPITAL_BASED_OUTPATIENT_CLINIC_OR_DEPARTMENT_OTHER)
Admission: EM | Admit: 2022-05-14 | Discharge: 2022-05-15 | Disposition: A | Discharge status: Home / Self Care | Payer: BC Managed Care – PPO | Attending: Emergency Medicine | Admitting: Emergency Medicine

## 2022-05-14 ENCOUNTER — Encounter (HOSPITAL_BASED_OUTPATIENT_CLINIC_OR_DEPARTMENT_OTHER): Payer: Self-pay | Admitting: Emergency Medicine

## 2022-05-14 ENCOUNTER — Other Ambulatory Visit: Payer: Self-pay

## 2022-05-14 DIAGNOSIS — R1011 Right upper quadrant pain: Secondary | ICD-10-CM | POA: Insufficient documentation

## 2022-05-14 DIAGNOSIS — R519 Headache, unspecified: Secondary | ICD-10-CM | POA: Diagnosis not present

## 2022-05-14 DIAGNOSIS — R1031 Right lower quadrant pain: Secondary | ICD-10-CM | POA: Diagnosis not present

## 2022-05-14 DIAGNOSIS — R109 Unspecified abdominal pain: Secondary | ICD-10-CM | POA: Diagnosis not present

## 2022-05-14 LAB — URINALYSIS, ROUTINE W REFLEX MICROSCOPIC
Bilirubin Urine: NEGATIVE
Glucose, UA: NEGATIVE mg/dL
Ketones, ur: NEGATIVE mg/dL
Leukocytes,Ua: NEGATIVE
Nitrite: NEGATIVE
Protein, ur: NEGATIVE mg/dL
Specific Gravity, Urine: 1.015 (ref 1.005–1.030)
pH: 7 (ref 5.0–8.0)

## 2022-05-14 LAB — CBC
HCT: 41.3 % (ref 39.0–52.0)
Hemoglobin: 14.6 g/dL (ref 13.0–17.0)
MCH: 30.2 pg (ref 26.0–34.0)
MCHC: 35.4 g/dL (ref 30.0–36.0)
MCV: 85.5 fL (ref 80.0–100.0)
Platelets: 299 10*3/uL (ref 150–400)
RBC: 4.83 MIL/uL (ref 4.22–5.81)
RDW: 12.3 % (ref 11.5–15.5)
WBC: 13.7 10*3/uL — ABNORMAL HIGH (ref 4.0–10.5)
nRBC: 0 % (ref 0.0–0.2)

## 2022-05-14 LAB — COMPREHENSIVE METABOLIC PANEL
ALT: 17 U/L (ref 0–44)
AST: 22 U/L (ref 15–41)
Albumin: 4.4 g/dL (ref 3.5–5.0)
Alkaline Phosphatase: 72 U/L (ref 38–126)
Anion gap: 7 (ref 5–15)
BUN: 12 mg/dL (ref 6–20)
CO2: 23 mmol/L (ref 22–32)
Calcium: 9.3 mg/dL (ref 8.9–10.3)
Chloride: 107 mmol/L (ref 98–111)
Creatinine, Ser: 0.87 mg/dL (ref 0.61–1.24)
GFR, Estimated: >60 mL/min (ref >60)
Glucose, Bld: 82 mg/dL (ref 70–99)
Potassium: 3.8 mmol/L (ref 3.5–5.1)
Sodium: 137 mmol/L (ref 135–145)
Total Bilirubin: 0.6 mg/dL (ref 0.3–1.2)
Total Protein: 7.5 g/dL (ref 6.5–8.1)

## 2022-05-14 LAB — URINALYSIS, MICROSCOPIC (REFLEX): WBC, UA: NONE SEEN WBC/hpf (ref 0–5)

## 2022-05-14 LAB — LIPASE, BLOOD: Lipase: 31 U/L (ref 11–51)

## 2022-05-14 MED ORDER — METOCLOPRAMIDE HCL 5 MG/ML IJ SOLN
10.0000 mg | Freq: Once | INTRAMUSCULAR | Status: AC
  Administered 2022-05-15: 10 mg via INTRAVENOUS
  Filled 2022-05-14: qty 2

## 2022-05-14 MED ORDER — SODIUM CHLORIDE 0.9 % IV BOLUS
1000.0000 mL | Freq: Once | INTRAVENOUS | Status: AC
  Administered 2022-05-15: 1000 mL via INTRAVENOUS

## 2022-05-14 MED ORDER — KETOROLAC TROMETHAMINE 15 MG/ML IJ SOLN
15.0000 mg | Freq: Once | INTRAMUSCULAR | Status: AC
  Administered 2022-05-15: 15 mg via INTRAVENOUS
  Filled 2022-05-14: qty 1

## 2022-05-14 NOTE — ED Provider Notes (Signed)
[facility] EMERGENCY DEPARTMENT Provider Note   CSN: [id number] Arrival date & time: 05/14/22  1951     History {Add pertinent medical, surgical, social history, OB history to HPI:1} Chief Complaint  Patient presents with   Abdominal Pain   Chest Pain    Nathan Daugherty is a 31 y.o. male.  The history is provided by the patient and medical records.  Abdominal Pain Associated symptoms: chest pain   Chest Pain Associated symptoms: abdominal pain   Nathan Daugherty is a 31 y.o. male who presents to the Emergency Department complaining of abdominal pain.   Right retro=orbital pain.  HA.  Woke up panicked.  Left arm numb tingling.blurred vision. Tongue and mouth numb.  Upset stomach yestserday.  Well when get off work at [employer].  [employer]. Bad at [age]. Laid down. Right sided abominal pain. Right lower quadrant raidates to back.  Hx.o kidney stones.    No fever, vomiting.  Very nausea.  Has diarrhea - mild.  Urine smells different, no dysuria.   No known sick contacts.        Home Medications Prior to Admission medications   Medication Sig Start Date End Date Taking? Authorizing Provider  acetaminophen (TYLENOL) 325 MG tablet Take 650 mg by mouth every 4 (four) hours as needed for moderate pain or headache.    [provider]  chlorpheniramine-HYDROcodone (TUSSIONEX) 10-8 MG/5ML SUER Take 5 mLs by mouth at bedtime as needed for cough.  10/14/16   [provider]  feeding supplement (BOOST / RESOURCE BREEZE) LIQD Take 1 Container by mouth 3 (three) times daily between meals. 10/25/16   Dhungel, Theda Belfast, MD  ibuprofen (ADVIL,MOTRIN) 200 MG tablet Take 400 mg by mouth every 6 (six) hours as needed for headache or moderate pain.     [provider]  ondansetron (ZOFRAN) 4 MG tablet Take 1 tablet (4 mg total) by mouth every 8 (eight) hours as needed for nausea or vomiting. 10/25/16   Dhungel, Theda Belfast, MD  oxyCODONE-acetaminophen (PERCOCET) 5-325 MG tablet  Take 2 tablets by mouth every 6 (six) hours as needed. 10/25/16   Dhungel, Theda Belfast, MD  saccharomyces boulardii (FLORASTOR) 250 MG capsule Take 1 capsule (250 mg total) by mouth 2 (two) times daily. 10/25/16   Dhungel, Theda Belfast, MD      Allergies    Patient has no known allergies.    Review of Systems   Review of Systems  Cardiovascular:  Positive for chest pain.  Gastrointestinal:  Positive for abdominal pain.  All other systems reviewed and are negative.   Physical Exam Updated Vital Signs BP (!) 153/96 (BP Location: Right Arm)   Pulse 88   Temp 98.3 F (36.8 C)   Resp 20   Ht 5\' 11"  (1.803 m)   Wt 83 kg   SpO2 98%   BMI 25.52 kg/m  Physical Exam Vitals and nursing note reviewed.  Constitutional:      Appearance: He is well-developed.  HENT:     Head: Normocephalic and atraumatic.  Cardiovascular:     Rate and Rhythm: Normal rate and regular rhythm.     Heart sounds: No murmur heard. Pulmonary:     Effort: Pulmonary effort is normal. No respiratory distress.     Breath sounds: Normal breath sounds.  Abdominal:     Palpations: Abdomen is soft.     Tenderness: There is no guarding or rebound.     Comments: Moderate tenderness over RLQ/RUQ  Musculoskeletal:  General: No tenderness.  Skin:    General: Skin is warm and dry.  Neurological:     Mental Status: He is alert and oriented to person, place, and time.  Psychiatric:        Behavior: Behavior normal.     ED Results / Procedures / Treatments   Labs (all labs ordered are listed, but only abnormal results are displayed) Labs Reviewed  CBC - Abnormal; Notable for the following components:      Result Value   WBC 13.7 (*)    All other components within normal limits  URINALYSIS, ROUTINE W REFLEX MICROSCOPIC - Abnormal; Notable for the following components:   Hgb urine dipstick TRACE (*)    All other components within normal limits  URINALYSIS, MICROSCOPIC (REFLEX) - Abnormal; Notable for the following  components:   Bacteria, UA RARE (*)    All other components within normal limits  LIPASE, BLOOD  COMPREHENSIVE METABOLIC PANEL    EKG None  Radiology No results found.  Procedures Procedures  {Document cardiac monitor, telemetry assessment procedure when appropriate:1}  Medications Ordered in ED Medications - No data to display  ED Course/ Medical Decision Making/ A&P                           Medical Decision Making Amount and/or Complexity of Data Reviewed Labs: ordered.   ***  {Document critical care time when appropriate:1} {Document review of labs and clinical decision tools ie heart score, Chads2Vasc2 etc:1}  {Document your independent review of radiology images, and any outside records:1} {Document your discussion with family members, caretakers, and with consultants:1} {Document social determinants of health affecting pt's care:1} {Document your decision making why or why not admission, treatments were needed:1} Final Clinical Impression(s) / ED Diagnoses Final diagnoses:  None    Rx / DC Orders ED Discharge Orders     None

## 2022-05-14 NOTE — ED Triage Notes (Signed)
Pt complains of RLQ pain radiates to back, Chest pain and sob started today. Pt states he has anxiety and has also had numbness in lips. Denies diarrhea. Nausea started yesterday. Denies any urinary complaints. Pt states head was hurting so bad nothing has helped and on the way to the hospital felt disoriented. Pt A&O x 4 during triage.

## 2022-05-15 ENCOUNTER — Emergency Department (HOSPITAL_BASED_OUTPATIENT_CLINIC_OR_DEPARTMENT_OTHER): Payer: BC Managed Care – PPO

## 2022-05-15 DIAGNOSIS — R109 Unspecified abdominal pain: Secondary | ICD-10-CM | POA: Diagnosis not present

## 2022-05-15 DIAGNOSIS — R519 Headache, unspecified: Secondary | ICD-10-CM | POA: Diagnosis not present

## 2022-05-15 MED ORDER — DROPERIDOL 2.5 MG/ML IJ SOLN
2.5000 mg | Freq: Once | INTRAMUSCULAR | Status: AC
  Administered 2022-05-15: 2.5 mg via INTRAVENOUS
  Filled 2022-05-15: qty 2

## 2022-05-15 MED ORDER — FAMOTIDINE IN NACL 20-0.9 MG/50ML-% IV SOLN
20.0000 mg | Freq: Once | INTRAVENOUS | Status: AC
Start: 2022-05-15 — End: 2022-05-15
  Administered 2022-05-15: 20 mg via INTRAVENOUS
  Filled 2022-05-15: qty 50

## 2022-05-15 MED ORDER — IOHEXOL 300 MG/ML  SOLN
100.0000 mL | Freq: Once | INTRAMUSCULAR | Status: AC | PRN
Start: 2022-05-15 — End: 2022-05-15
  Administered 2022-05-15: 100 mL via INTRAVENOUS

## 2022-05-15 MED ORDER — ALUM & MAG HYDROXIDE-SIMETH 200-200-20 MG/5ML PO SUSP
30.0000 mL | Freq: Once | ORAL | Status: AC
  Administered 2022-05-15: 30 mL via ORAL
  Filled 2022-05-15: qty 30

## 2022-07-07 ENCOUNTER — Encounter (HOSPITAL_BASED_OUTPATIENT_CLINIC_OR_DEPARTMENT_OTHER): Payer: Self-pay | Admitting: Emergency Medicine

## 2022-07-07 ENCOUNTER — Emergency Department (HOSPITAL_BASED_OUTPATIENT_CLINIC_OR_DEPARTMENT_OTHER)
Admission: EM | Admit: 2022-07-07 | Discharge: 2022-07-08 | Disposition: A | Discharge status: Home / Self Care | Payer: Medicaid Other | Attending: Student | Admitting: Student

## 2022-07-07 ENCOUNTER — Emergency Department (HOSPITAL_BASED_OUTPATIENT_CLINIC_OR_DEPARTMENT_OTHER): Payer: Medicaid Other

## 2022-07-07 ENCOUNTER — Other Ambulatory Visit: Payer: Self-pay

## 2022-07-07 DIAGNOSIS — Z87891 Personal history of nicotine dependence: Secondary | ICD-10-CM | POA: Diagnosis not present

## 2022-07-07 DIAGNOSIS — R519 Headache, unspecified: Secondary | ICD-10-CM | POA: Diagnosis not present

## 2022-07-07 DIAGNOSIS — R4182 Altered mental status, unspecified: Secondary | ICD-10-CM | POA: Insufficient documentation

## 2022-07-07 DIAGNOSIS — G43909 Migraine, unspecified, not intractable, without status migrainosus: Secondary | ICD-10-CM

## 2022-07-07 LAB — CBC WITH DIFFERENTIAL/PLATELET
Abs Immature Granulocytes: 0.06 10*3/uL (ref 0.00–0.07)
Basophils Absolute: 0.1 10*3/uL (ref 0.0–0.1)
Basophils Relative: 0 %
Eosinophils Absolute: 0.2 10*3/uL (ref 0.0–0.5)
Eosinophils Relative: 1 %
HCT: 41.1 % (ref 39.0–52.0)
Hemoglobin: 14.3 g/dL (ref 13.0–17.0)
Immature Granulocytes: 0 %
Lymphocytes Relative: 24 %
Lymphs Abs: 4.1 10*3/uL — ABNORMAL HIGH (ref 0.7–4.0)
MCH: 29.8 pg (ref 26.0–34.0)
MCHC: 34.8 g/dL (ref 30.0–36.0)
MCV: 85.6 fL (ref 80.0–100.0)
Monocytes Absolute: 1.5 10*3/uL — ABNORMAL HIGH (ref 0.1–1.0)
Monocytes Relative: 9 %
Neutro Abs: 11.1 10*3/uL — ABNORMAL HIGH (ref 1.7–7.7)
Neutrophils Relative %: 66 %
Platelets: 255 10*3/uL (ref 150–400)
RBC: 4.8 MIL/uL (ref 4.22–5.81)
RDW: 12.1 % (ref 11.5–15.5)
WBC: 17 10*3/uL — ABNORMAL HIGH (ref 4.0–10.5)
nRBC: 0 % (ref 0.0–0.2)

## 2022-07-07 LAB — COMPREHENSIVE METABOLIC PANEL
ALT: 16 U/L (ref 0–44)
AST: 17 U/L (ref 15–41)
Albumin: 4.2 g/dL (ref 3.5–5.0)
Alkaline Phosphatase: 64 U/L (ref 38–126)
Anion gap: 8 (ref 5–15)
BUN: 12 mg/dL (ref 6–20)
CO2: 24 mmol/L (ref 22–32)
Calcium: 8.9 mg/dL (ref 8.9–10.3)
Chloride: 104 mmol/L (ref 98–111)
Creatinine, Ser: 1.05 mg/dL (ref 0.61–1.24)
GFR, Estimated: >60 mL/min (ref >60)
Glucose, Bld: 171 mg/dL — ABNORMAL HIGH (ref 70–99)
Potassium: 3.4 mmol/L — ABNORMAL LOW (ref 3.5–5.1)
Sodium: 136 mmol/L (ref 135–145)
Total Bilirubin: 0.5 mg/dL (ref 0.3–1.2)
Total Protein: 7.4 g/dL (ref 6.5–8.1)

## 2022-07-07 LAB — ETHANOL: Alcohol, Ethyl (B): <10 mg/dL (ref <10)

## 2022-07-07 MED ORDER — PROCHLORPERAZINE EDISYLATE 10 MG/2ML IJ SOLN
10.0000 mg | Freq: Once | INTRAMUSCULAR | Status: AC
  Administered 2022-07-07: 10 mg via INTRAVENOUS
  Filled 2022-07-07: qty 2

## 2022-07-07 MED ORDER — DIPHENHYDRAMINE HCL 50 MG/ML IJ SOLN
25.0000 mg | Freq: Once | INTRAMUSCULAR | Status: AC
  Administered 2022-07-07: 25 mg via INTRAVENOUS
  Filled 2022-07-07: qty 1

## 2022-07-07 MED ORDER — IOHEXOL 350 MG/ML SOLN
75.0000 mL | Freq: Once | INTRAVENOUS | Status: AC | PRN
  Administered 2022-07-07: 75 mL via INTRAVENOUS

## 2022-07-07 MED ORDER — LACTATED RINGERS IV BOLUS
1000.0000 mL | Freq: Once | INTRAVENOUS | Status: AC
  Administered 2022-07-07: 1000 mL via INTRAVENOUS

## 2022-07-07 NOTE — ED Provider Notes (Signed)
[place] EMERGENCY DEPARTMENT Provider Note  CSN: [id number] Arrival date & time: 07/07/22 2207  Chief Complaint(s) Headache  HPI Nathan Daugherty is a 31 y.o. male who presents emergency department for evaluation of headache and altered mental status.  Patient has had multiple presentations at different hospitals with abnormal migraine-like pictures including abdominal and ocular migraines.  He was seen at [facility] yesterday and was so altered and combative that he actually required chemical restraint with Zyprexa.  Work-up overall reassuring yesterday but he was asked to stay for MRI of the brain.  Patient ultimately left the emergency department with outpatient neurologic follow-up and MRI scheduled for next week.  Today, the patient apparently woke from sleep, began to feel very anxious, had an episode of bilateral upper extremity numbness that then transition to a headache and patient arrives tearful and agitated but is overall answering questions appropriately.  He does not appear to be altered here in the emergency department.  Denies current numbness, tingling, weakness, visual deficits or other systemic symptoms.   Past Medical History Past Medical History:  Diagnosis Date   IBD (inflammatory bowel disease)    Patient Active Problem List   Diagnosis Date Noted   Colitis, Clostridium difficile 10/25/2016   Hypokalemia 10/22/2016   Home Medication(s) Prior to Admission medications   Medication Sig Start Date End Date Taking? Authorizing Provider  acetaminophen (TYLENOL) 325 MG tablet Take 650 mg by mouth every 4 (four) hours as needed for moderate pain or headache.    [provider]  chlorpheniramine-HYDROcodone (TUSSIONEX) 10-8 MG/5ML SUER Take 5 mLs by mouth at bedtime as needed for cough.  10/14/16   [provider]  feeding supplement (BOOST / RESOURCE BREEZE) LIQD Take 1 Container by mouth 3 (three) times daily between meals. 10/25/16   Dhungel, Flonnie Overman, MD   ibuprofen (ADVIL,MOTRIN) 200 MG tablet Take 400 mg by mouth every 6 (six) hours as needed for headache or moderate pain.     [provider]  ondansetron (ZOFRAN) 4 MG tablet Take 1 tablet (4 mg total) by mouth every 8 (eight) hours as needed for nausea or vomiting. 10/25/16   Dhungel, Flonnie Overman, MD  oxyCODONE-acetaminophen (PERCOCET) 5-325 MG tablet Take 2 tablets by mouth every 6 (six) hours as needed. 10/25/16   Dhungel, Flonnie Overman, MD  saccharomyces boulardii (FLORASTOR) 250 MG capsule Take 1 capsule (250 mg total) by mouth 2 (two) times daily. 10/25/16   Dhungel, Flonnie Overman, MD                                                                                                                                    Past Surgical History Past Surgical History:  Procedure Laterality Date   HYPOSPADIAS CORRECTION     Family History Family History  Problem Relation Age of Onset   Breast cancer Mother    Diabetes Mellitus II Maternal Grandmother    Breast cancer Maternal Grandmother  Lung cancer Maternal Grandfather     Social History Social History   Tobacco Use   Smoking status: Former    Packs/day: 0.50    Types: Cigarettes   Smokeless tobacco: Never  Substance Use Topics   Alcohol use: Not Currently    Comment: occ   Drug use: No   Allergies Patient has no known allergies.  Review of Systems Review of Systems  Neurological:  Positive for headaches.    Physical Exam Vital Signs  I have reviewed the triage vital signs BP 132/82 (BP Location: Right Arm)   Pulse 69   Temp 98.3 F (36.8 C) (Oral)   Resp 18   Wt 81.6 kg   SpO2 100%   BMI 25.10 kg/m   Physical Exam Constitutional:      General: He is not in acute distress.    Appearance: Normal appearance.  HENT:     Head: Normocephalic and atraumatic.     Nose: No congestion or rhinorrhea.  Eyes:     General:        Right eye: No discharge.        Left eye: No discharge.     Extraocular Movements: Extraocular  movements intact.     Pupils: Pupils are equal, round, and reactive to light.  Cardiovascular:     Rate and Rhythm: Normal rate and regular rhythm.     Heart sounds: No murmur heard. Pulmonary:     Effort: No respiratory distress.     Breath sounds: No wheezing or rales.  Abdominal:     General: There is no distension.     Tenderness: There is no abdominal tenderness.  Musculoskeletal:        General: Normal range of motion.     Cervical back: Normal range of motion.  Skin:    General: Skin is warm and dry.  Neurological:     General: No focal deficit present.     Mental Status: He is alert.     Cranial Nerves: No cranial nerve deficit.     Sensory: No sensory deficit.     Motor: No weakness.     ED Results and Treatments Labs (all labs ordered are listed, but only abnormal results are displayed) Labs Reviewed  COMPREHENSIVE METABOLIC PANEL  CBC WITH DIFFERENTIAL/PLATELET  TSH  RAPID URINE DRUG SCREEN, HOSP PERFORMED  ETHANOL                                                                                                                          Radiology No results found.  Pertinent labs & imaging results that were available during my care of the patient were reviewed by me and considered in my medical decision making (see MDM for details).  Medications Ordered in ED Medications  prochlorperazine (COMPAZINE) injection 10 mg (has no administration in time range)  diphenhydrAMINE (BENADRYL) injection 25 mg (has no administration in time range)  lactated ringers bolus 1,000 mL (has no administration  in time range)                                                                                                                                     Procedures Procedures  (including critical care time)  Medical Decision Making / ED Course   This patient presents to the ED for concern of headache, this involves an extensive number of treatment options, and is a complaint  that carries with it a high risk of complications and morbidity.  The differential diagnosis includes complex migraine, dural venous sinus thrombosis, meningitis, caffeine withdrawal headache, anti-NDMA receptor encephalitis  MDM: Patient seen in the emergency department for evaluation of the headache and altered mental status.  Physical exam with no focal motor or sensory deficits, no cranial nerve deficits.  Patient is quite anxious and agitated, tearful.  He has no meningismus and has full range of motion of the neck with no fever.  Laboratory work-up with some mild hypokalemia and leukocytosis to 17.0.  It does appear that the patient has a leukocytosis during most of his ER presentations and I do have overall lower suspicion for meningitis given no fever or meningismus in the emergency department today.  I did expand his CT work-up with a CT angio brain and CTV which is currently pending.  We do not have MRI at this facility and thus I cannot facilitate the scan.  At time of signout, patient pending response to headache cocktail and imaging.  If all normal and patient improved, anticipate discharge home with neurologic follow-up.   Additional history obtained: -Additional history obtained from multiple family members -External records from outside source obtained and reviewed including: Chart review including previous notes, labs, imaging, consultation notes   Lab Tests: -I ordered, reviewed, and interpreted labs.   The pertinent results include:   Labs Reviewed  COMPREHENSIVE METABOLIC PANEL  CBC WITH DIFFERENTIAL/PLATELET  TSH  RAPID URINE DRUG SCREEN, HOSP PERFORMED  ETHANOL      Imaging Studies ordered: I ordered imaging studies including CT angio, CTV    Medicines ordered and prescription drug management: Meds ordered this encounter  Medications   prochlorperazine (COMPAZINE) injection 10 mg   diphenhydrAMINE (BENADRYL) injection 25 mg   lactated ringers bolus 1,000 mL     -I have reviewed the patients home medicines and have made adjustments as needed  Critical interventions none   Cardiac Monitoring: The patient was maintained on a cardiac monitor.  I personally viewed and interpreted the cardiac monitored which showed an underlying rhythm of: NSR  Social Determinants of Health:  Factors impacting patients care include: none   Reevaluation: After the interventions noted above, I reevaluated the patient and found that they have :improved  Co morbidities that complicate the patient evaluation  Past Medical History:  Diagnosis Date   IBD (inflammatory bowel disease)       Dispostion: I considered admission for this patient, and  disposition pending completion of imaging studies.  Please see provider signout for continuation of work-up     Final Clinical Impression(s) / ED Diagnoses Final diagnoses:  None     @PCDICTATION @    Teressa Lower, MD 07/08/22 1307

## 2022-07-07 NOTE — ED Triage Notes (Signed)
Was seen at [facility] yesterday.  Reports he had a ct and was supposed to have an MRI but left.  Reports he was better today.  Took tramadol this am.  Wife reports started back confused with slurred speech.  This episode started at 8pm.

## 2022-07-07 NOTE — ED Notes (Signed)
Patient transported to CT 

## 2022-07-08 LAB — TSH: TSH: 0.639 u[IU]/mL (ref 0.350–4.500)

## 2022-07-08 NOTE — ED Provider Notes (Signed)
Care of the patient assumed at the change of shift. Here for headache and ?AMS. Similar presentation at [facility] yesterday but left AMA before getting MRI. Awaiting CTA/CTV which were both normal. Patient is sleeping soundly, arouses to verbal stimuli. Family at bedside informed of results. He has follow up with Neuro and MRI already in process at [facility]. Advised not to drive or try to work until cleared by his Neurologist. Otherwise no emergent condition identified tonight and cleared for discharge.    Truddie Hidden, MD 07/08/22 [phone]

## 2022-07-10 DIAGNOSIS — K588 Other irritable bowel syndrome: Secondary | ICD-10-CM | POA: Diagnosis present

## 2022-08-24 ENCOUNTER — Emergency Department (HOSPITAL_BASED_OUTPATIENT_CLINIC_OR_DEPARTMENT_OTHER): Payer: Medicaid Other

## 2022-08-24 ENCOUNTER — Other Ambulatory Visit: Payer: Self-pay

## 2022-08-24 ENCOUNTER — Encounter (HOSPITAL_BASED_OUTPATIENT_CLINIC_OR_DEPARTMENT_OTHER): Payer: Self-pay | Admitting: Emergency Medicine

## 2022-08-24 ENCOUNTER — Emergency Department (HOSPITAL_BASED_OUTPATIENT_CLINIC_OR_DEPARTMENT_OTHER)
Admission: EM | Admit: 2022-08-24 | Discharge: 2022-08-24 | Disposition: A | Discharge status: Home / Self Care | Payer: Medicaid Other | Attending: Emergency Medicine | Admitting: Emergency Medicine

## 2022-08-24 DIAGNOSIS — I808 Phlebitis and thrombophlebitis of other sites: Secondary | ICD-10-CM

## 2022-08-24 DIAGNOSIS — I82612 Acute embolism and thrombosis of superficial veins of left upper extremity: Secondary | ICD-10-CM | POA: Diagnosis not present

## 2022-08-24 DIAGNOSIS — S40922A Unspecified superficial injury of left upper arm, initial encounter: Secondary | ICD-10-CM | POA: Diagnosis not present

## 2022-08-24 DIAGNOSIS — X58XXXA Exposure to other specified factors, initial encounter: Secondary | ICD-10-CM | POA: Insufficient documentation

## 2022-08-24 HISTORY — DX: Disorder of brain, unspecified: G93.9

## 2022-08-24 HISTORY — DX: Unspecified convulsions: R56.9

## 2022-08-24 MED ORDER — DOXYCYCLINE HYCLATE 100 MG PO CAPS: 100.0000 mg | ORAL_CAPSULE | Freq: Two times a day (BID) | ORAL | 0 refills | Status: AC

## 2022-08-24 MED ORDER — DOXYCYCLINE HYCLATE 100 MG PO CAPS: 100.0000 mg | ORAL_CAPSULE | Freq: Two times a day (BID) | ORAL | 0 refills | Status: DC

## 2022-08-24 NOTE — ED Provider Notes (Signed)
[facility] [place] EMERGENCY DEPARTMENT Provider Note   CSN: [id number] Arrival date & time: 08/24/22  [id number]     History  Chief Complaint  Patient presents with   Arm Injury    Possible issue from recent IV steroids    Nathan Daugherty is a 31 y.o. male.  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  Had IVs in place and IV steroids at this location.  Has been red for about 4 days, placed on keflex and on it for about 3 days and it is worsening in terms of erythema.  No fever.  No nausea, vomiting, cp or dyspnea.  Also notes rash on back since starting steroids, is bothersome to lay on it, sometimes itchy, sometimes painful.  Denies hx of IVDU with mother out of room.   Past Medical History:  Diagnosis Date   Disease of brain    inflammatory brain disease autoimmune related   IBD (inflammatory bowel disease)    Seizure-like activity ([place])      Home Medications Prior to Admission medications   Medication Sig Start Date End Date Taking? Authorizing Provider  cephALEXin (KEFLEX) 500 MG capsule Take 500 mg by mouth 4 (four) times daily.   Yes [provider]  escitalopram (LEXAPRO) 10 MG tablet Take 10 mg by mouth daily.   Yes [provider]  lacosamide (VIMPAT) 200 MG TABS tablet Take 100 mg by mouth 2 (two) times daily.   Yes [provider]  predniSONE (DELTASONE) 20 MG tablet Take 40 mg by  mouth daily with breakfast.   Yes [provider]  acetaminophen (TYLENOL) 325 MG tablet Take 650 mg by mouth every 4 (four) hours as needed for moderate pain or headache.    [provider]  chlorpheniramine-HYDROcodone (TUSSIONEX) 10-8 MG/5ML SUER Take 5 mLs by mouth at bedtime as needed for cough.  10/14/16   [provider]  doxycycline (VIBRAMYCIN) 100 MG capsule Take 1 capsule (100 mg total) by mouth 2 (two) times daily for 7 days. 08/24/22 08/31/22  Gareth Morgan, MD  feeding supplement (BOOST / RESOURCE BREEZE) LIQD Take 1 Container by mouth 3 (three) times daily between meals. 10/25/16   Dhungel, Flonnie Overman, MD  ibuprofen (ADVIL,MOTRIN) 200 MG tablet Take 400 mg by mouth every 6 (six) hours as needed for headache or moderate pain.     [provider]  ondansetron (ZOFRAN) 4 MG tablet Take 1 tablet (4 mg total) by mouth every 8 (eight) hours as needed for nausea or vomiting. 10/25/16   Dhungel, Flonnie Overman, MD  oxyCODONE-acetaminophen (PERCOCET) 5-325 MG tablet Take 2 tablets by mouth every 6 (six) hours as needed. 10/25/16   Dhungel, Flonnie Overman, MD  saccharomyces boulardii (FLORASTOR) 250 MG capsule Take 1 capsule (250 mg total) by mouth 2 (two) times daily. 10/25/16   Dhungel, Flonnie Overman, MD      Allergies    Patient has no known allergies.    Review of Systems   Review of Systems  Physical Exam Updated Vital  Signs BP 109/62 (BP Location: Right Arm)   Pulse 70   Temp 98.5 F (36.9 C) (Oral)   Resp 15   Ht 5\' 11"  (1.803 m)   Wt 81.6 kg   SpO2 98%   BMI 25.10 kg/m  Physical Exam Vitals and nursing note reviewed.  Constitutional:      General: He is not in acute distress.    Appearance: Normal appearance. He is not ill-appearing, toxic-appearing or diaphoretic.  HENT:     Head: Normocephalic.  Eyes:     Conjunctiva/sclera: Conjunctivae normal.  Cardiovascular:     Rate and Rhythm: Normal rate and regular rhythm.     Pulses: Normal pulses.   Pulmonary:     Effort: Pulmonary effort is normal. No respiratory distress.  Musculoskeletal:        General: No deformity or signs of injury.     Cervical back: No rigidity.  Skin:    General: Skin is warm and dry.     Coloration: Skin is not jaundiced or pale.     Findings: Rash (tiny papules scattered over back, area of erythema and tenderness left forearm without fluctuance) present.  Neurological:     General: No focal deficit present.     Mental Status: He is alert and oriented to person, place, and time.     ED Results / Procedures / Treatments   Labs (all labs ordered are listed, but only abnormal results are displayed) Labs Reviewed - No data to display  EKG EKG Interpretation  Date/Time:  Saturday August 24 2022 08:23:18 EST Ventricular Rate:  74 PR Interval:  148 QRS Duration: 102 QT Interval:  373 QTC Calculation: 414 R Axis:   78 Text Interpretation: Sinus rhythm RSR' in V1 or V2, right VCD or RVH No significant change since last tracing Confirmed by Gareth Morgan [phone]) on 08/24/2022 9:30:42 AM  Radiology US Venous Img Upper Uni Left  Result Date: 08/24/2022 CLINICAL DATA:  Mid forearm pain.  Edema. EXAM: LEFT UPPER EXTREMITY VENOUS DOPPLER ULTRASOUND TECHNIQUE: Gray-scale sonography with graded compression, as well as color Doppler and duplex ultrasound were performed to evaluate the upper extremity deep venous system from the level of the subclavian vein and including the jugular, axillary, basilic, radial, ulnar and upper cephalic vein. Spectral Doppler was utilized to evaluate flow at rest and with distal augmentation maneuvers. COMPARISON:  None Available. FINDINGS: Contralateral Subclavian Vein: Respiratory phasicity is normal and symmetric with the symptomatic side. No evidence of thrombus. Normal compressibility. Internal Jugular Vein: No evidence of thrombus. Normal compressibility, respiratory phasicity and response to augmentation. Subclavian Vein:  No evidence of thrombus. Normal compressibility, respiratory phasicity and response to augmentation. Axillary Vein: No evidence of thrombus. Normal compressibility, respiratory phasicity and response to augmentation. Cephalic Vein: No evidence of thrombus. Normal compressibility, respiratory phasicity and response to augmentation. Basilic Vein: There is occlusive thrombus in the mid basilic vein accounting for the patient's symptoms. Brachial Veins: No evidence of thrombus. Normal compressibility, respiratory phasicity and response to augmentation. Radial Veins: No evidence of thrombus. Normal compressibility, respiratory phasicity and response to augmentation. Ulnar Veins: No evidence of thrombus. Normal compressibility, respiratory phasicity and response to augmentation. Venous Reflux:  None visualized. Other Findings:  None visualized. IMPRESSION: There is occlusive thrombus in the mid basilic vein accounting for the patient's symptoms. No other abnormalities. Electronically Signed   By: Dorise Bullion III M.D.   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  Denies hx of IVDU. No fluctuance or findings to suggest underlying abscess.  Denies systemic symptoms. No signs of  arterial occlusion, necrotizing fasciitis or sepsis.   DVT US performed showing no DVT, does show superficial thrombophlebitis/occlusion of basilic vein.  Recommend warm compresses, NSAIDs and PCP follow up. Recommend continued follow up with PCP.   Rash on the back does not have the appearance of SSS, TEN, erythroderma, scabies, RMSF or hives.  Consider acne vulgaris as side effect of steroids or folliculitis.  Will change abx to doxycycline from keflex. Patient discharged in stable condition with understanding of reasons to return.          Final Clinical Impression(s) / ED Diagnoses Final diagnoses:  Superficial thrombophlebitis of left upper extremity    Rx / DC Orders ED Discharge Orders          Ordered    doxycycline (VIBRAMYCIN) 100 MG capsule  2 times daily,   Status:  Discontinued        08/24/22 1059    doxycycline (VIBRAMYCIN) 100 MG capsule  2 times daily        08/24/22 1126              Alvira Monday, MD 08/24/22 2149

## 2022-08-24 NOTE — ED Triage Notes (Signed)
Patient reports possible reaction from steroids given by IV while in hospital. Patient had 1st Seizure in the beginning of October 3rd. Patient was found to have inflammation in brain and is on continuous steroids. Patient also c/o body rash since being on steroids. Patient has had a total 3 seizures last one was 07/26/2022. Patient presents with redness to left forearm outside circled area from recent urgent care visit.

## 2022-08-24 NOTE — ED Notes (Signed)
Seizure pads  ?

## 2023-01-09 ENCOUNTER — Other Ambulatory Visit: Payer: Self-pay

## 2023-01-09 ENCOUNTER — Emergency Department (HOSPITAL_BASED_OUTPATIENT_CLINIC_OR_DEPARTMENT_OTHER): Payer: Medicaid Other

## 2023-01-09 ENCOUNTER — Encounter (HOSPITAL_BASED_OUTPATIENT_CLINIC_OR_DEPARTMENT_OTHER): Payer: Self-pay | Admitting: *Deleted

## 2023-01-09 ENCOUNTER — Emergency Department (HOSPITAL_BASED_OUTPATIENT_CLINIC_OR_DEPARTMENT_OTHER)
Admission: EM | Admit: 2023-01-09 | Discharge: 2023-01-09 | Disposition: A | Discharge status: Home / Self Care | Payer: Medicaid Other | Attending: Emergency Medicine | Admitting: Emergency Medicine

## 2023-01-09 DIAGNOSIS — D72829 Elevated white blood cell count, unspecified: Secondary | ICD-10-CM | POA: Diagnosis not present

## 2023-01-09 DIAGNOSIS — E860 Dehydration: Secondary | ICD-10-CM

## 2023-01-09 DIAGNOSIS — R55 Syncope and collapse: Secondary | ICD-10-CM | POA: Diagnosis present

## 2023-01-09 DIAGNOSIS — Z1152 Encounter for screening for COVID-19: Secondary | ICD-10-CM | POA: Diagnosis not present

## 2023-01-09 DIAGNOSIS — Z87891 Personal history of nicotine dependence: Secondary | ICD-10-CM | POA: Insufficient documentation

## 2023-01-09 DIAGNOSIS — R1031 Right lower quadrant pain: Secondary | ICD-10-CM | POA: Diagnosis not present

## 2023-01-09 DIAGNOSIS — J3489 Other specified disorders of nose and nasal sinuses: Secondary | ICD-10-CM | POA: Insufficient documentation

## 2023-01-09 LAB — COMPREHENSIVE METABOLIC PANEL
ALT: 19 U/L (ref 0–44)
AST: 20 U/L (ref 15–41)
Albumin: 4.1 g/dL (ref 3.5–5.0)
Alkaline Phosphatase: 72 U/L (ref 38–126)
Anion gap: 8 (ref 5–15)
BUN: 7 mg/dL (ref 6–20)
CO2: 21 mmol/L — ABNORMAL LOW (ref 22–32)
Calcium: 8.7 mg/dL — ABNORMAL LOW (ref 8.9–10.3)
Chloride: 104 mmol/L (ref 98–111)
Creatinine, Ser: 0.9 mg/dL (ref 0.61–1.24)
GFR, Estimated: >60 mL/min (ref >60)
Glucose, Bld: 91 mg/dL (ref 70–99)
Potassium: 3.8 mmol/L (ref 3.5–5.1)
Sodium: 133 mmol/L — ABNORMAL LOW (ref 135–145)
Total Bilirubin: 0.5 mg/dL (ref 0.3–1.2)
Total Protein: 7.3 g/dL (ref 6.5–8.1)

## 2023-01-09 LAB — URINALYSIS, ROUTINE W REFLEX MICROSCOPIC
Bilirubin Urine: NEGATIVE
Glucose, UA: >=500 mg/dL — AB
Ketones, ur: NEGATIVE mg/dL
Leukocytes,Ua: NEGATIVE
Nitrite: NEGATIVE
Protein, ur: NEGATIVE mg/dL
Specific Gravity, Urine: >=1.03 (ref 1.005–1.030)
pH: 6 (ref 5.0–8.0)

## 2023-01-09 LAB — CBC WITH DIFFERENTIAL/PLATELET
Abs Immature Granulocytes: 0.09 10*3/uL — ABNORMAL HIGH (ref 0.00–0.07)
Basophils Absolute: 0.1 10*3/uL (ref 0.0–0.1)
Basophils Relative: 0 %
Eosinophils Absolute: 0.2 10*3/uL (ref 0.0–0.5)
Eosinophils Relative: 1 %
HCT: 44.5 % (ref 39.0–52.0)
Hemoglobin: 15.4 g/dL (ref 13.0–17.0)
Immature Granulocytes: 1 %
Lymphocytes Relative: 23 %
Lymphs Abs: 3.9 10*3/uL (ref 0.7–4.0)
MCH: 29.8 pg (ref 26.0–34.0)
MCHC: 34.6 g/dL (ref 30.0–36.0)
MCV: 86.2 fL (ref 80.0–100.0)
Monocytes Absolute: 2.3 10*3/uL — ABNORMAL HIGH (ref 0.1–1.0)
Monocytes Relative: 13 %
Neutro Abs: 10.8 10*3/uL — ABNORMAL HIGH (ref 1.7–7.7)
Neutrophils Relative %: 62 %
Platelets: 236 10*3/uL (ref 150–400)
RBC: 5.16 MIL/uL (ref 4.22–5.81)
RDW: 12.6 % (ref 11.5–15.5)
WBC: 17.2 10*3/uL — ABNORMAL HIGH (ref 4.0–10.5)
nRBC: 0 % (ref 0.0–0.2)

## 2023-01-09 LAB — URINALYSIS, MICROSCOPIC (REFLEX)

## 2023-01-09 LAB — SARS CORONAVIRUS 2 BY RT PCR: SARS Coronavirus 2 by RT PCR: NEGATIVE

## 2023-01-09 LAB — CBG MONITORING, ED: Glucose-Capillary: 96 mg/dL (ref 70–99)

## 2023-01-09 LAB — D-DIMER, QUANTITATIVE: D-Dimer, Quant: <0.27 ug/mL-FEU (ref 0.00–0.50)

## 2023-01-09 LAB — LIPASE, BLOOD: Lipase: 29 U/L (ref 11–51)

## 2023-01-09 LAB — TROPONIN I (HIGH SENSITIVITY)
Troponin I (High Sensitivity): 2 ng/L (ref <18)
Troponin I (High Sensitivity): 2 ng/L (ref <18)

## 2023-01-09 MED ORDER — ONDANSETRON HCL 4 MG/2ML IJ SOLN
4.0000 mg | Freq: Once | INTRAMUSCULAR | Status: AC
  Administered 2023-01-09: 4 mg via INTRAVENOUS
  Filled 2023-01-09: qty 2

## 2023-01-09 MED ORDER — DICYCLOMINE HCL 20 MG PO TABS: 20.0000 mg | ORAL_TABLET | Freq: Two times a day (BID) | ORAL | 0 refills | Status: DC | PRN

## 2023-01-09 MED ORDER — ONDANSETRON HCL 4 MG PO TABS: 4.0000 mg | ORAL_TABLET | Freq: Four times a day (QID) | ORAL | 0 refills | Status: DC | PRN

## 2023-01-09 MED ORDER — SODIUM CHLORIDE 0.9 % IV BOLUS
1000.0000 mL | Freq: Once | INTRAVENOUS | Status: AC
  Administered 2023-01-09: 1000 mL via INTRAVENOUS

## 2023-01-09 MED ORDER — MORPHINE SULFATE (PF) 4 MG/ML IV SOLN
4.0000 mg | Freq: Once | INTRAVENOUS | Status: AC
  Administered 2023-01-09: 4 mg via INTRAVENOUS
  Filled 2023-01-09: qty 1

## 2023-01-09 MED ORDER — IOHEXOL 300 MG/ML  SOLN
100.0000 mL | Freq: Once | INTRAMUSCULAR | Status: AC | PRN
  Administered 2023-01-09: 100 mL via INTRAVENOUS

## 2023-01-09 MED ORDER — KETOROLAC TROMETHAMINE 15 MG/ML IJ SOLN
15.0000 mg | Freq: Once | INTRAMUSCULAR | Status: AC
  Administered 2023-01-09: 15 mg via INTRAVENOUS
  Filled 2023-01-09: qty 1

## 2023-01-09 MED ORDER — FENTANYL CITRATE PF 50 MCG/ML IJ SOSY
50.0000 ug | PREFILLED_SYRINGE | Freq: Once | INTRAMUSCULAR | Status: AC
  Administered 2023-01-09: 50 ug via INTRAVENOUS
  Filled 2023-01-09: qty 1

## 2023-01-09 MED ORDER — DICYCLOMINE HCL 10 MG PO CAPS
10.0000 mg | ORAL_CAPSULE | Freq: Once | ORAL | Status: AC
  Administered 2023-01-09: 10 mg via ORAL
  Filled 2023-01-09: qty 1

## 2023-01-09 MED ORDER — IBUPROFEN 600 MG PO TABS: 600.0000 mg | ORAL_TABLET | Freq: Four times a day (QID) | ORAL | 0 refills | Status: DC | PRN

## 2023-01-09 NOTE — ED Provider Notes (Signed)
[facility] Provider Note   CSN: [id number] Arrival date & time: 01/09/23  [id number]     History  Chief Complaint  Patient presents with   Near Syncope    Nathan Daugherty is a 32 y.o. male.   Near Syncope   32 year old male presents emergency department with complaints of abdominal pain, near syncope.  Patient states that he began with cough, nasal congestion, sore throat, diarrhea on Monday.  He was subsequently seen in urgent care on Tuesday with negative testing for COVID, influenza, RSV.  States he was sent home with Nasacort, antihistamine and Mucinex Which has helped with his upper respiratory type symptoms, but diarrhea has persisted.  Patient notes onset of right lower abdominal pain beginning last night.  Notes some radiation to right flank area.  States pain has been persistent since onset.  Notes feelings of nausea with no bouts of emesis.  Patient reports he has had diarrhea occurring 4-5 times daily since onset.  Reports decreased p.o. intake due to increasing abdominal pain.  Reports some subjective fever/chills at home.  Patient additionally reports near syncopal episode earlier while at work where he felt "as if he was going to pass out."  States that symptoms began when he was standing up rolling a large piece of fabric at work.  Noted onset of shortness of breath with generalized weakness and feeling as if he is going to pass out.  Reports some chest pressure at that time.  Symptoms lasted for a matter of seconds before resolving upon being sedentary.  States that he is currently with right sided abdominal pain and mild feelings of shortness of breath.  Denies urinary symptoms.  Denies history of DVT/PE, recent surgery/immobilization, known malignancy, known coagulopathy.  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 Authorizing Provider  acetaminophen (TYLENOL) 325 MG tablet Take 650 mg by mouth every 4 (four) hours as needed for moderate pain or headache.    [provider]  cephALEXin (KEFLEX) 500 MG capsule Take 500 mg by mouth 4 (four) times daily.    [provider]  chlorpheniramine-HYDROcodone (TUSSIONEX) 10-8 MG/5ML SUER Take 5 mLs by mouth at bedtime as needed for cough.  10/14/16   [provider]  dicyclomine (BENTYL) 20 MG tablet Take 1 tablet (20 mg total) by mouth 2 (two) times daily as needed for spasms. 01/09/23  Yes Dion Saucier A, PA  escitalopram (LEXAPRO) 10 MG tablet Take 10 mg by mouth daily.    [provider]  feeding supplement (BOOST / RESOURCE BREEZE) LIQD Take 1 Container by mouth 3 (three) times daily between meals. 10/25/16   Dhungel, Flonnie Overman, MD  ibuprofen (ADVIL) 600 MG tablet Take 1 tablet (600 mg total) by mouth every 6 (six) hours as needed. 01/09/23  Yes Dion Saucier A, PA  lacosamide (VIMPAT) 200 MG TABS tablet Take 100 mg by mouth 2 (two) times daily.    [provider]  ondansetron (ZOFRAN) 4 MG tablet Take 1 tablet (4 mg total) by mouth every 6 (six) hours as needed for nausea or vomiting. 01/09/23  Yes Dion Saucier A, PA  oxyCODONE-acetaminophen (PERCOCET) 5-325 MG tablet Take 2 tablets by mouth every 6 (six) hours as needed. 10/25/16   Dhungel, Nishant, MD  predniSONE (DELTASONE) 20 MG tablet Take 40 mg by mouth daily with breakfast.  [provider]  saccharomyces boulardii (FLORASTOR) 250 MG capsule Take 1 capsule (250 mg total) by mouth 2 (two) times daily. 10/25/16   Dhungel, Flonnie Overman, MD      Allergies    Patient has no known allergies.    Review of Systems   Review of Systems  Cardiovascular:  Positive for near-syncope.  All other systems reviewed and are negative.   Physical Exam Updated Vital Signs BP 124/87   Pulse 85   Temp 98.9 F (37.2 C) (Oral)   Resp 20   Wt 86.2 kg    SpO2 96%   BMI 26.50 kg/m  Physical Exam Vitals and nursing note reviewed.  Constitutional:      General: He is not in acute distress.    Appearance: He is well-developed.  HENT:     Head: Normocephalic and atraumatic.     Nose: Congestion and rhinorrhea present.  Eyes:     Conjunctiva/sclera: Conjunctivae normal.  Cardiovascular:     Rate and Rhythm: Normal rate and regular rhythm.     Heart sounds: No murmur heard. Pulmonary:     Effort: Pulmonary effort is normal. No respiratory distress.     Breath sounds: Normal breath sounds. No wheezing, rhonchi or rales.  Abdominal:     Palpations: Abdomen is soft.     Tenderness: There is abdominal tenderness in the right lower quadrant. There is right CVA tenderness. There is no left CVA tenderness.  Musculoskeletal:        General: No swelling.     Cervical back: Neck supple.     Right lower leg: No edema.     Left lower leg: No edema.  Skin:    General: Skin is warm and dry.     Capillary Refill: Capillary refill takes less than 2 seconds.  Neurological:     Mental Status: He is alert.     Comments: Alert and oriented to self, place, time and event.   Speech is fluent, clear without dysarthria or dysphasia.   Strength 5/5 in upper/lower extremities   Sensation intact in upper/lower extremities   Normal gait.  Negative Romberg. No pronator drift.  Normal finger-to-nose and feet tapping.  CN I not tested  CN II not tested CN III, IV, VI PERRLA and EOMs intact bilaterally  CN V Intact sensation to sharp and light touch to the face  CN VII facial movements symmetric  CN VIII not tested  CN IX, X no uvula deviation, symmetric rise of soft palate  CN XI 5/5 SCM and trapezius strength bilaterally  CN XII Midline tongue protrusion, symmetric L/R movements   Psychiatric:        Mood and Affect: Mood normal.     ED Results / Procedures / Treatments   Labs (all labs ordered are listed, but only abnormal results are  displayed) Labs Reviewed  COMPREHENSIVE METABOLIC PANEL - Abnormal; Notable for the following components:      Result Value   Sodium 133 (*)    CO2 21 (*)    Calcium 8.7 (*)    All other components within normal limits  CBC WITH DIFFERENTIAL/PLATELET - Abnormal; Notable for the following components:   WBC 17.2 (*)    Neutro Abs 10.8 (*)    Monocytes Absolute 2.3 (*)    Abs Immature Granulocytes 0.09 (*)    All other components within normal limits  URINALYSIS, ROUTINE W REFLEX MICROSCOPIC - Abnormal; Notable for the following components:   Glucose, UA >=500 (*)  Hgb urine dipstick SMALL (*)    All other components within normal limits  URINALYSIS, MICROSCOPIC (REFLEX) - Abnormal; Notable for the following components:   Bacteria, UA RARE (*)    All other components within normal limits  SARS CORONAVIRUS 2 BY RT PCR  LIPASE, BLOOD  D-DIMER, QUANTITATIVE  CBG MONITORING, ED  TROPONIN I (HIGH SENSITIVITY)  TROPONIN I (HIGH SENSITIVITY)    EKG EKG Interpretation  Date/Time:  Thursday January 09 2023 10:05:28 EDT Ventricular Rate:  92 PR Interval:  158 QRS Duration: 91 QT Interval:  321 QTC Calculation: 397 R Axis:   86 Text Interpretation: Sinus rhythm J point elevation Confirmed by Elnora Morrison [phone]) on 01/09/2023 1:31:16 PM  Radiology CT Abdomen Pelvis W Contrast  Result Date: 01/09/2023 CLINICAL DATA:  Right lower quadrant abdominal pain. EXAM: CT ABDOMEN AND PELVIS WITH CONTRAST TECHNIQUE: Multidetector CT imaging of the abdomen and pelvis was performed using the standard protocol following bolus administration of intravenous contrast. RADIATION DOSE REDUCTION: This exam was performed according to the departmental dose-optimization program which includes automated exposure control, adjustment of the mA and/or kV according to patient size and/or use of iterative reconstruction technique. CONTRAST:  122mL OMNIPAQUE IOHEXOL 300 MG/ML  SOLN COMPARISON:  CT abdomen pelvis dated  December 05, 2022. FINDINGS: Lower chest: No acute abnormality. Slightly ill-defined area of ground-glass density in the medial left lower lobe likely represents subsegmental atelectasis. Hepatobiliary: No focal liver abnormality is seen. No gallstones, gallbladder wall thickening, or biliary dilatation. Pancreas: Unremarkable. No pancreatic ductal dilatation or surrounding inflammatory changes. Spleen: Normal in size without focal abnormality. Adrenals/Urinary Tract: Adrenal glands are unremarkable. Kidneys are normal, without renal calculi, focal lesion, or hydronephrosis. Bladder is unremarkable. Stomach/Bowel: Stomach is within normal limits. Appendix appears normal, other than chronic prominent submucosal fat. No evidence of bowel wall thickening, distention, or inflammatory changes. Vascular/Lymphatic: No significant vascular findings are present. No enlarged abdominal or pelvic lymph nodes. Reproductive: Uterus and bilateral adnexa are unremarkable. Other: No abdominal wall hernia or abnormality. No abdominopelvic ascites. Musculoskeletal: No acute or significant osseous findings. IMPRESSION: 1. No acute intra-process.  Normal appendix. Electronically Signed   By: Titus Dubin M.D.   On: 01/09/2023 11:59   DG Chest Port 1 View  Result Date: 01/09/2023 CLINICAL DATA:  Chest pain. Weakness with tachycardia and shortness of breath. EXAM: PORTABLE CHEST 1 VIEW COMPARISON:  Radiographs 10/21/2016 and 10/13/2016. FINDINGS: 1031 hours. Suboptimal inspiration. The heart size and mediastinal contours are stable. Mild asymmetric elevation of the left hemidiaphragm is associated with new faint opacity at the left lung base, likely atelectasis. The right lung appears clear. No pleural effusion or pneumothorax. The bones appear unremarkable.  Telemetry leads overlie the chest. IMPRESSION: Suboptimal inspiration with new faint left basilar opacity, likely atelectasis. Electronically Signed   By: Richardean Sale M.D.    On: 01/09/2023 10:53    Procedures Procedures    Medications Ordered in ED Medications  sodium chloride 0.9 % bolus 1,000 mL ( Intravenous Stopped 01/09/23 1048)  morphine (PF) 4 MG/ML injection 4 mg (4 mg Intravenous Given 01/09/23 1041)  ondansetron (ZOFRAN) injection 4 mg (4 mg Intravenous Given 01/09/23 1040)  iohexol (OMNIPAQUE) 300 MG/ML solution 100 mL (100 mLs Intravenous Contrast Given 01/09/23 1134)  fentaNYL (SUBLIMAZE) injection 50 mcg (50 mcg Intravenous Given 01/09/23 1146)  dicyclomine (BENTYL) capsule 10 mg (10 mg Oral Given 01/09/23 1238)  ketorolac (TORADOL) 15 MG/ML injection 15 mg (15 mg Intravenous Given 01/09/23 1238)  ED Course/ Medical Decision Making/ A&P Clinical Course as of 01/09/23 1550  Thu Jan 09, 2023  1018 Monday cough, congestion,  Abdominal pain last night.  diarrhea [CR]    Clinical Course User Index [CR] Wilnette Kales, PA                             Medical Decision Making Amount and/or Complexity of Data Reviewed Labs: ordered. Radiology: ordered.  Risk Prescription drug management.   This patient presents to the ED for concern of near syncope, this involves an extensive number of treatment options, and is a complaint that carries with it a high risk of complications and morbidity.  The differential diagnosis includes gastritis/PUD, pancreatitis, CBD pathology, cholecystitis, hepatitis, SBO/LBO, volvulus, appendicitis, diverticulitis, pyelonephritis, nephrolithiasis, cystitis, mesenteric ischemia, AAA, aortic dissection, dehydration, anemia, PE, CVA, orthostatic hypotension, malignancy, arrhythmia, structural heart disease, vasovagal, orthostatic hypotension   Co morbidities that complicate the patient evaluation  See HPI   Additional history obtained:  Additional history obtained from EMR External records from outside source obtained and reviewed including hospital records   Lab Tests:  I Ordered, and personally interpreted labs.   The pertinent results include: Leukocytosis of 17.2.  No evidence of anemia.  Platelets within normal range.  Electrolyte abnormalities including hyponatremia and decreasing bicarb of 133 and 21 respectively.  No transaminitis.  No renal dysfunction.  UA significant for rare bacteria, small hemoglobin and greater than 500 glucose but otherwise unremarkable.  D-dimer negative.  Lipase within normal limits.  COVID-negative.  CBG within normal limits.  Initial troponin of 2 with repeat 2; EKG concerning for sinus rhythm with J-point elevation otherwise unremarkable.   Imaging Studies ordered:  I ordered imaging studies including chest x-ray, CT abdomen pelvis I independently visualized and interpreted imaging which showed  Chest x-ray: Left lower lung atelectasis. CT abdomen pelvis: No acute intra-abdominal abnormalities. I agree with the radiologist interpretation   Cardiac Monitoring: / EKG:  The patient was maintained on a cardiac monitor.  I personally viewed and interpreted the cardiac monitored which showed an underlying rhythm of: Sinus rhythm with J-point elevation   Consultations Obtained:  I requested consultation with attending physician Dr. Reather Converse who is in agreement with treatment plan going forward  Problem List / ED Course / Critical interventions / Medication management  Near syncope, dehydration I ordered medication including 1 L of normal saline, morphine, fentanyl, Toradol, Bentyl, Zofran   Reevaluation of the patient after these medicines showed that the patient improved I have reviewed the patients home medicines and have made adjustments as needed   Social Determinants of Health:  Former cigarette use.  Denies illicit drug use.   Test / Admission - Considered:  Near syncope, dehydration Vitals signs within normal range and stable throughout visit. Laboratory/imaging studies significant for: See above 32 year old male presents emergency department after near  syncopal episode.  Patient's workup today overall reassuring.  Low suspicion for PE given negative D-dimer, and patient testing negative for PERC criteria.  Doubt ACS given delta negative troponin, lack of significant acute ischemic changes on EKG; patient heart score 0-3.  Regarding patient's abdominal pain, no intra-abdominal pathology appreciable on CT exam of patient's abdomen or pelvis.  Patient is near syncopal episode most likely secondary to dehydration given persistent diarrhea for the last 4 to 5 days and lack of p.o. intake over the same amount of time.  Patient without recent antibiotic use, travel, hiking/camping;  will withhold from empiric antibiotics for treatment of diarrhea at this time in favor of symptomatic therapy at home.  Patient noted significant improvement of symptoms with IV fluid, antiemetic administered while emergency department.  Patient tolerating p.o. upon discharge without difficulty.  Will continue antiemetic at home as well as recommend Imodium as needed for persistent/excessive diarrhea, and Bentyl for abdominal discomfort.  Patient recommended follow-up with primary care within 2 days for reassessment.  Strict return precautions were discussed at length.   Worrisome signs and symptoms were discussed with the patient and the patient acknowledged understanding to return to emergency department if notice.  Patient stable upon discharge from the hospital.        Final Clinical Impression(s) / ED Diagnoses Final diagnoses:  Near syncope  Dehydration    Rx / DC Orders ED Discharge Orders          Ordered    dicyclomine (BENTYL) 20 MG tablet  2 times daily PRN        01/09/23 1333    ibuprofen (ADVIL) 600 MG tablet  Every 6 hours PRN        01/09/23 1333    ondansetron (ZOFRAN) 4 MG tablet  Every 6 hours PRN        01/09/23 1333              Peter Garter, [place] 01/09/23 1550    Blane Ohara, MD 01/11/23 [phone]

## 2023-01-09 NOTE — ED Notes (Signed)
Lab notified of troponin and D Dimer order

## 2023-01-09 NOTE — ED Triage Notes (Signed)
Pt reports that he was feeling unwell and weak and his heart was beating fast and he nearly passed out today work.  Pt reports he has been having abdominal pain.  Pt reports pain in right sided of abdomen and in flanks r>l.  Pt is ambulatory to the room.

## 2023-01-09 NOTE — Discharge Instructions (Addendum)
As discussed, workup today overall reassuring.  Symptoms likely secondary to decreased hydration status given evidence of diarrhea and decreased food/liquid consumption.  Regarding diarrhea, recommend over-the-counter Imodium to take as needed for excessive loose bowel movements.  Recommend adequate oral hydration via electrolyte rich fluids such as Pedialyte, sugar-free Gatorade, liquid IV, body armor.  Follow bland diet for abdominal discomfort as indicated in your discharge instructions.  Regarding abdominal pain, CT imaging negative for any acute intra-abdominal process.  Will prescribe Bentyl to take as needed for abdominal discomfort as well as recommend Tylenol/Motrin in addition.  Recommend follow-up with primary care for reassessment of your symptoms.  Please not hesitate to return to emergency department for worrisome signs and symptoms we discussed become apparent.

## 2023-01-16 ENCOUNTER — Encounter (HOSPITAL_BASED_OUTPATIENT_CLINIC_OR_DEPARTMENT_OTHER): Payer: Self-pay

## 2023-01-16 ENCOUNTER — Other Ambulatory Visit: Payer: Self-pay

## 2023-01-16 ENCOUNTER — Inpatient Hospital Stay (HOSPITAL_BASED_OUTPATIENT_CLINIC_OR_DEPARTMENT_OTHER)
Admission: EM | Admit: 2023-01-16 | Discharge: 2023-01-18 | DRG: 871 | Disposition: A | Discharge status: Home / Self Care | Payer: Medicaid Other | Attending: Internal Medicine | Admitting: Internal Medicine

## 2023-01-16 ENCOUNTER — Emergency Department (HOSPITAL_BASED_OUTPATIENT_CLINIC_OR_DEPARTMENT_OTHER): Payer: Medicaid Other

## 2023-01-16 DIAGNOSIS — R112 Nausea with vomiting, unspecified: Secondary | ICD-10-CM | POA: Diagnosis present

## 2023-01-16 DIAGNOSIS — Z8719 Personal history of other diseases of the digestive system: Secondary | ICD-10-CM

## 2023-01-16 DIAGNOSIS — J189 Pneumonia, unspecified organism: Secondary | ICD-10-CM | POA: Diagnosis not present

## 2023-01-16 DIAGNOSIS — Z87891 Personal history of nicotine dependence: Secondary | ICD-10-CM | POA: Diagnosis not present

## 2023-01-16 DIAGNOSIS — B9710 Unspecified enterovirus as the cause of diseases classified elsewhere: Secondary | ICD-10-CM | POA: Diagnosis present

## 2023-01-16 DIAGNOSIS — A4189 Other specified sepsis: Secondary | ICD-10-CM | POA: Diagnosis not present

## 2023-01-16 DIAGNOSIS — R197 Diarrhea, unspecified: Secondary | ICD-10-CM | POA: Diagnosis not present

## 2023-01-16 DIAGNOSIS — G43909 Migraine, unspecified, not intractable, without status migrainosus: Secondary | ICD-10-CM | POA: Diagnosis not present

## 2023-01-16 DIAGNOSIS — R1032 Left lower quadrant pain: Secondary | ICD-10-CM | POA: Diagnosis present

## 2023-01-16 DIAGNOSIS — Z803 Family history of malignant neoplasm of breast: Secondary | ICD-10-CM

## 2023-01-16 DIAGNOSIS — R109 Unspecified abdominal pain: Secondary | ICD-10-CM | POA: Diagnosis present

## 2023-01-16 DIAGNOSIS — Z86711 Personal history of pulmonary embolism: Secondary | ICD-10-CM

## 2023-01-16 DIAGNOSIS — R7309 Other abnormal glucose: Secondary | ICD-10-CM

## 2023-01-16 DIAGNOSIS — R6511 Systemic inflammatory response syndrome (SIRS) of non-infectious origin with acute organ dysfunction: Secondary | ICD-10-CM

## 2023-01-16 DIAGNOSIS — E872 Acidosis, unspecified: Secondary | ICD-10-CM | POA: Diagnosis not present

## 2023-01-16 DIAGNOSIS — R6520 Severe sepsis without septic shock: Secondary | ICD-10-CM | POA: Diagnosis not present

## 2023-01-16 DIAGNOSIS — A419 Sepsis, unspecified organism: Secondary | ICD-10-CM

## 2023-01-16 DIAGNOSIS — R739 Hyperglycemia, unspecified: Secondary | ICD-10-CM | POA: Diagnosis not present

## 2023-01-16 DIAGNOSIS — E876 Hypokalemia: Secondary | ICD-10-CM | POA: Diagnosis not present

## 2023-01-16 DIAGNOSIS — Z833 Family history of diabetes mellitus: Secondary | ICD-10-CM

## 2023-01-16 DIAGNOSIS — R509 Fever, unspecified: Secondary | ICD-10-CM | POA: Diagnosis present

## 2023-01-16 DIAGNOSIS — B9789 Other viral agents as the cause of diseases classified elsewhere: Secondary | ICD-10-CM | POA: Diagnosis not present

## 2023-01-16 DIAGNOSIS — Z79899 Other long term (current) drug therapy: Secondary | ICD-10-CM

## 2023-01-16 DIAGNOSIS — Z8661 Personal history of infections of the central nervous system: Secondary | ICD-10-CM

## 2023-01-16 DIAGNOSIS — G40909 Epilepsy, unspecified, not intractable, without status epilepticus: Secondary | ICD-10-CM | POA: Diagnosis present

## 2023-01-16 DIAGNOSIS — Z801 Family history of malignant neoplasm of trachea, bronchus and lung: Secondary | ICD-10-CM

## 2023-01-16 DIAGNOSIS — G8929 Other chronic pain: Secondary | ICD-10-CM | POA: Diagnosis present

## 2023-01-16 DIAGNOSIS — R471 Dysarthria and anarthria: Secondary | ICD-10-CM | POA: Diagnosis not present

## 2023-01-16 DIAGNOSIS — E871 Hypo-osmolality and hyponatremia: Secondary | ICD-10-CM | POA: Diagnosis present

## 2023-01-16 DIAGNOSIS — Z1152 Encounter for screening for COVID-19: Secondary | ICD-10-CM | POA: Diagnosis not present

## 2023-01-16 LAB — COMPREHENSIVE METABOLIC PANEL
ALT: 20 U/L (ref 0–44)
AST: 27 U/L (ref 15–41)
Albumin: 4.2 g/dL (ref 3.5–5.0)
Alkaline Phosphatase: 75 U/L (ref 38–126)
Anion gap: 11 (ref 5–15)
BUN: 8 mg/dL (ref 6–20)
CO2: 19 mmol/L — ABNORMAL LOW (ref 22–32)
Calcium: 9 mg/dL (ref 8.9–10.3)
Chloride: 104 mmol/L (ref 98–111)
Creatinine, Ser: 1.13 mg/dL (ref 0.61–1.24)
GFR, Estimated: >60 mL/min (ref >60)
Glucose, Bld: 141 mg/dL — ABNORMAL HIGH (ref 70–99)
Potassium: 3.4 mmol/L — ABNORMAL LOW (ref 3.5–5.1)
Sodium: 134 mmol/L — ABNORMAL LOW (ref 135–145)
Total Bilirubin: 0.7 mg/dL (ref 0.3–1.2)
Total Protein: 7.4 g/dL (ref 6.5–8.1)

## 2023-01-16 LAB — CBC WITH DIFFERENTIAL/PLATELET
Abs Immature Granulocytes: 0.09 10*3/uL — ABNORMAL HIGH (ref 0.00–0.07)
Basophils Absolute: 0.1 10*3/uL (ref 0.0–0.1)
Basophils Relative: 0 %
Eosinophils Absolute: 0.1 10*3/uL (ref 0.0–0.5)
Eosinophils Relative: 1 %
HCT: 41 % (ref 39.0–52.0)
Hemoglobin: 14.5 g/dL (ref 13.0–17.0)
Immature Granulocytes: 1 %
Lymphocytes Relative: 22 %
Lymphs Abs: 3.7 10*3/uL (ref 0.7–4.0)
MCH: 29.6 pg (ref 26.0–34.0)
MCHC: 35.4 g/dL (ref 30.0–36.0)
MCV: 83.7 fL (ref 80.0–100.0)
Monocytes Absolute: 1.6 10*3/uL — ABNORMAL HIGH (ref 0.1–1.0)
Monocytes Relative: 9 %
Neutro Abs: 11.2 10*3/uL — ABNORMAL HIGH (ref 1.7–7.7)
Neutrophils Relative %: 67 %
Platelets: 317 10*3/uL (ref 150–400)
RBC: 4.9 MIL/uL (ref 4.22–5.81)
RDW: 12 % (ref 11.5–15.5)
WBC: 16.8 10*3/uL — ABNORMAL HIGH (ref 4.0–10.5)
nRBC: 0 % (ref 0.0–0.2)

## 2023-01-16 LAB — URINALYSIS, ROUTINE W REFLEX MICROSCOPIC
Bilirubin Urine: NEGATIVE
Glucose, UA: NEGATIVE mg/dL
Ketones, ur: NEGATIVE mg/dL
Leukocytes,Ua: NEGATIVE
Nitrite: NEGATIVE
Protein, ur: NEGATIVE mg/dL
Specific Gravity, Urine: 1.02 (ref 1.005–1.030)
pH: 7 (ref 5.0–8.0)

## 2023-01-16 LAB — URINALYSIS, MICROSCOPIC (REFLEX)

## 2023-01-16 LAB — PROTIME-INR
INR: 1.1 (ref 0.8–1.2)
Prothrombin Time: 13.8 seconds (ref 11.4–15.2)

## 2023-01-16 LAB — RESP PANEL BY RT-PCR (RSV, FLU A&B, COVID)  RVPGX2
Influenza A by PCR: NEGATIVE
Influenza B by PCR: NEGATIVE
Resp Syncytial Virus by PCR: NEGATIVE
SARS Coronavirus 2 by RT PCR: NEGATIVE

## 2023-01-16 LAB — C-REACTIVE PROTEIN: CRP: 4.3 mg/dL — ABNORMAL HIGH (ref <1.0)

## 2023-01-16 LAB — LACTIC ACID, PLASMA
Lactic Acid, Venous: 3.1 mmol/L (ref 0.5–1.9)
Lactic Acid, Venous: 1 mmol/L (ref 0.5–1.9)

## 2023-01-16 LAB — SEDIMENTATION RATE: Sed Rate: 16 mm/hr (ref 0–16)

## 2023-01-16 LAB — FERRITIN: Ferritin: 118 ng/mL (ref 24–336)

## 2023-01-16 LAB — CK: Total CK: 102 U/L (ref 49–397)

## 2023-01-16 LAB — LIPASE, BLOOD: Lipase: 29 U/L (ref 11–51)

## 2023-01-16 MED ORDER — HYDROMORPHONE HCL 1 MG/ML IJ SOLN
1.0000 mg | INTRAMUSCULAR | Status: DC | PRN
  Administered 2023-01-16 – 2023-01-18 (×12): 1 mg via INTRAVENOUS
  Filled 2023-01-16 (×13): qty 1

## 2023-01-16 MED ORDER — MORPHINE SULFATE (PF) 4 MG/ML IV SOLN
4.0000 mg | Freq: Once | INTRAVENOUS | Status: AC
  Administered 2023-01-16: 4 mg via INTRAVENOUS
  Filled 2023-01-16: qty 1

## 2023-01-16 MED ORDER — ONDANSETRON HCL 4 MG/2ML IJ SOLN: 4.0000 mg | Freq: Four times a day (QID) | INTRAMUSCULAR | Status: DC | PRN

## 2023-01-16 MED ORDER — LACTATED RINGERS IV BOLUS
1000.0000 mL | Freq: Once | INTRAVENOUS | Status: AC
  Administered 2023-01-16: 1000 mL via INTRAVENOUS

## 2023-01-16 MED ORDER — ACETAMINOPHEN 325 MG PO TABS
650.0000 mg | ORAL_TABLET | Freq: Once | ORAL | Status: AC | PRN
  Administered 2023-01-16: 650 mg via ORAL
  Filled 2023-01-16: qty 2

## 2023-01-16 MED ORDER — ENOXAPARIN SODIUM 40 MG/0.4ML IJ SOSY
40.0000 mg | PREFILLED_SYRINGE | INTRAMUSCULAR | Status: DC
  Filled 2023-01-16 (×2): qty 0.4

## 2023-01-16 MED ORDER — ONDANSETRON HCL 4 MG/2ML IJ SOLN
4.0000 mg | Freq: Once | INTRAMUSCULAR | Status: AC
  Administered 2023-01-16: 4 mg via INTRAVENOUS
  Filled 2023-01-16: qty 2

## 2023-01-16 MED ORDER — ONDANSETRON HCL 4 MG PO TABS: 4.0000 mg | ORAL_TABLET | Freq: Four times a day (QID) | ORAL | Status: DC | PRN

## 2023-01-16 MED ORDER — OXYCODONE-ACETAMINOPHEN 5-325 MG PO TABS
1.0000 | ORAL_TABLET | ORAL | Status: DC | PRN
  Administered 2023-01-16 – 2023-01-18 (×2): 1 via ORAL
  Filled 2023-01-16 (×2): qty 1

## 2023-01-16 MED ORDER — SODIUM CHLORIDE 0.9 % IV SOLN
1.0000 g | Freq: Every day | INTRAVENOUS | Status: DC
  Administered 2023-01-17 – 2023-01-18 (×2): 1 g via INTRAVENOUS
  Filled 2023-01-16 (×2): qty 10

## 2023-01-16 MED ORDER — ACETAMINOPHEN 325 MG PO TABS
650.0000 mg | ORAL_TABLET | ORAL | Status: DC | PRN
  Administered 2023-01-17 (×2): 650 mg via ORAL
  Filled 2023-01-16 (×2): qty 2

## 2023-01-16 MED ORDER — SENNOSIDES-DOCUSATE SODIUM 8.6-50 MG PO TABS: 1.0000 | ORAL_TABLET | Freq: Every evening | ORAL | Status: DC | PRN

## 2023-01-16 MED ORDER — LACTATED RINGERS IV SOLN: INTRAVENOUS | Status: AC

## 2023-01-16 MED ORDER — HYDROMORPHONE HCL 1 MG/ML IJ SOLN
1.0000 mg | Freq: Once | INTRAMUSCULAR | Status: AC
  Administered 2023-01-16: 1 mg via INTRAVENOUS
  Filled 2023-01-16: qty 1

## 2023-01-16 MED ORDER — SODIUM CHLORIDE 0.9 % IV SOLN
500.0000 mg | Freq: Every day | INTRAVENOUS | Status: DC
  Administered 2023-01-16 – 2023-01-18 (×3): 500 mg via INTRAVENOUS
  Filled 2023-01-16 (×3): qty 5

## 2023-01-16 MED ORDER — SODIUM CHLORIDE 0.9 % IV SOLN
2.0000 g | Freq: Once | INTRAVENOUS | Status: AC
  Administered 2023-01-16: 2 g via INTRAVENOUS
  Filled 2023-01-16: qty 20

## 2023-01-16 MED ORDER — ALBUTEROL SULFATE (2.5 MG/3ML) 0.083% IN NEBU: 2.5000 mg | INHALATION_SOLUTION | RESPIRATORY_TRACT | Status: DC | PRN

## 2023-01-16 MED ORDER — METRONIDAZOLE 500 MG/100ML IV SOLN
500.0000 mg | Freq: Once | INTRAVENOUS | Status: AC
  Administered 2023-01-16: 500 mg via INTRAVENOUS
  Filled 2023-01-16: qty 100

## 2023-01-16 MED ORDER — DICYCLOMINE HCL 20 MG PO TABS: 20.0000 mg | ORAL_TABLET | Freq: Two times a day (BID) | ORAL | Status: DC | PRN

## 2023-01-16 MED ORDER — LACTATED RINGERS IV BOLUS (SEPSIS)
2000.0000 mL | Freq: Once | INTRAVENOUS | Status: AC
  Administered 2023-01-16: 2000 mL via INTRAVENOUS

## 2023-01-16 MED ORDER — IOHEXOL 300 MG/ML  SOLN
100.0000 mL | Freq: Once | INTRAMUSCULAR | Status: AC | PRN
  Administered 2023-01-16: 100 mL via INTRAVENOUS

## 2023-01-16 NOTE — Plan of Care (Signed)
  Problem: Education: Goal: Knowledge of General Education information will improve Description: Including pain rating scale, medication(s)/side effects and non-pharmacologic comfort measures Outcome: Progressing   Problem: Clinical Measurements: Goal: Ability to maintain clinical measurements within normal limits will improve Outcome: Progressing Goal: Will remain free from infection Outcome: Progressing Goal: Diagnostic test results will improve Outcome: Progressing Goal: Respiratory complications will improve Outcome: Progressing Goal: Cardiovascular complication will be avoided Outcome: Progressing   Problem: Activity: Goal: Risk for activity intolerance will decrease Outcome: Progressing   Problem: Nutrition: Goal: Adequate nutrition will be maintained Outcome: Progressing   Problem: Coping: Goal: Level of anxiety will decrease Outcome: Progressing   Problem: Elimination: Goal: Will not experience complications related to bowel motility Outcome: Progressing Goal: Will not experience complications related to urinary retention Outcome: Progressing   Problem: Pain Managment: Goal: General experience of comfort will improve Outcome: Progressing   Problem: Safety: Goal: Ability to remain free from injury will improve Outcome: Progressing   

## 2023-01-16 NOTE — ED Notes (Signed)
Report to floor at [facility]

## 2023-01-16 NOTE — ED Triage Notes (Signed)
Pt reports 12 days of abd pain and a "virus". Pt with hx of autoimmune encephalitis. Pt reports fevers at home. Pt unable to eat very well. Pt also reports N/V/D. Pt reports having a cough and mucous that "tastes like infection." Took ibuprofen last night at 10pm.

## 2023-01-16 NOTE — ED Notes (Signed)
Family in room, pt medicated for pain 2nd lactic drawn and fluids started , pt voided

## 2023-01-16 NOTE — ED Provider Notes (Signed)
Received patient in turnover from Dr. Preston Fleeting.  Please see their note for further details of Hx, PE.  Briefly patient is a 32 y.o. male with a Abdominal Pain and Fever .  32 yo M with viral like syndrome for past week.  Patient found to have a lactic acidosis.  Chest x-ray with possible pneumonia.  Started on community-acquired pneumonia antibiotics.  Patient with fairly recent prolonged steroid use through [facility] with leptomeningeal enhancement encephalitis as the diagnosis.  Plan to admit post CT read.  CT read without obvious acute intra-abdominal pathology.  Discussed with hospitalist for admission.    Melene Plan, DO 01/16/23 [phone]

## 2023-01-16 NOTE — ED Provider Notes (Signed)
[facility] Provider Note   CSN: [id number] Arrival date & time: 01/16/23  0515     History  Chief Complaint  Patient presents with   Abdominal Pain   Fever    Nathan Daugherty is a 32 y.o. male.  The history is provided by the patient.  Abdominal Pain Associated symptoms: fever   Fever He has history of inflammatory bowel disease, autoimmune encephalitis comes in because of fever to 100.8, abdominal pain, nausea, vomiting.  He has been sick for approximately the last 10 days and was seen in the emergency about 1 week ago.  He had a cough as well as abdominal pain and vomiting and diarrhea.  Symptoms were improving, until approximately 24 hours ago when symptoms started coming back and getting worse.  He has had a cough productive of thick yellow sputum.  He has been having vomiting and diarrhea as well as generalized abdominal pain.  He states that his history of inflammatory bowel disease is not Crohn's disease but he cannot give me a specific diagnosis.  He does tell me that he was on steroids for his autoimmune encephalitis, but was taken off of steroids in early March.   Home Medications Prior to Admission medications   Medication Sig Start Date End Date Taking? Authorizing Provider  acetaminophen (TYLENOL) 325 MG tablet Take 650 mg by mouth every 4 (four) hours as needed for moderate pain or headache.    [provider]  cephALEXin (KEFLEX) 500 MG capsule Take 500 mg by mouth 4 (four) times daily.    [provider]  chlorpheniramine-HYDROcodone (TUSSIONEX) 10-8 MG/5ML SUER Take 5 mLs by mouth at bedtime as needed for cough.  10/14/16   [provider]  dicyclomine (BENTYL) 20 MG tablet Take 1 tablet (20 mg total) by mouth 2 (two) times daily as needed for spasms. 01/09/23   Sherian Maroon A, PA  escitalopram (LEXAPRO) 10 MG tablet Take 10 mg by mouth daily.    [provider]  feeding supplement (BOOST /  RESOURCE BREEZE) LIQD Take 1 Container by mouth 3 (three) times daily between meals. 10/25/16   Dhungel, Theda Belfast, MD  ibuprofen (ADVIL) 600 MG tablet Take 1 tablet (600 mg total) by mouth every 6 (six) hours as needed. 01/09/23   Peter Garter, PA  lacosamide (VIMPAT) 200 MG TABS tablet Take 100 mg by mouth 2 (two) times daily.    [provider]  ondansetron (ZOFRAN) 4 MG tablet Take 1 tablet (4 mg total) by mouth every 6 (six) hours as needed for nausea or vomiting. 01/09/23   Peter Garter, PA  oxyCODONE-acetaminophen (PERCOCET) 5-325 MG tablet Take 2 tablets by mouth every 6 (six) hours as needed. 10/25/16   Dhungel, Theda Belfast, MD  predniSONE (DELTASONE) 20 MG tablet Take 40 mg by mouth daily with breakfast.    [provider]  saccharomyces boulardii (FLORASTOR) 250 MG capsule Take 1 capsule (250 mg total) by mouth 2 (two) times daily. 10/25/16   Dhungel, Theda Belfast, MD      Allergies    Patient has no known allergies.    Review of Systems   Review of Systems  Constitutional:  Positive for fever.  Gastrointestinal:  Positive for abdominal pain.  All other systems reviewed and are negative.   Physical Exam Updated Vital Signs BP 129/89 (BP Location: Left Arm)   Pulse (!) 103   Temp (!) 101.4 F (38.6 C)   Resp (!) 27  Ht 5\' 11"  (1.803 m)   Wt 86.2 kg   SpO2 98%   BMI 26.50 kg/m  Physical Exam Vitals and nursing note reviewed.   Somewhat ill-appearing 33 year old male in no acute distress. Vital signs are significant for elevated temperature, heart rate, respiratory rate. Oxygen saturation is 98%, which is normal. Head is normocephalic and atraumatic. PERRLA, EOMI. Oropharynx is clear. Neck is nontender and supple without adenopathy or JVD. Back is nontender in the midline.  There is mild bilateral CVA tenderness. Lungs are clear without rales, wheezes, or rhonchi. Chest is nontender. Heart has regular rate and rhythm without murmur. Abdomen is soft, flat,  with moderate tenderness diffusely.  Maximum tenderness is in the right mid and lower abdomen, but also significant area of tenderness in the left lower abdomen.  There is no rebound or guarding. Extremities have no cyanosis or edema, full range of motion is present. Skin is warm and dry without rash. Neurologic: Mental status is normal, cranial nerves are intact, moves all extremities equally.  ED Results / Procedures / Treatments   Labs (all labs ordered are listed, but only abnormal results are displayed) Labs Reviewed  CULTURE, BLOOD (ROUTINE X 2)  CULTURE, BLOOD (ROUTINE X 2)  RESP PANEL BY RT-PCR (RSV, FLU A&B, COVID)  RVPGX2  LACTIC ACID, PLASMA  LACTIC ACID, PLASMA  COMPREHENSIVE METABOLIC PANEL  CBC WITH DIFFERENTIAL/PLATELET  URINALYSIS, ROUTINE W REFLEX MICROSCOPIC  PROTIME-INR    EKG EKG Interpretation  Date/Time:  Thursday January 16 2023 05:25:06 EDT Ventricular Rate:  108 PR Interval:  145 QRS Duration: 94 QT Interval:  311 QTC Calculation: 417 R Axis:   81 Text Interpretation: Sinus tachycardia ST elev, probable normal early repol pattern When compared with ECG of 01/09/2023, HEART RATE has increased Confirmed by Dione Booze ([id number]) on 01/16/2023 5:32:56 AM  Radiology CT ABDOMEN PELVIS W CONTRAST  Result Date: 01/16/2023 CLINICAL DATA:  Left lower quadrant abdominal pain. EXAM: CT ABDOMEN AND PELVIS WITH CONTRAST TECHNIQUE: Multidetector CT imaging of the abdomen and pelvis was performed using the standard protocol following bolus administration of intravenous contrast. RADIATION DOSE REDUCTION: This exam was performed according to the departmental dose-optimization program which includes automated exposure control, adjustment of the mA and/or kV according to patient size and/or use of iterative reconstruction technique. CONTRAST:  OMNIPAQUE IOHEXOL 300 MG/ML  SOLN COMPARISON:  01/09/2023 FINDINGS: Lower chest:  No contributory findings. Hepatobiliary: No focal  liver abnormality.No evidence of biliary obstruction or stone. Pancreas: Unremarkable. Spleen: Unremarkable. Adrenals/Urinary Tract: Negative adrenals. No hydronephrosis or stone. Unremarkable bladder. Stomach/Bowel: No obstruction. No appendicitis. Submucosal fat in the terminal ileum and appendix, nonspecific but there is history of inflammatory bowel disease. Vascular/Lymphatic: No acute vascular abnormality. No mass or adenopathy. Reproductive:No pathologic findings. Other: No ascites or pneumoperitoneum. Musculoskeletal: No acute abnormalities. IMPRESSION: No acute finding or explanation for symptoms. Electronically Signed   By: Tiburcio Pea M.D.   On: 01/16/2023 07:00   DG Chest Portable 1 View  Result Date: 01/16/2023 CLINICAL DATA:  Abdominal pain, fever and cough. EXAM: PORTABLE CHEST 1 VIEW COMPARISON:  01/09/2023 FINDINGS: The heart size and mediastinal contours are within normal limits. No pleural fluid or airspace disease. There is a new small patchy density identified within the right upper lobe. The visualized skeletal structures are unremarkable. IMPRESSION: New small patchy density identified within the right upper lobe. Cannot rule out early pneumonia. Electronically Signed   By: Signa Kell M.D.   On: 01/16/2023 06:09  Procedures Procedures  Cardiac monitor shows sinus tachycardia, per my interpretation.  Medications Ordered in ED Medications  lactated ringers infusion (has no administration in time range)  lactated ringers bolus 2,000 mL (2,000 mLs Intravenous New Bag/Given 01/16/23 0655)  cefTRIAXone (ROCEPHIN) 2 g in sodium chloride 0.9 % 100 mL IVPB (2 g Intravenous New Bag/Given 01/16/23 0654)  metroNIDAZOLE (FLAGYL) IVPB 500 mg (has no administration in time range)  morphine (PF) 4 MG/ML injection 4 mg (has no administration in time range)  acetaminophen (TYLENOL) tablet 650 mg (650 mg Oral Given 01/16/23 0541)  lactated ringers bolus 1,000 mL (1,000 mLs Intravenous  New Bag/Given 01/16/23 0557)  ondansetron (ZOFRAN) injection 4 mg (4 mg Intravenous Given 01/16/23 0553)  morphine (PF) 4 MG/ML injection 4 mg (4 mg Intravenous Given 01/16/23 0553)  iohexol (OMNIPAQUE) 300 MG/ML solution 100 mL (100 mLs Intravenous Contrast Given 01/16/23 [id number])    ED Course/ Medical Decision Making/ A&P                             Medical Decision Making Amount and/or Complexity of Data Reviewed Labs: ordered. Radiology: ordered.  Risk OTC drugs. Prescription drug management. Decision regarding hospitalization.   Abdominal pain with fever, vomiting, diarrhea as well as respiratory symptoms.  Differential diagnosis is broad and includes viral diseases such as COVID-19 and influenza, pneumonia, Crohn's disease exacerbation, diverticulitis, pancreatitis, cholecystitis, urinary tract infection.  Because of fever, I have started him on the evolving sepsis pathway.  I have ordered acetaminophen for fever.  In addition to standard sepsis labs and x-rays, I have also ordered a CT of abdomen and pelvis.  I have ordered IV fluids, morphine for pain, ondansetron for nausea.  Chest x-ray shows questionable right upper lobe infiltrate.  I have independently viewed the image, and I do not feel that this is an actual infiltrate and do not feel this is the source of his symptoms.  I have reviewed and interpreted his laboratory test, and my interpretation is normal urinalysis, normal lipase, mild hyponatremia and hypokalemia of doubtful clinical significance, moderate leukocytosis with increased immature granulocytes consistent with infection, elevated lactic acid level consistent with sepsis.  I have ordered additional IV fluids to complete early goal-directed fluid treatment.  I have ordered antibiotics for sepsis of GI origin which would also appropriately treat and pneumonia if that were the cause of his symptoms.  I have reviewed his CT scan and do not see any thing obvious, but radiologist  interpretation is pending.  He will need to be admitted, case is signed out to Dr. Adela LankFloyd to review CT results and arrange for hospital admission.  CRITICAL CARE Performed by: Dione Boozeavid Patsy Zaragoza Total critical care time: 60 minutes Critical care time was exclusive of separately billable procedures and treating other patients. Critical care was necessary to treat or prevent imminent or life-threatening deterioration. Critical care was time spent personally by me on the following activities: development of treatment plan with patient and/or surrogate as well as nursing, discussions with consultants, evaluation of patient's response to treatment, examination of patient, obtaining history from patient or surrogate, ordering and performing treatments and interventions, ordering and review of laboratory studies, ordering and review of radiographic studies, pulse oximetry and re-evaluation of patient's condition.  Final Clinical Impression(s) / ED Diagnoses Final diagnoses:  Sepsis due to undetermined organism  Hyponatremia  Hypokalemia  Elevated random blood glucose level    Rx / DC Orders ED Discharge  Orders     None         Dione Booze, MD 01/16/23 [phone]

## 2023-01-16 NOTE — H&P (Signed)
History and Physical  Nathan Daugherty ZOX:[id number] DOB: 11/20/1990 DOA: 01/16/2023  PCP: Tish Frederickson, MD   Chief Complaint: abd pain, chills, body aches   HPI: Nathan Daugherty is a 32 y.o. male with medical history significant for migraine headaches, inflammatory bowel disease, suspected autoimmune encephalitis treated at [facility] in October 2023 who is being admitted to the hospital today with complaints of abdominal pain, fevers, chills, headache and finding of suspected community-acquired pneumonia.  At the time of admission there, he had acute encephalopathy, with confusion, disorientation, and significant dysarthria.  After extensive workup and management, he was improving and discharged home with steroid taper.  Since completing his steroid taper, patient states his those symptoms have not recurred, but he feels like he is getting sick with viral illnesses frequently.  In any case, for the last week or so he has been sick with a viral illness and also having progressively worsening left lower quadrant abdominal pain.  This is associated with subjective fevers and chills, muscle aches, cough productive of thick yellow sputum.  He completed a course of steroids in early March.  ED Course: Patient presented to med [facility] due to worsening abdominal pain in the last 24 hours.  There he was found to be febrile to 101.4, tachycardic to 103, blood pressure 129/89.  Saturating 98% on room air.  Lab work showed white blood cell count of 17,000, lactate 3.1.  CMP relatively unremarkable.  He was given IV fluids, empiric IV antibiotics and CT scan was done which did not show any acute abnormality as noted below.  Chest x-ray was also done which showed possible right upper lobe consolidation.  Hospitalist was contacted for continued hospital care admission especially due to his lactic acidosis.  Patient was accepted for admission, and repeat lactate was requested.  Currently: Patient is resting in  his room with his parents at the bedside.  Intermittently complaining of crampy lower abdominal pain.  Also feels that he is having shaking chills, feels cold.  Denies chest pain, nausea.  Not currently coughing.  Review of Systems: Please see HPI for pertinent positives and negatives. A complete 10 system review of systems are otherwise negative.  Past Medical History:  Diagnosis Date   Disease of brain    inflammatory brain disease autoimmune related   IBD (inflammatory bowel disease)    Seizure-like activity    Past Surgical History:  Procedure Laterality Date   HYPOSPADIAS CORRECTION      Social History:  reports that he has quit smoking. His smoking use included cigarettes. He smoked an average of .5 packs per day. He has never used smokeless tobacco. He reports that he does not currently use alcohol. He reports that he does not use drugs.   No Known Allergies  Family History  Problem Relation Age of Onset   Breast cancer Mother    Diabetes Mellitus II Maternal Grandmother    Breast cancer Maternal Grandmother    Lung cancer Maternal Grandfather      Prior to Admission medications   Medication Sig Start Date End Date Taking? Authorizing Provider  acetaminophen (TYLENOL) 325 MG tablet Take 650 mg by mouth every 4 (four) hours as needed for moderate pain or headache.    [provider]  cephALEXin (KEFLEX) 500 MG capsule Take 500 mg by mouth 4 (four) times daily.    [provider]  chlorpheniramine-HYDROcodone (TUSSIONEX) 10-8 MG/5ML SUER Take 5 mLs by mouth at bedtime as needed for cough.  10/14/16   [provider]  dicyclomine (BENTYL) 20 MG tablet Take 1 tablet (20 mg total) by mouth 2 (two) times daily as needed for spasms. 01/09/23   Sherian Maroon A, PA  escitalopram (LEXAPRO) 10 MG tablet Take 10 mg by mouth daily.    [provider]  feeding supplement (BOOST / RESOURCE BREEZE) LIQD Take 1 Container by mouth 3 (three) times daily  between meals. 10/25/16   Dhungel, Theda Belfast, MD  ibuprofen (ADVIL) 600 MG tablet Take 1 tablet (600 mg total) by mouth every 6 (six) hours as needed. 01/09/23   Peter Garter, PA  lacosamide (VIMPAT) 200 MG TABS tablet Take 100 mg by mouth 2 (two) times daily.    [provider]  ondansetron (ZOFRAN) 4 MG tablet Take 1 tablet (4 mg total) by mouth every 6 (six) hours as needed for nausea or vomiting. 01/09/23   Peter Garter, PA  oxyCODONE-acetaminophen (PERCOCET) 5-325 MG tablet Take 2 tablets by mouth every 6 (six) hours as needed. 10/25/16   Dhungel, Theda Belfast, MD  predniSONE (DELTASONE) 20 MG tablet Take 40 mg by mouth daily with breakfast.    [provider]  saccharomyces boulardii (FLORASTOR) 250 MG capsule Take 1 capsule (250 mg total) by mouth 2 (two) times daily. 10/25/16   Dhungel, Theda Belfast, MD    Physical Exam: BP 126/79 (BP Location: Right Arm)   Pulse 85   Temp 98.8 F (37.1 C) (Oral)   Resp 20   Ht 5\' 11"  (1.803 m)   Wt 86.2 kg   SpO2 97%   BMI 26.50 kg/m   General: Patient is a and oriented, somewhat anxious in appearance, looks a little tired, but not chronically ill.  Well-nourished and well-developed.  Speaking in full sentences with no cough, no evidence of respiratory distress. Eyes: EOMI, clear conjuctivae, white sclerea Neck: supple, no masses, trachea mildline  Cardiovascular: RRR, no murmurs or rubs, no peripheral edema  Respiratory: clear to auscultation bilaterally, no wheezes, no crackles  Abdomen: soft, tender in left lower quadrant, with voluntary guarding, nondistended, normal bowel tones heard  Skin: dry, no rashes  Musculoskeletal: no joint effusions, normal range of motion  Psychiatric: appropriate affect, normal speech  Neurologic: extraocular muscles intact, clear speech, moving all extremities with intact sensorium          Labs on Admission:  Basic Metabolic Panel: Recent Labs  Lab 01/16/23 0543  NA 134*  K 3.4*  CL 104   CO2 19*  GLUCOSE 141*  BUN 8  CREATININE 1.13  CALCIUM 9.0   Liver Function Tests: Recent Labs  Lab 01/16/23 0543  AST 27  ALT 20  ALKPHOS 75  BILITOT 0.7  PROT 7.4  ALBUMIN 4.2   Recent Labs  Lab 01/16/23 0621  LIPASE 29   No results for input(s): "AMMONIA" in the last 168 hours. CBC: Recent Labs  Lab 01/16/23 0543  WBC 16.8*  NEUTROABS 11.2*  HGB 14.5  HCT 41.0  MCV 83.7  PLT 317   Cardiac Enzymes: No results for input(s): "CKTOTAL", "CKMB", "CKMBINDEX", "TROPONINI" in the last 168 hours.  BNP (last 3 results) No results for input(s): "BNP" in the last 8760 hours.  ProBNP (last 3 results) No results for input(s): "PROBNP" in the last 8760 hours.  CBG: No results for input(s): "GLUCAP" in the last 168 hours.  Radiological Exams on Admission: CT ABDOMEN PELVIS W CONTRAST  Result Date: 01/16/2023 CLINICAL DATA:  Left lower quadrant abdominal pain. EXAM: CT  ABDOMEN AND PELVIS WITH CONTRAST TECHNIQUE: Multidetector CT imaging of the abdomen and pelvis was performed using the standard protocol following bolus administration of intravenous contrast. RADIATION DOSE REDUCTION: This exam was performed according to the departmental dose-optimization program which includes automated exposure control, adjustment of the mA and/or kV according to patient size and/or use of iterative reconstruction technique. CONTRAST:  100mL OMNIPAQUE IOHEXOL 300 MG/ML  SOLN COMPARISON:  01/09/2023 FINDINGS: Lower chest:  No contributory findings. Hepatobiliary: No focal liver abnormality.No evidence of biliary obstruction or stone. Pancreas: Unremarkable. Spleen: Unremarkable. Adrenals/Urinary Tract: Negative adrenals. No hydronephrosis or stone. Unremarkable bladder. Stomach/Bowel: No obstruction. No appendicitis. Submucosal fat in the terminal ileum and appendix, nonspecific but there is history of inflammatory bowel disease. Vascular/Lymphatic: No acute vascular abnormality. No mass or  adenopathy. Reproductive:No pathologic findings. Other: No ascites or pneumoperitoneum. Musculoskeletal: No acute abnormalities. IMPRESSION: No acute finding or explanation for symptoms. Electronically Signed   By: Tiburcio PeaJonathan  Watts M.D.   On: 01/16/2023 07:00   DG Chest Portable 1 View  Result Date: 01/16/2023 CLINICAL DATA:  Abdominal pain, fever and cough. EXAM: PORTABLE CHEST 1 VIEW COMPARISON:  01/09/2023 FINDINGS: The heart size and mediastinal contours are within normal limits. No pleural fluid or airspace disease. There is a new small patchy density identified within the right upper lobe. The visualized skeletal structures are unremarkable. IMPRESSION: New small patchy density identified within the right upper lobe. Cannot rule out early pneumonia. Electronically Signed   By: Signa Kellaylor  Stroud M.D.   On: 01/16/2023 06:09    Assessment/Plan Principal Problem: Severe sepsis-meeting criteria at the time of admission with tachycardia, fever, leukocytosis, lactate 3.  He is hemodynamically stable, fluid responsive and lactic acidosis has resolved.  He continues to have abdominal pain, chills/rigors, body aches, etc.  I feel it unlikely that all of this is being caused by his possible suspected community-acquired pneumonia, but rather may be connected to an as yet undiagnosed connective tissue disease.  I was able to review the case with with his [facility] neurologist Dr. Quillian QuinceElijah Lackey who feels that the current presentation is not directly related to his previous diagnosis of autoimmune encephalitis.  Recommends no specific therapy or imaging at this time, but states that if concern develops for encephalitis, next step would be LP. -Observation admission -Continue empiric IV azithromycin and Rocephin for suspected community-acquired pneumonia -Supportive care with pain and nausea control as needed -Will check ANCA profile, ANA, aldolase, antiscleroderma antibody, ESR, CRP, C3, C4, CCP, ferritin as I do suspect  a vasculitis or other autoimmune condition which could account for his fevers, abdominal pain, etc.  These symptoms may have been masked by his long steroid taper that ended in March.  He would likely benefit from outpatient rheumatology consultation as well.  Active Problems:   Hypokalemia   CAP (community acquired pneumonia)-for now continue empiric IV azithromycin and Rocephin   Abdominal pain   Lactic acidosis-as above, resolved   Migraines-as needed home medication has been ordered   DVT prophylaxis: Lovenox     Code Status: Full Code  Consults called: None  Admission status: Observation.  Plan of care and rationale was discussed in detail with the patient and his parents at the bedside at the time of admission, and they were also witnessed my conversation with Dr. [facility] SalviaLackey his neurologist.  All questions were answered to their satisfaction.  Time spent: 50 minutes  Jaspreet Bodner Sharlette DenseM Carmita Boom MD [facility] Pager [phone]  If 7PM-7AM, please contact night-coverage [email or web address] Password  [email or web address]  01/16/2023, 2:27 PM

## 2023-01-16 NOTE — ED Notes (Addendum)
Patient transported to CT 

## 2023-01-16 NOTE — Sepsis Progress Note (Signed)
Elink monitoring for the code sepsis protocol.  

## 2023-01-17 DIAGNOSIS — R6520 Severe sepsis without septic shock: Secondary | ICD-10-CM | POA: Diagnosis present

## 2023-01-17 DIAGNOSIS — R509 Fever, unspecified: Secondary | ICD-10-CM | POA: Diagnosis present

## 2023-01-17 DIAGNOSIS — Z803 Family history of malignant neoplasm of breast: Secondary | ICD-10-CM | POA: Diagnosis not present

## 2023-01-17 DIAGNOSIS — Z8661 Personal history of infections of the central nervous system: Secondary | ICD-10-CM | POA: Diagnosis not present

## 2023-01-17 DIAGNOSIS — R1032 Left lower quadrant pain: Secondary | ICD-10-CM | POA: Diagnosis present

## 2023-01-17 DIAGNOSIS — Z801 Family history of malignant neoplasm of trachea, bronchus and lung: Secondary | ICD-10-CM | POA: Diagnosis not present

## 2023-01-17 DIAGNOSIS — Z833 Family history of diabetes mellitus: Secondary | ICD-10-CM | POA: Diagnosis not present

## 2023-01-17 DIAGNOSIS — J189 Pneumonia, unspecified organism: Secondary | ICD-10-CM | POA: Diagnosis present

## 2023-01-17 DIAGNOSIS — B341 Enterovirus infection, unspecified: Secondary | ICD-10-CM | POA: Diagnosis not present

## 2023-01-17 DIAGNOSIS — Z79899 Other long term (current) drug therapy: Secondary | ICD-10-CM | POA: Diagnosis not present

## 2023-01-17 DIAGNOSIS — E876 Hypokalemia: Secondary | ICD-10-CM | POA: Diagnosis present

## 2023-01-17 DIAGNOSIS — B9789 Other viral agents as the cause of diseases classified elsewhere: Secondary | ICD-10-CM | POA: Diagnosis present

## 2023-01-17 DIAGNOSIS — Z86711 Personal history of pulmonary embolism: Secondary | ICD-10-CM | POA: Diagnosis not present

## 2023-01-17 DIAGNOSIS — Z8719 Personal history of other diseases of the digestive system: Secondary | ICD-10-CM | POA: Diagnosis not present

## 2023-01-17 DIAGNOSIS — E872 Acidosis, unspecified: Secondary | ICD-10-CM | POA: Diagnosis present

## 2023-01-17 DIAGNOSIS — R197 Diarrhea, unspecified: Secondary | ICD-10-CM | POA: Diagnosis present

## 2023-01-17 DIAGNOSIS — Z87891 Personal history of nicotine dependence: Secondary | ICD-10-CM | POA: Diagnosis not present

## 2023-01-17 DIAGNOSIS — G40909 Epilepsy, unspecified, not intractable, without status epilepticus: Secondary | ICD-10-CM | POA: Diagnosis present

## 2023-01-17 DIAGNOSIS — R739 Hyperglycemia, unspecified: Secondary | ICD-10-CM | POA: Diagnosis present

## 2023-01-17 DIAGNOSIS — Z1152 Encounter for screening for COVID-19: Secondary | ICD-10-CM | POA: Diagnosis not present

## 2023-01-17 DIAGNOSIS — R471 Dysarthria and anarthria: Secondary | ICD-10-CM | POA: Diagnosis present

## 2023-01-17 DIAGNOSIS — R112 Nausea with vomiting, unspecified: Secondary | ICD-10-CM | POA: Diagnosis present

## 2023-01-17 DIAGNOSIS — B9710 Unspecified enterovirus as the cause of diseases classified elsewhere: Secondary | ICD-10-CM | POA: Diagnosis present

## 2023-01-17 DIAGNOSIS — G43909 Migraine, unspecified, not intractable, without status migrainosus: Secondary | ICD-10-CM | POA: Diagnosis present

## 2023-01-17 DIAGNOSIS — A4189 Other specified sepsis: Secondary | ICD-10-CM | POA: Diagnosis present

## 2023-01-17 DIAGNOSIS — E871 Hypo-osmolality and hyponatremia: Secondary | ICD-10-CM | POA: Diagnosis present

## 2023-01-17 DIAGNOSIS — B348 Other viral infections of unspecified site: Secondary | ICD-10-CM | POA: Diagnosis not present

## 2023-01-17 LAB — CBC
HCT: 39.9 % (ref 39.0–52.0)
Hemoglobin: 13.5 g/dL (ref 13.0–17.0)
MCH: 29.4 pg (ref 26.0–34.0)
MCHC: 33.8 g/dL (ref 30.0–36.0)
MCV: 86.9 fL (ref 80.0–100.0)
Platelets: 264 10*3/uL (ref 150–400)
RBC: 4.59 MIL/uL (ref 4.22–5.81)
RDW: 12.3 % (ref 11.5–15.5)
WBC: 16.3 10*3/uL — ABNORMAL HIGH (ref 4.0–10.5)
nRBC: 0 % (ref 0.0–0.2)

## 2023-01-17 LAB — RESPIRATORY PANEL BY PCR

## 2023-01-17 LAB — EXPECTORATED SPUTUM ASSESSMENT W GRAM STAIN, RFLX TO RESP C

## 2023-01-17 LAB — TROPONIN I (HIGH SENSITIVITY)
Troponin I (High Sensitivity): 3 ng/L (ref <18)
Troponin I (High Sensitivity): 3 ng/L (ref <18)

## 2023-01-17 LAB — ANCA PROFILE
Anti-MPO Antibodies: <0.2 units (ref 0.0–0.9)
Anti-PR3 Antibodies: <0.2 units (ref 0.0–0.9)
Atypical P-ANCA titer: <1:20 {titer}
C-ANCA: <1:20 {titer}
P-ANCA: <1:20 {titer}

## 2023-01-17 LAB — BASIC METABOLIC PANEL
Anion gap: 8 (ref 5–15)
BUN: 7 mg/dL (ref 6–20)
CO2: 23 mmol/L (ref 22–32)
Calcium: 8.6 mg/dL — ABNORMAL LOW (ref 8.9–10.3)
Chloride: 103 mmol/L (ref 98–111)
Creatinine, Ser: 0.91 mg/dL (ref 0.61–1.24)
GFR, Estimated: >60 mL/min (ref >60)
Glucose, Bld: 94 mg/dL (ref 70–99)
Potassium: 3.5 mmol/L (ref 3.5–5.1)
Sodium: 134 mmol/L — ABNORMAL LOW (ref 135–145)

## 2023-01-17 LAB — CULTURE, BLOOD (ROUTINE X 2)
Special Requests: ADEQUATE
Special Requests: ADEQUATE

## 2023-01-17 LAB — CULTURE, RESPIRATORY W GRAM STAIN

## 2023-01-17 LAB — HIV ANTIBODY (ROUTINE TESTING W REFLEX): HIV Screen 4th Generation wRfx: NONREACTIVE

## 2023-01-17 LAB — ALDOLASE: Aldolase: 4.5 U/L (ref 3.3–10.3)

## 2023-01-17 LAB — ANTINUCLEAR ANTIBODIES, IFA: ANA Ab, IFA: NEGATIVE

## 2023-01-17 LAB — CYCLIC CITRUL PEPTIDE ANTIBODY, IGG/IGA: CCP Antibodies IgG/IgA: 6 units (ref 0–19)

## 2023-01-17 LAB — ANTI-SCLERODERMA ANTIBODY: Scleroderma (Scl-70) (ENA) Antibody, IgG: <0.2 AI (ref 0.0–0.9)

## 2023-01-17 MED ORDER — LACOSAMIDE 50 MG PO TABS
100.0000 mg | ORAL_TABLET | Freq: Two times a day (BID) | ORAL | Status: DC
  Administered 2023-01-17 – 2023-01-18 (×3): 100 mg via ORAL
  Filled 2023-01-17 (×3): qty 2

## 2023-01-17 MED ORDER — GUAIFENESIN ER 600 MG PO TB12: 600.0000 mg | ORAL_TABLET | Freq: Two times a day (BID) | ORAL | Status: DC | PRN

## 2023-01-17 NOTE — TOC CM/SW Note (Signed)
  Transition of Care [facility]) Screening Note  Patient Details  Name: Nathan Daugherty Date of Birth: 1991/05/07  Transition of Care [facility]) CM/SW Contact:    Ewing Schlein, LCSW Phone Number: 01/17/2023, 9:54 AM  Transition of Care Department [facility]) has reviewed patient and no TOC needs have been identified at this time. We will continue to monitor patient advancement through interdisciplinary progression rounds. If new patient transition needs arise, please place a TOC consult.

## 2023-01-17 NOTE — Progress Notes (Signed)
Patient is concerned that his chest pain is getting worst.  Pain increase with coughing and chest pain is feels like something is squeezing. [place] Eric, DO paged at [phone]

## 2023-01-17 NOTE — Plan of Care (Signed)
Received patient awake and alert in bed with family at bedside. C/O generalized pain relieved by PRN Diluadid. Currently afebrile. Remains on room air. Up ambulating in room. Safety precautions maintained.  Problem: Education: Goal: Knowledge of General Education information will improve Description: Including pain rating scale, medication(s)/side effects and non-pharmacologic comfort measures Outcome: Progressing   Problem: Health Behavior/Discharge Planning: Goal: Ability to manage health-related needs will improve Outcome: Progressing   Problem: Clinical Measurements: Goal: Ability to maintain clinical measurements within normal limits will improve Outcome: Progressing Goal: Will remain free from infection Outcome: Progressing Goal: Diagnostic test results will improve Outcome: Progressing Goal: Respiratory complications will improve Outcome: Progressing Goal: Cardiovascular complication will be avoided Outcome: Progressing   Problem: Activity: Goal: Risk for activity intolerance will decrease Outcome: Progressing   Problem: Nutrition: Goal: Adequate nutrition will be maintained Outcome: Progressing   Problem: Coping: Goal: Level of anxiety will decrease Outcome: Progressing   Problem: Elimination: Goal: Will not experience complications related to bowel motility Outcome: Progressing Goal: Will not experience complications related to urinary retention Outcome: Progressing   Problem: Pain Managment: Goal: General experience of comfort will improve Outcome: Progressing   Problem: Safety: Goal: Ability to remain free from injury will improve Outcome: Progressing   Problem: Skin Integrity: Goal: Risk for impaired skin integrity will decrease Outcome: Progressing   Problem: Activity: Goal: Ability to tolerate increased activity will improve Outcome: Progressing   Problem: Clinical Measurements: Goal: Ability to maintain a body temperature in the normal range  will improve Outcome: Progressing   Problem: Respiratory: Goal: Ability to maintain adequate ventilation will improve Outcome: Progressing Goal: Ability to maintain a clear airway will improve Outcome: Progressing

## 2023-01-17 NOTE — Progress Notes (Signed)
PROGRESS NOTE    Nathan Daugherty  ZOX:[id number] DOB: December 05, 1990 DOA: 01/16/2023 PCP: Tish Frederickson, MD    Brief Narrative:   Nathan Daugherty is a 32 y.o. 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  Reports over the last 1 week felt like he has been suffering from my viral illness with progressive left lower quadrant abdominal pain with subjective fever/chills, muscle aches, productive cough of thick yellow sputum.  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  Recently completed a course of steroids early March 2024.  In the ED, temperature 101.4 F, HR 103, RR 27, BP 129/89, SpO2 98% on room air.  WBC 16.8, hemoglobin 14.5, platelets 317.  Sodium 134, potassium 3.4, chloride 104, CO2 19, glucose 141, BUN 8, creatinine 1.13.  AST 27, ALT 20, total bilirubin 0.7.  Lipase 29.  Lactic acid 3.1.  INR 1.1.  COVID/RSV/influenza PCR negative.  Chest x-ray with small patchy density right upper lobe.  CT abdomen/pelvis with no acute finding.  Blood cultures x 2 obtained.  TRH consulted for admission for further evaluation management of commune acquired pneumonia, fever, lactic acidosis.  Patient was transferred to [facility] for further care.  Assessment & Plan:   Severe sepsis, POA Community-acquired pneumonia Lactic stenosis Presenting to ED with fever, chills, abdominal pain, body aches over the last week.  On admission patient was on to be tachycardic, tachypneic with elevated WBC count/lactic acid and fever 101.4 F.  Chest x-ray notable for right upper lobe infiltrate.  CT abdomen/pelvis negative. -- WBC 16.7>16.7 -- Lactic acid 3.1>1.0 -- Sputum  culture: Pending -- Respiratory viral panel: Pending -- Blood cultures x 2: Pending -- Azithromycin 500 mg IV every 24 hours -- Ceftriaxone 1 g IV every 24 hours -- Mucinex 600 mg p.o. twice daily  Hx seizure disorder Hx autoimmune encephalitis/leptomeningeal enhancement Follows with [facility] neurology, Dr. Duke Salvia.  Case was discussed with Dr. Duke Salvia by admitting physician who believes that his current presentation not directly related to his previous diagnosis and recommends no specific therapy or imaging at this time.  But if concern for encephalitis develops neck step would be LP.  Given his fever and nonlocalizing symptoms may be also related to a vasculitis or other autoimmune condition. -- ANCA, ANA, aldolase, antiscleroderma antibody, C3, C4, CCP: Pending -- Outpatient follow-up with [facility] neurology, Dr. Duke Salvia -- Would benefit from outpatient rheumatology evaluation as well    DVT prophylaxis: enoxaparin (LOVENOX) injection 40 mg Start: 01/16/23 2200 SCDs Start: 01/16/23 1302    Code Status: Full Code Family Communication: Updated mother present at bedside this morning  Disposition Plan:  Level of care: Med-Surg Status is: Inpatient Remains inpatient appropriate because: IV antibiotics, anticipate discharge home in 1-2 days once afebrile for 24 hours before transitioning from IV to oral antibiotics    Consultants:  Admitting physician discussed with patient's primary neurologist, Dr. Duke Salvia at [facility] on 4/11  Procedures:  None  Antimicrobials:  Azithromycin 4/11>> Ceftriaxone 4/11>> Metronidazole 4/11 - 4/11   Subjective: Patient seen examined bedside, lying in bed.  Mother present.  Ordering breakfast.  Continues with chills/sweats; fever this morning but improved.  Also continues with productive cough of yellow sputum.  WBC count slightly improved today and fever curve also improving.  Discussed with  patient/mother at bedside regarding  further autoimmune workup and treatment for pneumonia.  No other specific questions or concerns at this time.  Denies headache, no dizziness, no visual changes, no chest pain, no palpitations, no shortness of breath, no current abdominal pain, no nausea/vomiting/diarrhea, no focal weakness, no congestion, no paresthesias.  No acute events overnight per nursing staff.  Objective: Vitals:   01/17/23 0023 01/17/23 0600 01/17/23 0955 01/17/23 1219  BP: 119/70 128/74  (!) 105/58  Pulse: 63 86  82  Resp: Temp: 98.6 F (37 C) (!) 100.7 F (38.2 C) 98.5 F (36.9 C) 98.9 F (37.2 C)  TempSrc: Oral Oral Oral Oral  SpO2: 97% 93%  98%  Weight:      Height:        Intake/Output Summary (Last 24 hours) at 01/17/2023 1229 Last data filed at 01/17/2023 0429 Gross per 24 hour  Intake 3013.76 ml  Output 575 ml  Net 2438.76 ml   Filed Weights   01/16/23 0525  Weight: 86.2 kg    Examination:  Physical Exam: GEN: NAD, alert and oriented x 3, wd/wn HEENT: NCAT, PERRL, EOMI, sclera clear, MMM PULM: CTAB w/o wheezes/crackles, normal respiratory effort, on room air CV: RRR w/o M/G/R GI: abd soft, NTND, NABS, no R/G/M MSK: no peripheral edema, muscle strength globally intact 5/5 bilateral upper/lower extremities NEURO: CN II-XII intact, no focal deficits, sensation to light touch intact PSYCH: normal mood/affect Integumentary: dry/intact, no rashes or wounds    Data Reviewed: I have personally reviewed following labs and imaging studies  CBC: Recent Labs  Lab 01/16/23 0543 01/17/23 0708  WBC 16.8* 16.3*  NEUTROABS 11.2*  --   HGB 14.5 13.5  HCT 41.0 39.9  MCV 83.7 86.9  PLT 317 264   Basic Metabolic Panel: Recent Labs  Lab 01/16/23 0543 01/17/23 0708  NA 134* 134*  K 3.4* 3.5  CL 104 103  CO2 19* 23  GLUCOSE 141* 94  BUN 8 7  CREATININE 1.13 0.91  CALCIUM 9.0 8.6*   GFR: Estimated Creatinine Clearance: 125.3 mL/min (by C-G formula based on SCr of 0.91  mg/dL). Liver Function Tests: Recent Labs  Lab 01/16/23 0543  AST 27  ALT 20  ALKPHOS 75  BILITOT 0.7  PROT 7.4  ALBUMIN 4.2   Recent Labs  Lab 01/16/23 0621  LIPASE 29   No results for input(s): "AMMONIA" in the last 168 hours. Coagulation Profile: Recent Labs  Lab 01/16/23 0543  INR 1.1   Cardiac Enzymes: Recent Labs  Lab 01/16/23 1444  CKTOTAL 102   BNP (last 3 results) No results for input(s): "PROBNP" in the last 8760 hours. HbA1C: No results for input(s): "HGBA1C" in the last 72 hours. CBG: No results for input(s): "GLUCAP" in the last 168 hours. Lipid Profile: No results for input(s): "CHOL", "HDL", "LDLCALC", "TRIG", "CHOLHDL", "LDLDIRECT" in the last 72 hours. Thyroid Function Tests: No results for input(s): "TSH", "T4TOTAL", "FREET4", "T3FREE", "THYROIDAB" in the last 72 hours. Anemia Panel: Recent Labs    01/16/23 1444  FERRITIN 118   Sepsis Labs: Recent Labs  Lab 01/16/23 0544 01/16/23 0753  LATICACIDVEN 3.1* 1.0    Recent Results (from the past 240 hour(s))  SARS Coronavirus 2 by RT PCR (hospital order, performed in [facility] hospital lab) *cepheid single result test* Anterior Nasal Swab     Status: None   Collection Time: 01/09/23 10:50 AM   Specimen: Anterior Nasal Swab  Result Value Ref Range Status  SARS Coronavirus 2 by RT PCR NEGATIVE NEGATIVE Final    Comment: (NOTE) SARS-CoV-2 target nucleic acids are NOT DETECTED.  The SARS-CoV-2 RNA is generally detectable in upper and lower respiratory specimens during the acute phase of infection. The lowest concentration of SARS-CoV-2 viral copies this assay can detect is 250 copies / mL. A negative result does not preclude SARS-CoV-2 infection and should not be used as the sole basis for treatment or other patient management decisions.  A negative result may occur with improper specimen collection / handling, submission of specimen other than nasopharyngeal swab, presence of viral  mutation(s) within the areas targeted by this assay, and inadequate number of viral copies (<250 copies / mL). A negative result must be combined with clinical observations, patient history, and epidemiological information.  Fact Sheet for Patients:   RoadLapTop.co.za  Fact Sheet for Healthcare Providers: http://kim-miller.com/  This test is not yet approved or  cleared by the [place] FDA and has been authorized for detection and/or diagnosis of SARS-CoV-2 by FDA under an Emergency Use Authorization (EUA).  This EUA will remain in effect (meaning this test can be used) for the duration of the COVID-19 declaration under Section 564(b)(1) of the Act, 21 U.S.C. section 360bbb-3(b)(1), unless the authorization is terminated or revoked sooner.  Performed at [facility], [address] [place], [place] [address]   Culture, blood (Routine x 2)     Status: None (Preliminary result)   Collection Time: 01/16/23  5:35 AM   Specimen: BLOOD  Result Value Ref Range Status   Specimen Description   Final    BLOOD RIGHT ANTECUBITAL Performed at [facility], [address] [place], [place] [address]    Special Requests   Final    BOTTLES DRAWN AEROBIC AND ANAEROBIC Blood Culture adequate volume Performed at [facility], [address] [place], [place] [address]    Culture   Final    NO GROWTH < 24 HOURS Performed at [facility], [address], [place], [place] [address]    Report Status PENDING  Incomplete  Resp panel by RT-PCR (RSV, Flu A&B, Covid) Anterior Nasal Swab     Status: None   Collection Time: 01/16/23  5:44 AM   Specimen: Anterior Nasal Swab  Result Value Ref Range Status   SARS Coronavirus 2 by RT PCR NEGATIVE NEGATIVE Final    Comment: (NOTE) SARS-CoV-2 target nucleic acids are NOT DETECTED.  The SARS-CoV-2 RNA is generally detectable in upper respiratory specimens during the acute  phase of infection. The lowest concentration of SARS-CoV-2 viral copies this assay can detect is 138 copies/mL. A negative result does not preclude SARS-Cov-2 infection and should not be used as the sole basis for treatment or other patient management decisions. A negative result may occur with  improper specimen collection/handling, submission of specimen other than nasopharyngeal swab, presence of viral mutation(s) within the areas targeted by this assay, and inadequate number of viral copies(<138 copies/mL). A negative result must be combined with clinical observations, patient history, and epidemiological information. The expected result is Negative.  Fact Sheet for Patients:  BloggerCourse.com  Fact Sheet for Healthcare Providers:  SeriousBroker.it  This test is no t yet approved or cleared by the [place] FDA and  has been authorized for detection and/or diagnosis of SARS-CoV-2 by FDA under an Emergency Use Authorization (EUA). This EUA will remain  in effect (meaning this test can be used) for the  duration of the COVID-19 declaration under Section 564(b)(1) of the Act, 21 U.S.C.section 360bbb-3(b)(1), unless the authorization is terminated  or revoked sooner.       Influenza A by PCR NEGATIVE NEGATIVE Final   Influenza B by PCR NEGATIVE NEGATIVE Final    Comment: (NOTE) The Xpert Xpress SARS-CoV-2/FLU/RSV plus assay is intended as an aid in the diagnosis of influenza from Nasopharyngeal swab specimens and should not be used as a sole basis for treatment. Nasal washings and aspirates are unacceptable for Xpert Xpress SARS-CoV-2/FLU/RSV testing.  Fact Sheet for Patients: BloggerCourse.com  Fact Sheet for Healthcare Providers: SeriousBroker.it  This test is not yet approved or cleared by the [place] FDA and has been authorized for detection and/or diagnosis of  SARS-CoV-2 by FDA under an Emergency Use Authorization (EUA). This EUA will remain in effect (meaning this test can be used) for the duration of the COVID-19 declaration under Section 564(b)(1) of the Act, 21 U.S.C. section 360bbb-3(b)(1), unless the authorization is terminated or revoked.     Resp Syncytial Virus by PCR NEGATIVE NEGATIVE Final    Comment: (NOTE) Fact Sheet for Patients: BloggerCourse.com  Fact Sheet for Healthcare Providers: SeriousBroker.it  This test is not yet approved or cleared by the [place] FDA and has been authorized for detection and/or diagnosis of SARS-CoV-2 by FDA under an Emergency Use Authorization (EUA). This EUA will remain in effect (meaning this test can be used) for the duration of the COVID-19 declaration under Section 564(b)(1) of the Act, 21 U.S.C. section 360bbb-3(b)(1), unless the authorization is terminated or revoked.  Performed at [facility], [address] [place], [place] [address]   Culture, blood (Routine x 2)     Status: None (Preliminary result)   Collection Time: 01/16/23  6:10 AM   Specimen: BLOOD  Result Value Ref Range Status   Specimen Description   Final    BLOOD LEFT ANTECUBITAL Performed at [facility], [address], [place], [place] [address]    Special Requests   Final    BOTTLES DRAWN AEROBIC AND ANAEROBIC Blood Culture adequate volume Performed at [facility], [address] [place], [place] [address]    Culture   Final    NO GROWTH < 24 HOURS Performed at [facility], [address] [place], [place] [address]    Report Status PENDING  Incomplete  Expectorated Sputum Assessment w Gram Stain, Rflx to Resp Cult     Status: None   Collection Time: 01/17/23  9:19 AM   Specimen: Nasopharyngeal Swab; Sputum  Result Value Ref Range Status   Specimen Description EXPECTORATED SPUTUM  Final   Special Requests NONE   Final   Sputum evaluation   Final    THIS SPECIMEN IS ACCEPTABLE FOR SPUTUM CULTURE Performed at [facility], [address], [place], [place] [address]    Report Status 01/17/2023 FINAL  Final         Radiology Studies: CT ABDOMEN PELVIS W CONTRAST  Result Date: 01/16/2023 CLINICAL DATA:  Left lower quadrant abdominal pain. EXAM: CT ABDOMEN AND PELVIS WITH CONTRAST TECHNIQUE: Multidetector CT imaging of the abdomen and pelvis was performed using the standard protocol following bolus administration of intravenous contrast. RADIATION DOSE REDUCTION: This exam was performed according to the departmental dose-optimization program which includes automated exposure control, adjustment of the mA and/or kV according to patient size and/or use of iterative reconstruction technique. CONTRAST:  OMNIPAQUE  IOHEXOL 300 MG/ML  SOLN COMPARISON:  01/09/2023 FINDINGS: Lower chest:  No contributory findings. Hepatobiliary: No focal liver abnormality.No evidence of biliary obstruction or stone. Pancreas: Unremarkable. Spleen: Unremarkable. Adrenals/Urinary Tract: Negative adrenals. No hydronephrosis or stone. Unremarkable bladder. Stomach/Bowel: No obstruction. No appendicitis. Submucosal fat in the terminal ileum and appendix, nonspecific but there is history of inflammatory bowel disease. Vascular/Lymphatic: No acute vascular abnormality. No mass or adenopathy. Reproductive:No pathologic findings. Other: No ascites or pneumoperitoneum. Musculoskeletal: No acute abnormalities. IMPRESSION: No acute finding or explanation for symptoms. Electronically Signed   By: Tiburcio Pea M.D.   On: 01/16/2023 07:00   DG Chest Portable 1 View  Result Date: 01/16/2023 CLINICAL DATA:  Abdominal pain, fever and cough. EXAM: PORTABLE CHEST 1 VIEW COMPARISON:  01/09/2023 FINDINGS: The heart size and mediastinal contours are within normal limits. No pleural fluid or airspace disease. There is a new small  patchy density identified within the right upper lobe. The visualized skeletal structures are unremarkable. IMPRESSION: New small patchy density identified within the right upper lobe. Cannot rule out early pneumonia. Electronically Signed   By: Signa Kell M.D.   On: 01/16/2023 06:09        Scheduled Meds:  enoxaparin (LOVENOX) injection  40 mg Subcutaneous Q24H   lacosamide  100 mg Oral Q12H   Continuous Infusions:  azithromycin 500 mg (01/17/23 1111)   cefTRIAXone (ROCEPHIN)  IV 1 g (01/17/23 0944)     LOS: 0 days    Time spent: 56 minutes spent on chart review, discussion with nursing staff, consultants, updating family and interview/physical exam; more than 50% of that time was spent in counseling and/or coordination of care.    Alvira Philips [place], DO [facility] Available via Epic secure chat 7am-7pm After these hours, please refer to coverage provider listed on [email or web address] 01/17/2023, 12:29 PM

## 2023-01-18 DIAGNOSIS — B341 Enterovirus infection, unspecified: Secondary | ICD-10-CM | POA: Diagnosis not present

## 2023-01-18 DIAGNOSIS — J189 Pneumonia, unspecified organism: Secondary | ICD-10-CM | POA: Diagnosis not present

## 2023-01-18 DIAGNOSIS — B348 Other viral infections of unspecified site: Secondary | ICD-10-CM

## 2023-01-18 LAB — CBC
HCT: 40.9 % (ref 39.0–52.0)
Hemoglobin: 13.9 g/dL (ref 13.0–17.0)
MCH: 29.5 pg (ref 26.0–34.0)
MCHC: 34 g/dL (ref 30.0–36.0)
MCV: 86.8 fL (ref 80.0–100.0)
Platelets: 285 10*3/uL (ref 150–400)
RBC: 4.71 MIL/uL (ref 4.22–5.81)
RDW: 12.1 % (ref 11.5–15.5)
WBC: 9.9 10*3/uL (ref 4.0–10.5)
nRBC: 0 % (ref 0.0–0.2)

## 2023-01-18 LAB — CULTURE, RESPIRATORY W GRAM STAIN

## 2023-01-18 LAB — BASIC METABOLIC PANEL
Anion gap: 8 (ref 5–15)
BUN: 8 mg/dL (ref 6–20)
CO2: 25 mmol/L (ref 22–32)
Calcium: 8.7 mg/dL — ABNORMAL LOW (ref 8.9–10.3)
Chloride: 104 mmol/L (ref 98–111)
Creatinine, Ser: 0.94 mg/dL (ref 0.61–1.24)
GFR, Estimated: >60 mL/min (ref >60)
Glucose, Bld: 97 mg/dL (ref 70–99)
Potassium: 3.8 mmol/L (ref 3.5–5.1)
Sodium: 137 mmol/L (ref 135–145)

## 2023-01-18 LAB — C3 COMPLEMENT: C3 Complement: 131 mg/dL (ref 82–167)

## 2023-01-18 LAB — RHEUMATOID FACTOR: Rheumatoid fact SerPl-aCnc: <10 IU/mL (ref <14.0)

## 2023-01-18 LAB — C4 COMPLEMENT: Complement C4, Body Fluid: 26 mg/dL (ref 12–38)

## 2023-01-18 LAB — CULTURE, BLOOD (ROUTINE X 2): Culture: NO GROWTH

## 2023-01-18 MED ORDER — CEFDINIR 300 MG PO CAPS: 300.0000 mg | ORAL_CAPSULE | Freq: Two times a day (BID) | ORAL | 0 refills | Status: AC

## 2023-01-18 MED ORDER — AZITHROMYCIN 500 MG PO TABS: 500.0000 mg | ORAL_TABLET | Freq: Every day | ORAL | 0 refills | Status: AC

## 2023-01-18 MED ORDER — GUAIFENESIN ER 600 MG PO TB12: 600.0000 mg | ORAL_TABLET | Freq: Two times a day (BID) | ORAL | 0 refills | Status: AC

## 2023-01-18 MED ORDER — OXYCODONE HCL 5 MG PO TABS: 5.0000 mg | ORAL_TABLET | Freq: Four times a day (QID) | ORAL | 0 refills | Status: DC | PRN

## 2023-01-18 NOTE — Discharge Summary (Signed)
Physician Discharge Summary  Nathan Daugherty RUE:[id number] DOB: 1990/12/04 DOA: 01/16/2023  PCP: Tish Frederickson, MD  Admit date: 01/16/2023 Discharge date: 01/18/2023  Admitted From: Home Disposition: Home  Recommendations for Outpatient Follow-up:  Follow up with PCP in 1-2 weeks Follow-up with neurology, Dr. Duke Salvia as scheduled Continue antibiotics with azithromycin and cefdinir to complete course for community-acquired pneumonia Pain control, antitussives for rhinovirus/enterovirus viral infection  Home Health: No Equipment/Devices: None  Discharge Condition: Stable CODE STATUS: Full code Diet recommendation: Regular diet  History of present illness:  Nathan Daugherty is a 32 y.o. 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  Reports over the last 1 week felt like he has been suffering from my viral illness with progressive left lower quadrant abdominal pain with subjective fever/chills, muscle aches, productive cough of thick yellow sputum.   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  Recently completed a course of steroids early March 2024.   In the ED, temperature 101.4 F, HR 103, RR 27, BP 129/89, SpO2 98% on room air.  WBC 16.8, hemoglobin 14.5, platelets 317.  Sodium 134, potassium 3.4, chloride 104, CO2 19, glucose 141, BUN 8, creatinine 1.13.  AST 27, ALT 20, total bilirubin 0.7.  Lipase 29.  Lactic acid 3.1.  INR 1.1.  COVID/RSV/influenza PCR negative.  Chest x-ray with small patchy density right upper lobe.  CT abdomen/pelvis with no acute finding.  Blood cultures x 2 obtained.  TRH consulted for admission for further evaluation management of  commune acquired pneumonia, fever, lactic acidosis.  Patient was transferred to [facility] for further care.  Hospital course:  Severe sepsis, POA Community-acquired pneumonia Lactic acidosis: Resolved. Rhinovirus/enterovirus viral infection Presenting to ED with fever, chills, abdominal pain, body aches over the last week.  On admission patient was on to be tachycardic, tachypneic with elevated WBC count/lactic acid and fever 101.4 F.  Chest x-ray notable for right upper lobe infiltrate.  CT abdomen/pelvis negative.  Respiratory viral panel positive for rhinovirus/enterovirus.  Patient was started on empiric antibiotics with azithromycin and ceftriaxone.  WBC count normalized to 9.9 at time of discharge.  Patient has been fever free for greater than 24 hours and will discharge home with azithromycin and cefdinir to complete antibiotic course.  Discussed supportive care, Tylenol, pain control, antitussives for symptomatic treatment of viral infection.  Outpatient follow-up with PCP.   Hx seizure disorder Hx autoimmune encephalitis/leptomeningeal enhancement Follows with [facility] neurology, Dr. Duke Salvia.  Case was discussed with Dr. Duke Salvia by admitting physician who believes that his current presentation not directly related to his previous diagnosis and recommends no specific therapy or imaging at this time.  But if concern for encephalitis develops neck step would be LP.  Given his fever and nonlocalizing symptoms may be also related to a vasculitis or other autoimmune condition.  ANCA, ANA, aldolase, antiscleroderma antibody, C3, C4, CCP all negative. Outpatient follow-up with [facility] neurology, Dr. Duke Salvia  Discharge Diagnoses:  Principal Problem:   Sepsis Active Problems:   Hypokalemia   CAP (community acquired pneumonia)   Abdominal pain   Lactic acidosis   Migraines    Discharge Instructions  Discharge Instructions     Call MD for:  difficulty  breathing, headache or visual disturbances   Complete by: As directed  Call MD for:  extreme fatigue   Complete by: As directed    Call MD for:  persistant dizziness or light-headedness   Complete by: As directed    Call MD for:  persistant nausea and vomiting   Complete by: As directed    Call MD for:  severe uncontrolled pain   Complete by: As directed    Call MD for:  temperature >100.4   Complete by: As directed    Diet - low sodium heart healthy   Complete by: As directed    Increase activity slowly   Complete by: As directed       Allergies as of 01/18/2023   No Known Allergies      Medication List     STOP taking these medications    ibuprofen 600 MG tablet Commonly known as: ADVIL   oxyCODONE-acetaminophen 5-325 MG tablet Commonly known as: Percocet       TAKE these medications    acetaminophen 500 MG tablet Commonly known as: TYLENOL Take 500 mg by mouth every 6 (six) hours as needed for moderate pain.   azithromycin 500 MG tablet Commonly known as: Zithromax Take 1 tablet (500 mg total) by mouth daily for 3 days. Take 1 tablet daily for 3 days. Start taking on: January 19, 2023   cefdinir 300 MG capsule Commonly known as: OMNICEF Take 1 capsule (300 mg total) by mouth 2 (two) times daily for 5 days. Start taking on: January 19, 2023   dicyclomine 20 MG tablet Commonly known as: BENTYL Take 1 tablet (20 mg total) by mouth 2 (two) times daily as needed for spasms.   escitalopram 10 MG tablet Commonly known as: LEXAPRO Take 10 mg by mouth daily.   feeding supplement Liqd Take 1 Container by mouth 3 (three) times daily between meals.   fluticasone 50 MCG/ACT nasal spray Commonly known as: FLONASE Place 1 spray into both nostrils daily as needed for allergies.   guaiFENesin 600 MG 12 hr tablet Commonly known as: Mucinex Take 1 tablet (600 mg total) by mouth 2 (two) times daily for 14 days. What changed:  when to take this reasons to take  this   ondansetron 4 MG tablet Commonly known as: ZOFRAN Take 1 tablet (4 mg total) by mouth every 6 (six) hours as needed for nausea or vomiting.   oxyCODONE 5 MG immediate release tablet Commonly known as: Roxicodone Take 1 tablet (5 mg total) by mouth every 6 (six) hours as needed for moderate pain.   saccharomyces boulardii 250 MG capsule Commonly known as: FLORASTOR Take 1 capsule (250 mg total) by mouth 2 (two) times daily.   Vimpat 100 MG Tabs Generic drug: Lacosamide Take 1 tablet by mouth every 12 (twelve) hours.        No Known Allergies  Consultations: Admitting physician discussed with patient's primary neurologist, Dr. Duke Salvia at [facility] on 4/11   Procedures/Studies: CT ABDOMEN PELVIS W CONTRAST  Result Date: 01/16/2023 CLINICAL DATA:  Left lower quadrant abdominal pain. EXAM: CT ABDOMEN AND PELVIS WITH CONTRAST TECHNIQUE: Multidetector CT imaging of the abdomen and pelvis was performed using the standard protocol following bolus administration of intravenous contrast. RADIATION DOSE REDUCTION: This exam was performed according to the departmental dose-optimization program which includes automated exposure control, adjustment of the mA and/or kV according to patient size and/or use of iterative reconstruction technique. CONTRAST:  OMNIPAQUE IOHEXOL 300 MG/ML  SOLN COMPARISON:  01/09/2023 FINDINGS: Lower chest:  No contributory findings. Hepatobiliary: No  focal liver abnormality.No evidence of biliary obstruction or stone. Pancreas: Unremarkable. Spleen: Unremarkable. Adrenals/Urinary Tract: Negative adrenals. No hydronephrosis or stone. Unremarkable bladder. Stomach/Bowel: No obstruction. No appendicitis. Submucosal fat in the terminal ileum and appendix, nonspecific but there is history of inflammatory bowel disease. Vascular/Lymphatic: No acute vascular abnormality. No mass or adenopathy. Reproductive:No pathologic findings. Other: No ascites or pneumoperitoneum.  Musculoskeletal: No acute abnormalities. IMPRESSION: No acute finding or explanation for symptoms. Electronically Signed   By: Tiburcio Pea M.D.   On: 01/16/2023 07:00   DG Chest Portable 1 View  Result Date: 01/16/2023 CLINICAL DATA:  Abdominal pain, fever and cough. EXAM: PORTABLE CHEST 1 VIEW COMPARISON:  01/09/2023 FINDINGS: The heart size and mediastinal contours are within normal limits. No pleural fluid or airspace disease. There is a new small patchy density identified within the right upper lobe. The visualized skeletal structures are unremarkable. IMPRESSION: New small patchy density identified within the right upper lobe. Cannot rule out early pneumonia. Electronically Signed   By: Signa Kell M.D.   On: 01/16/2023 06:09   CT Abdomen Pelvis W Contrast  Result Date: 01/09/2023 CLINICAL DATA:  Right lower quadrant abdominal pain. EXAM: CT ABDOMEN AND PELVIS WITH CONTRAST TECHNIQUE: Multidetector CT imaging of the abdomen and pelvis was performed using the standard protocol following bolus administration of intravenous contrast. RADIATION DOSE REDUCTION: This exam was performed according to the departmental dose-optimization program which includes automated exposure control, adjustment of the mA and/or kV according to patient size and/or use of iterative reconstruction technique. CONTRAST:  OMNIPAQUE IOHEXOL 300 MG/ML  SOLN COMPARISON:  CT abdomen pelvis dated December 05, 2022. FINDINGS: Lower chest: No acute abnormality. Slightly ill-defined area of ground-glass density in the medial left lower lobe likely represents subsegmental atelectasis. Hepatobiliary: No focal liver abnormality is seen. No gallstones, gallbladder wall thickening, or biliary dilatation. Pancreas: Unremarkable. No pancreatic ductal dilatation or surrounding inflammatory changes. Spleen: Normal in size without focal abnormality. Adrenals/Urinary Tract: Adrenal glands are unremarkable. Kidneys are normal, without renal  calculi, focal lesion, or hydronephrosis. Bladder is unremarkable. Stomach/Bowel: Stomach is within normal limits. Appendix appears normal, other than chronic prominent submucosal fat. No evidence of bowel wall thickening, distention, or inflammatory changes. Vascular/Lymphatic: No significant vascular findings are present. No enlarged abdominal or pelvic lymph nodes. Reproductive: Uterus and bilateral adnexa are unremarkable. Other: No abdominal wall hernia or abnormality. No abdominopelvic ascites. Musculoskeletal: No acute or significant osseous findings. IMPRESSION: 1. No acute intra-process.  Normal appendix. Electronically Signed   By: Obie Dredge M.D.   On: 01/09/2023 11:59   DG Chest Port 1 View  Result Date: 01/09/2023 CLINICAL DATA:  Chest pain. Weakness with tachycardia and shortness of breath. EXAM: PORTABLE CHEST 1 VIEW COMPARISON:  Radiographs 10/21/2016 and 10/13/2016. FINDINGS: 1031 hours. Suboptimal inspiration. The heart size and mediastinal contours are stable. Mild asymmetric elevation of the left hemidiaphragm is associated with new faint opacity at the left lung base, likely atelectasis. The right lung appears clear. No pleural effusion or pneumothorax. The bones appear unremarkable.  Telemetry leads overlie the chest. IMPRESSION: Suboptimal inspiration with new faint left basilar opacity, likely atelectasis. Electronically Signed   By: Carey Bullocks M.D.   On: 01/09/2023 10:53     Subjective: Patient seen examined bedside, resting,.  Lying in bed.  Family present.  Has been afebrile for the past 24 hours.  White count has now normalized.  Discussed findings of respiratory viral panel with positivity for rhinovirus and enterovirus which was likely  the leading etiology of his presenting symptoms; although did have developing focal infiltrate right upper lobe and will continue antibiotics on discharge.  Continues with cough and chest discomfort, mild shortness of breath; otherwise no  other specific questions or concerns at this time.  Denies headache, no dizziness, no current fever/chills/night sweats, no nausea/vomiting/diarrhea, no focal weakness, no congestion, no paresthesias.  No acute events overnight per nursing staff.  Discharge Exam: Vitals:   01/17/23 2153 01/18/23 0555  BP: 120/76 110/65  Pulse: 73 (!) 55  Resp: 16 19  Temp: 97.9 F (36.6 C) 97.6 F (36.4 C)  SpO2: 94% 98%   Vitals:   01/17/23 1253 01/17/23 1544 01/17/23 2153 01/18/23 0555  BP: 135/81 120/75 120/76 110/65  Pulse: 97 82 73 (!) 55  Resp:   16 19  Temp:  98.6 F (37 C) 97.9 F (36.6 C) 97.6 F (36.4 C)  TempSrc:  Oral Oral Oral  SpO2:  96% 94% 98%  Weight:      Height:        Physical Exam: GEN: NAD, alert and oriented x 3, wd/wn HEENT: NCAT, PERRL, EOMI, sclera clear, MMM PULM: CTAB w/o wheezes/crackles, normal respiratory effort, on room air CV: RRR w/o M/G/R GI: abd soft, NTND, NABS, no R/G/M MSK: no peripheral edema, muscle strength globally intact 5/5 bilateral upper/lower extremities NEURO: CN II-XII intact, no focal deficits, sensation to light touch intact PSYCH: normal mood/affect Integumentary: dry/intact, no rashes or wounds    The results of significant diagnostics from this hospitalization (including imaging, microbiology, ancillary and laboratory) are listed below for reference.     Microbiology: Recent Results (from the past 240 hour(s))  SARS Coronavirus 2 by RT PCR (hospital order, performed in [facility] hospital lab) *cepheid single result test* Anterior Nasal Swab     Status: None   Collection Time: 01/09/23 10:50 AM   Specimen: Anterior Nasal Swab  Result Value Ref Range Status   SARS Coronavirus 2 by RT PCR NEGATIVE NEGATIVE Final    Comment: (NOTE) SARS-CoV-2 target nucleic acids are NOT DETECTED.  The SARS-CoV-2 RNA is generally detectable in upper and lower respiratory specimens during the acute phase of infection. The lowest concentration  of SARS-CoV-2 viral copies this assay can detect is 250 copies / mL. A negative result does not preclude SARS-CoV-2 infection and should not be used as the sole basis for treatment or other patient management decisions.  A negative result may occur with improper specimen collection / handling, submission of specimen other than nasopharyngeal swab, presence of viral mutation(s) within the areas targeted by this assay, and inadequate number of viral copies (<250 copies / mL). A negative result must be combined with clinical observations, patient history, and epidemiological information.  Fact Sheet for Patients:   RoadLapTop.co.za  Fact Sheet for Healthcare Providers: http://kim-miller.com/  This test is not yet approved or  cleared by the [place] FDA and has been authorized for detection and/or diagnosis of SARS-CoV-2 by FDA under an Emergency Use Authorization (EUA).  This EUA will remain in effect (meaning this test can be used) for the duration of the COVID-19 declaration under Section 564(b)(1) of the Act, 21 U.S.C. section 360bbb-3(b)(1), unless the authorization is terminated or revoked sooner.  Performed at [facility], [address] [place], [place] [address]   Culture, blood (Routine x 2)     Status: None (Preliminary result)   Collection Time: 01/16/23  5:35 AM   Specimen: BLOOD  Result  Value Ref Range Status   Specimen Description   Final    BLOOD RIGHT ANTECUBITAL Performed at [facility], [address] [place], [place] [address]    Special Requests   Final    BOTTLES DRAWN AEROBIC AND ANAEROBIC Blood Culture adequate volume Performed at [facility], [address] [place], [place] [address]    Culture   Final    NO GROWTH 2 DAYS Performed at [facility], [address], [place], [place] [address]    Report Status PENDING  Incomplete  Resp panel by RT-PCR (RSV, Flu A&B,  Covid) Anterior Nasal Swab     Status: None   Collection Time: 01/16/23  5:44 AM   Specimen: Anterior Nasal Swab  Result Value Ref Range Status   SARS Coronavirus 2 by RT PCR NEGATIVE NEGATIVE Final    Comment: (NOTE) SARS-CoV-2 target nucleic acids are NOT DETECTED.  The SARS-CoV-2 RNA is generally detectable in upper respiratory specimens during the acute phase of infection. The lowest concentration of SARS-CoV-2 viral copies this assay can detect is 138 copies/mL. A negative result does not preclude SARS-Cov-2 infection and should not be used as the sole basis for treatment or other patient management decisions. A negative result may occur with  improper specimen collection/handling, submission of specimen other than nasopharyngeal swab, presence of viral mutation(s) within the areas targeted by this assay, and inadequate number of viral copies(<138 copies/mL). A negative result must be combined with clinical observations, patient history, and epidemiological information. The expected result is Negative.  Fact Sheet for Patients:  BloggerCourse.com  Fact Sheet for Healthcare Providers:  SeriousBroker.it  This test is no t yet approved or cleared by the [place] FDA and  has been authorized for detection and/or diagnosis of SARS-CoV-2 by FDA under an Emergency Use Authorization (EUA). This EUA will remain  in effect (meaning this test can be used) for the duration of the COVID-19 declaration under Section 564(b)(1) of the Act, 21 U.S.C.section 360bbb-3(b)(1), unless the authorization is terminated  or revoked sooner.       Influenza A by PCR NEGATIVE NEGATIVE Final   Influenza B by PCR NEGATIVE NEGATIVE Final    Comment: (NOTE) The Xpert Xpress SARS-CoV-2/FLU/RSV plus assay is intended as an aid in the diagnosis of influenza from Nasopharyngeal swab specimens and should not be used as a sole basis for treatment. Nasal  washings and aspirates are unacceptable for Xpert Xpress SARS-CoV-2/FLU/RSV testing.  Fact Sheet for Patients: BloggerCourse.com  Fact Sheet for Healthcare Providers: SeriousBroker.it  This test is not yet approved or cleared by the [place] FDA and has been authorized for detection and/or diagnosis of SARS-CoV-2 by FDA under an Emergency Use Authorization (EUA). This EUA will remain in effect (meaning this test can be used) for the duration of the COVID-19 declaration under Section 564(b)(1) of the Act, 21 U.S.C. section 360bbb-3(b)(1), unless the authorization is terminated or revoked.     Resp Syncytial Virus by PCR NEGATIVE NEGATIVE Final    Comment: (NOTE) Fact Sheet for Patients: BloggerCourse.com  Fact Sheet for Healthcare Providers: SeriousBroker.it  This test is not yet approved or cleared by the [place] FDA and has been authorized for detection and/or diagnosis of SARS-CoV-2 by FDA under an Emergency Use Authorization (EUA). This EUA will remain in effect (meaning this test can be used) for the duration of the COVID-19 declaration under Section 564(b)(1) of the Act, 21 U.S.C. section 360bbb-3(b)(1), unless the  authorization is terminated or revoked.  Performed at [facility] [address] [place], [place] [address]   Culture, blood (Routine x 2)     Status: None (Preliminary result)   Collection Time: 01/16/23  6:10 AM   Specimen: BLOOD  Result Value Ref Range Status   Specimen Description   Final    BLOOD LEFT ANTECUBITAL Performed at [facility], [address], [place], [place] [address]    Special Requests   Final    BOTTLES DRAWN AEROBIC AND ANAEROBIC Blood Culture adequate volume Performed at [facility], [address] [place], [place] [address]    Culture   Final    NO GROWTH 2 DAYS Performed at [facility], [address], [place], [place] [address]    Report Status PENDING  Incomplete  Respiratory (~20 pathogens) panel by PCR     Status: Abnormal   Collection Time: 01/17/23  9:10 AM   Specimen: Nasopharyngeal Swab; Respiratory  Result Value Ref Range Status   Adenovirus NOT DETECTED NOT DETECTED Final   Coronavirus 229E NOT DETECTED NOT DETECTED Final    Comment: (NOTE) The Coronavirus on the Respiratory Panel, DOES NOT test for the novel  Coronavirus (2019 nCoV)    Coronavirus HKU1 NOT DETECTED NOT DETECTED Final   Coronavirus NL63 NOT DETECTED NOT DETECTED Final   Coronavirus OC43 NOT DETECTED NOT DETECTED Final   Metapneumovirus NOT DETECTED NOT DETECTED Final   Rhinovirus / Enterovirus DETECTED (A) NOT DETECTED Final   Influenza A NOT DETECTED NOT DETECTED Final   Influenza B NOT DETECTED NOT DETECTED Final   Parainfluenza Virus 1 NOT DETECTED NOT DETECTED Final   Parainfluenza Virus 2 NOT DETECTED NOT DETECTED Final   Parainfluenza Virus 3 NOT DETECTED NOT DETECTED Final   Parainfluenza Virus 4 NOT DETECTED NOT DETECTED Final   Respiratory Syncytial Virus NOT DETECTED NOT DETECTED Final   Bordetella pertussis NOT DETECTED NOT DETECTED Final   Bordetella Parapertussis NOT DETECTED NOT DETECTED Final   Chlamydophila pneumoniae NOT DETECTED NOT DETECTED Final   Mycoplasma pneumoniae NOT DETECTED NOT DETECTED Final    Comment: Performed at [facility], [address], [place], [place] [address]  Expectorated Sputum Assessment w Gram Stain, Rflx to Resp Cult     Status: None   Collection Time: 01/17/23  9:19 AM   Specimen: Nasopharyngeal Swab; Sputum  Result Value Ref Range Status   Specimen Description EXPECTORATED SPUTUM  Final   Special Requests NONE  Final   Sputum evaluation   Final    THIS SPECIMEN IS ACCEPTABLE FOR SPUTUM CULTURE Performed at [facility], [address], [place], [place] [address]    Report Status 01/17/2023 FINAL   Final  Culture, Respiratory w Gram Stain     Status: None (Preliminary result)   Collection Time: 01/17/23  9:19 AM  Result Value Ref Range Status   Specimen Description   Final    EXPECTORATED SPUTUM Performed at [facility], [address], [place], [place] [address]    Special Requests   Final    NONE Reflexed from [id number] Performed at [facility], [address], [place], [place] [address]    Gram Stain   Final    ABUNDANT WBC PRESENT, PREDOMINANTLY PMN RARE GRAM NEGATIVE RODS Performed at [facility], [address], [place], [place] [address]    Culture PENDING  Incomplete  Report Status PENDING  Incomplete     Labs: BNP (last 3 results) No results for input(s): "BNP" in the last 8760 hours. Basic Metabolic Panel: Recent Labs  Lab 01/16/23 0543 01/17/23 0708 01/18/23 0338  NA 134* 134* 137  K 3.4* 3.5 3.8  CL 104 103 104  CO2 19* 23 25  GLUCOSE 141* 94 97  BUN 8 7 8   CREATININE 1.13 0.91 0.94  CALCIUM 9.0 8.6* 8.7*   Liver Function Tests: Recent Labs  Lab 01/16/23 0543  AST 27  ALT 20  ALKPHOS 75  BILITOT 0.7  PROT 7.4  ALBUMIN 4.2   Recent Labs  Lab 01/16/23 0621  LIPASE 29   No results for input(s): "AMMONIA" in the last 168 hours. CBC: Recent Labs  Lab 01/16/23 0543 01/17/23 0708 01/18/23 0338  WBC 16.8* 16.3* 9.9  NEUTROABS 11.2*  --   --   HGB 14.5 13.5 13.9  HCT 41.0 39.9 40.9  MCV 83.7 86.9 86.8  PLT 317 264 285   Cardiac Enzymes: Recent Labs  Lab 01/16/23 1444  CKTOTAL 102   BNP: Invalid input(s): "POCBNP" CBG: No results for input(s): "GLUCAP" in the last 168 hours. D-Dimer No results for input(s): "DDIMER" in the last 72 hours. Hgb A1c No results for input(s): "HGBA1C" in the last 72 hours. Lipid Profile No results for input(s): "CHOL", "HDL", "LDLCALC", "TRIG", "CHOLHDL", "LDLDIRECT" in the last 72 hours. Thyroid function studies No results for input(s): "TSH",  "T4TOTAL", "T3FREE", "THYROIDAB" in the last 72 hours.  Invalid input(s): "FREET3" Anemia work up Recent Labs    01/16/23 1444  FERRITIN 118   Urinalysis    Component Value Date/Time   COLORURINE YELLOW 01/16/2023 0616   APPEARANCEUR CLEAR 01/16/2023 0616   LABSPEC 1.020 01/16/2023 0616   PHURINE 7.0 01/16/2023 0616   GLUCOSEU NEGATIVE 01/16/2023 0616   HGBUR MODERATE (A) 01/16/2023 0616   BILIRUBINUR NEGATIVE 01/16/2023 0616   KETONESUR NEGATIVE 01/16/2023 0616   PROTEINUR NEGATIVE 01/16/2023 0616   UROBILINOGEN 0.2 12/09/2013 0330   NITRITE NEGATIVE 01/16/2023 0616   LEUKOCYTESUR NEGATIVE 01/16/2023 0616   Sepsis Labs Recent Labs  Lab 01/16/23 0543 01/17/23 0708 01/18/23 0338  WBC 16.8* 16.3* 9.9   Microbiology Recent Results (from the past 240 hour(s))  SARS Coronavirus 2 by RT PCR (hospital order, performed in [facility] hospital lab) *cepheid single result test* Anterior Nasal Swab     Status: None   Collection Time: 01/09/23 10:50 AM   Specimen: Anterior Nasal Swab  Result Value Ref Range Status   SARS Coronavirus 2 by RT PCR NEGATIVE NEGATIVE Final    Comment: (NOTE) SARS-CoV-2 target nucleic acids are NOT DETECTED.  The SARS-CoV-2 RNA is generally detectable in upper and lower respiratory specimens during the acute phase of infection. The lowest concentration of SARS-CoV-2 viral copies this assay can detect is 250 copies / mL. A negative result does not preclude SARS-CoV-2 infection and should not be used as the sole basis for treatment or other patient management decisions.  A negative result may occur with improper specimen collection / handling, submission of specimen other than nasopharyngeal swab, presence of viral mutation(s) within the areas targeted by this assay, and inadequate number of viral copies (<250 copies / mL). A negative result must be combined with clinical observations, patient history, and epidemiological information.  Fact Sheet  for Patients:   RoadLapTop.co.za  Fact Sheet for Healthcare Providers: http://kim-miller.com/  This test is not yet approved or  cleared by the  [place] [employer] and has been authorized for detection and/or diagnosis of SARS-CoV-2 by FDA under an TEFL teacher (EUA).  This EUA will remain in effect (meaning this test can be used) for the duration of the COVID-19 declaration under Section 564(b)(1) of the Act, 21 U.S.C. section 360bbb-3(b)(1), unless the authorization is terminated or revoked sooner.  Performed at [facility], [address] [place], [place] [address]   Culture, blood (Routine x 2)     Status: None (Preliminary result)   Collection Time: 01/16/23  5:35 AM   Specimen: BLOOD  Result Value Ref Range Status   Specimen Description   Final    BLOOD RIGHT ANTECUBITAL Performed at [facility], [address], [place], [place] [address]    Special Requests   Final    BOTTLES DRAWN AEROBIC AND ANAEROBIC Blood Culture adequate volume Performed at [facility], [address] [place], [place] [address]    Culture   Final    NO GROWTH 2 DAYS Performed at [facility], [address] [place], [place] [address]    Report Status PENDING  Incomplete  Resp panel by RT-PCR (RSV, Flu A&B, Covid) Anterior Nasal Swab     Status: None   Collection Time: 01/16/23  5:44 AM   Specimen: Anterior Nasal Swab  Result Value Ref Range Status   SARS Coronavirus 2 by RT PCR NEGATIVE NEGATIVE Final    Comment: (NOTE) SARS-CoV-2 target nucleic acids are NOT DETECTED.  The SARS-CoV-2 RNA is generally detectable in upper respiratory specimens during the acute phase of infection. The lowest concentration of SARS-CoV-2 viral copies this assay can detect is 138 copies/mL. A negative result does not preclude SARS-Cov-2 infection and should not be used as the sole basis for treatment or other patient  management decisions. A negative result may occur with  improper specimen collection/handling, submission of specimen other than nasopharyngeal swab, presence of viral mutation(s) within the areas targeted by this assay, and inadequate number of viral copies(<138 copies/mL). A negative result must be combined with clinical observations, patient history, and epidemiological information. The expected result is Negative.  Fact Sheet for Patients:  BloggerCourse.com  Fact Sheet for Healthcare Providers:  SeriousBroker.it  This test is no t yet approved or cleared by the [place] FDA and  has been authorized for detection and/or diagnosis of SARS-CoV-2 by FDA under an Emergency Use Authorization (EUA). This EUA will remain  in effect (meaning this test can be used) for the duration of the COVID-19 declaration under Section 564(b)(1) of the Act, 21 U.S.C.section 360bbb-3(b)(1), unless the authorization is terminated  or revoked sooner.       Influenza A by PCR NEGATIVE NEGATIVE Final   Influenza B by PCR NEGATIVE NEGATIVE Final    Comment: (NOTE) The Xpert Xpress SARS-CoV-2/FLU/RSV plus assay is intended as an aid in the diagnosis of influenza from Nasopharyngeal swab specimens and should not be used as a sole basis for treatment. Nasal washings and aspirates are unacceptable for Xpert Xpress SARS-CoV-2/FLU/RSV testing.  Fact Sheet for Patients: BloggerCourse.com  Fact Sheet for Healthcare Providers: SeriousBroker.it  This test is not yet approved or cleared by the [place] FDA and has been authorized for detection and/or diagnosis of SARS-CoV-2 by FDA under an Emergency Use Authorization (EUA). This EUA will remain in effect (meaning this test can be used) for the duration of the COVID-19 declaration under Section 564(b)(1) of the Act, 21  U.S.C. section 360bbb-3(b)(1),  unless the authorization is terminated or revoked.     Resp Syncytial Virus by PCR NEGATIVE NEGATIVE Final    Comment: (NOTE) Fact Sheet for Patients: BloggerCourse.com  Fact Sheet for Healthcare Providers: SeriousBroker.it  This test is not yet approved or cleared by the [place] FDA and has been authorized for detection and/or diagnosis of SARS-CoV-2 by FDA under an Emergency Use Authorization (EUA). This EUA will remain in effect (meaning this test can be used) for the duration of the COVID-19 declaration under Section 564(b)(1) of the Act, 21 U.S.C. section 360bbb-3(b)(1), unless the authorization is terminated or revoked.  Performed at [facility], [address] [place], [place] [address]   Culture, blood (Routine x 2)     Status: None (Preliminary result)   Collection Time: 01/16/23  6:10 AM   Specimen: BLOOD  Result Value Ref Range Status   Specimen Description   Final    BLOOD LEFT ANTECUBITAL Performed at [facility], [address], [place], [place] [address]    Special Requests   Final    BOTTLES DRAWN AEROBIC AND ANAEROBIC Blood Culture adequate volume Performed at [facility], [address] [place], [place] [address]    Culture   Final    NO GROWTH 2 DAYS Performed at [facility], [address], [place], [place] [address]    Report Status PENDING  Incomplete  Respiratory (~20 pathogens) panel by PCR     Status: Abnormal   Collection Time: 01/17/23  9:10 AM   Specimen: Nasopharyngeal Swab; Respiratory  Result Value Ref Range Status   Adenovirus NOT DETECTED NOT DETECTED Final   Coronavirus 229E NOT DETECTED NOT DETECTED Final    Comment: (NOTE) The Coronavirus on the Respiratory Panel, DOES NOT test for the novel  Coronavirus (2019 nCoV)    Coronavirus HKU1 NOT DETECTED NOT DETECTED Final   Coronavirus NL63 NOT DETECTED NOT DETECTED Final   Coronavirus OC43  NOT DETECTED NOT DETECTED Final   Metapneumovirus NOT DETECTED NOT DETECTED Final   Rhinovirus / Enterovirus DETECTED (A) NOT DETECTED Final   Influenza A NOT DETECTED NOT DETECTED Final   Influenza B NOT DETECTED NOT DETECTED Final   Parainfluenza Virus 1 NOT DETECTED NOT DETECTED Final   Parainfluenza Virus 2 NOT DETECTED NOT DETECTED Final   Parainfluenza Virus 3 NOT DETECTED NOT DETECTED Final   Parainfluenza Virus 4 NOT DETECTED NOT DETECTED Final   Respiratory Syncytial Virus NOT DETECTED NOT DETECTED Final   Bordetella pertussis NOT DETECTED NOT DETECTED Final   Bordetella Parapertussis NOT DETECTED NOT DETECTED Final   Chlamydophila pneumoniae NOT DETECTED NOT DETECTED Final   Mycoplasma pneumoniae NOT DETECTED NOT DETECTED Final    Comment: Performed at [facility], [address] [place], [place] [address]  Expectorated Sputum Assessment w Gram Stain, Rflx to Resp Cult     Status: None   Collection Time: 01/17/23  9:19 AM   Specimen: Nasopharyngeal Swab; Sputum  Result Value Ref Range Status   Specimen Description EXPECTORATED SPUTUM  Final   Special Requests NONE  Final   Sputum evaluation   Final    THIS SPECIMEN IS ACCEPTABLE FOR SPUTUM CULTURE Performed at [facility], [address], [place], [place] [address]    Report Status 01/17/2023 FINAL  Final  Culture, Respiratory w Gram Stain     Status: None (Preliminary result)   Collection Time: 01/17/23  9:19 AM  Result Value Ref Range Status   Specimen Description   Final    EXPECTORATED SPUTUM Performed at [facility], [address], [place], [place] [address]    Special Requests   Final    NONE Reflexed from [id number] Performed at [facility], [address], [place], [place] [address]    Gram Stain   Final    ABUNDANT WBC PRESENT, PREDOMINANTLY PMN RARE GRAM NEGATIVE RODS Performed at [facility], [address], [place], [place] [address]     Culture PENDING  Incomplete   Report Status PENDING  Incomplete     Time coordinating discharge: Over 30 minutes  SIGNED:   Alvira Philips [place], DO  [facility] 01/18/2023, 8:42 AM

## 2023-01-18 NOTE — Progress Notes (Signed)
Patient completed both IV antibiotics prior to discharge. Discussed AVS with patient verbalizing understanding. Patient was transported to [facility] with all bedside belongings and entered vehicle with his wife to be safely transported home.

## 2023-01-18 NOTE — Plan of Care (Signed)

## 2023-01-19 LAB — CULTURE, BLOOD (ROUTINE X 2)

## 2023-01-19 LAB — CULTURE, RESPIRATORY W GRAM STAIN

## 2023-01-20 LAB — CULTURE, BLOOD (ROUTINE X 2): Culture: NO GROWTH

## 2023-01-21 LAB — CULTURE, BLOOD (ROUTINE X 2)

## 2023-05-20 DIAGNOSIS — R519 Headache, unspecified: Secondary | ICD-10-CM | POA: Diagnosis not present

## 2023-05-20 DIAGNOSIS — R051 Acute cough: Secondary | ICD-10-CM | POA: Diagnosis not present

## 2023-05-20 DIAGNOSIS — R059 Cough, unspecified: Secondary | ICD-10-CM | POA: Diagnosis not present

## 2023-05-20 DIAGNOSIS — F17200 Nicotine dependence, unspecified, uncomplicated: Secondary | ICD-10-CM | POA: Diagnosis not present

## 2023-05-20 DIAGNOSIS — Z20822 Contact with and (suspected) exposure to covid-19: Secondary | ICD-10-CM | POA: Diagnosis not present

## 2023-05-20 DIAGNOSIS — R1031 Right lower quadrant pain: Secondary | ICD-10-CM | POA: Diagnosis not present

## 2023-05-20 DIAGNOSIS — R109 Unspecified abdominal pain: Secondary | ICD-10-CM | POA: Diagnosis not present

## 2023-05-23 ENCOUNTER — Emergency Department (HOSPITAL_BASED_OUTPATIENT_CLINIC_OR_DEPARTMENT_OTHER): Payer: BC Managed Care – PPO

## 2023-05-23 ENCOUNTER — Encounter (HOSPITAL_BASED_OUTPATIENT_CLINIC_OR_DEPARTMENT_OTHER): Payer: Self-pay

## 2023-05-23 ENCOUNTER — Observation Stay (HOSPITAL_BASED_OUTPATIENT_CLINIC_OR_DEPARTMENT_OTHER)
Admission: EM | Admit: 2023-05-23 | Discharge: 2023-05-25 | Disposition: A | Discharge status: Home / Self Care | Payer: BC Managed Care – PPO | Attending: Internal Medicine | Admitting: Internal Medicine

## 2023-05-23 ENCOUNTER — Other Ambulatory Visit: Payer: Self-pay

## 2023-05-23 DIAGNOSIS — A419 Sepsis, unspecified organism: Principal | ICD-10-CM

## 2023-05-23 DIAGNOSIS — Z87891 Personal history of nicotine dependence: Secondary | ICD-10-CM | POA: Insufficient documentation

## 2023-05-23 DIAGNOSIS — G8929 Other chronic pain: Secondary | ICD-10-CM | POA: Diagnosis not present

## 2023-05-23 DIAGNOSIS — G43909 Migraine, unspecified, not intractable, without status migrainosus: Secondary | ICD-10-CM | POA: Diagnosis not present

## 2023-05-23 DIAGNOSIS — D72829 Elevated white blood cell count, unspecified: Secondary | ICD-10-CM | POA: Diagnosis not present

## 2023-05-23 DIAGNOSIS — E876 Hypokalemia: Secondary | ICD-10-CM | POA: Diagnosis not present

## 2023-05-23 DIAGNOSIS — R1084 Generalized abdominal pain: Secondary | ICD-10-CM | POA: Diagnosis not present

## 2023-05-23 DIAGNOSIS — R109 Unspecified abdominal pain: Secondary | ICD-10-CM | POA: Diagnosis not present

## 2023-05-23 DIAGNOSIS — G0481 Other encephalitis and encephalomyelitis: Secondary | ICD-10-CM | POA: Insufficient documentation

## 2023-05-23 DIAGNOSIS — K588 Other irritable bowel syndrome: Secondary | ICD-10-CM | POA: Diagnosis present

## 2023-05-23 DIAGNOSIS — K920 Hematemesis: Secondary | ICD-10-CM | POA: Diagnosis not present

## 2023-05-23 DIAGNOSIS — R1110 Vomiting, unspecified: Secondary | ICD-10-CM | POA: Diagnosis not present

## 2023-05-23 DIAGNOSIS — F418 Other specified anxiety disorders: Secondary | ICD-10-CM | POA: Diagnosis present

## 2023-05-23 DIAGNOSIS — R918 Other nonspecific abnormal finding of lung field: Secondary | ICD-10-CM | POA: Diagnosis not present

## 2023-05-23 DIAGNOSIS — Z1152 Encounter for screening for COVID-19: Secondary | ICD-10-CM | POA: Insufficient documentation

## 2023-05-23 DIAGNOSIS — R6510 Systemic inflammatory response syndrome (SIRS) of non-infectious origin without acute organ dysfunction: Secondary | ICD-10-CM | POA: Diagnosis not present

## 2023-05-23 DIAGNOSIS — R519 Headache, unspecified: Secondary | ICD-10-CM | POA: Diagnosis not present

## 2023-05-23 DIAGNOSIS — R6511 Systemic inflammatory response syndrome (SIRS) of non-infectious origin with acute organ dysfunction: Secondary | ICD-10-CM | POA: Diagnosis present

## 2023-05-23 HISTORY — DX: Other encephalitis and encephalomyelitis: G04.81

## 2023-05-23 LAB — CBC WITH DIFFERENTIAL/PLATELET
Abs Immature Granulocytes: 0.14 10*3/uL — ABNORMAL HIGH (ref 0.00–0.07)
Basophils Absolute: 0.1 10*3/uL (ref 0.0–0.1)
Basophils Relative: 0 %
Eosinophils Absolute: 0.1 10*3/uL (ref 0.0–0.5)
Eosinophils Relative: 0 %
HCT: 46.7 % (ref 39.0–52.0)
Hemoglobin: 16.5 g/dL (ref 13.0–17.0)
Immature Granulocytes: 1 %
Lymphocytes Relative: 24 %
Lymphs Abs: 5.9 10*3/uL — ABNORMAL HIGH (ref 0.7–4.0)
MCH: 29.3 pg (ref 26.0–34.0)
MCHC: 35.3 g/dL (ref 30.0–36.0)
MCV: 82.8 fL (ref 80.0–100.0)
Monocytes Absolute: 2.2 10*3/uL — ABNORMAL HIGH (ref 0.1–1.0)
Monocytes Relative: 9 %
Neutro Abs: 16.5 10*3/uL — ABNORMAL HIGH (ref 1.7–7.7)
Neutrophils Relative %: 66 %
Platelets: 342 10*3/uL (ref 150–400)
RBC: 5.64 MIL/uL (ref 4.22–5.81)
RDW: 12.1 % (ref 11.5–15.5)
Smear Review: NORMAL
WBC Morphology: >10
WBC: 25 10*3/uL — ABNORMAL HIGH (ref 4.0–10.5)
nRBC: 0 % (ref 0.0–0.2)

## 2023-05-23 LAB — COMPREHENSIVE METABOLIC PANEL
ALT: 16 U/L (ref 0–44)
AST: 22 U/L (ref 15–41)
Albumin: 4.7 g/dL (ref 3.5–5.0)
Alkaline Phosphatase: 79 U/L (ref 38–126)
Anion gap: 13 (ref 5–15)
BUN: 13 mg/dL (ref 6–20)
CO2: 19 mmol/L — ABNORMAL LOW (ref 22–32)
Calcium: 9.7 mg/dL (ref 8.9–10.3)
Chloride: 101 mmol/L (ref 98–111)
Creatinine, Ser: 1.05 mg/dL (ref 0.61–1.24)
GFR, Estimated: >60 mL/min (ref >60)
Glucose, Bld: 105 mg/dL — ABNORMAL HIGH (ref 70–99)
Potassium: 3.4 mmol/L — ABNORMAL LOW (ref 3.5–5.1)
Sodium: 133 mmol/L — ABNORMAL LOW (ref 135–145)
Total Bilirubin: 0.5 mg/dL (ref 0.3–1.2)
Total Protein: 7.8 g/dL (ref 6.5–8.1)

## 2023-05-23 LAB — URINALYSIS, W/ REFLEX TO CULTURE (INFECTION SUSPECTED)
Bilirubin Urine: NEGATIVE
Glucose, UA: 100 mg/dL — AB
Hgb urine dipstick: NEGATIVE
Ketones, ur: NEGATIVE mg/dL
Leukocytes,Ua: NEGATIVE
Nitrite: NEGATIVE
Protein, ur: NEGATIVE mg/dL
Specific Gravity, Urine: 1.02 (ref 1.005–1.030)
pH: 8.5 — ABNORMAL HIGH (ref 5.0–8.0)

## 2023-05-23 LAB — RESP PANEL BY RT-PCR (RSV, FLU A&B, COVID)  RVPGX2
Influenza A by PCR: NEGATIVE
Influenza B by PCR: NEGATIVE
Resp Syncytial Virus by PCR: NEGATIVE
SARS Coronavirus 2 by RT PCR: NEGATIVE

## 2023-05-23 LAB — SEDIMENTATION RATE: Sed Rate: 3 mm/hr (ref 0–16)

## 2023-05-23 LAB — LIPASE, BLOOD: Lipase: 37 U/L (ref 11–51)

## 2023-05-23 LAB — PROTIME-INR
INR: 1 (ref 0.8–1.2)
Prothrombin Time: 13.3 seconds (ref 11.4–15.2)

## 2023-05-23 LAB — C-REACTIVE PROTEIN: CRP: 1.4 mg/dL — ABNORMAL HIGH (ref <1.0)

## 2023-05-23 LAB — LACTIC ACID, PLASMA
Lactic Acid, Venous: 0.9 mmol/L (ref 0.5–1.9)
Lactic Acid, Venous: 3.8 mmol/L (ref 0.5–1.9)
Lactic Acid, Venous: 1.2 mmol/L (ref 0.5–1.9)

## 2023-05-23 LAB — PROCALCITONIN: Procalcitonin: <0.1 ng/mL

## 2023-05-23 MED ORDER — SODIUM CHLORIDE 0.9 % IV SOLN
2.0000 g | Freq: Once | INTRAVENOUS | Status: AC
  Administered 2023-05-23: 2 g via INTRAVENOUS
  Filled 2023-05-23: qty 12.5

## 2023-05-23 MED ORDER — VANCOMYCIN HCL IN DEXTROSE 1-5 GM/200ML-% IV SOLN
1000.0000 mg | Freq: Once | INTRAVENOUS | Status: AC
  Administered 2023-05-23: 1000 mg via INTRAVENOUS
  Filled 2023-05-23: qty 200

## 2023-05-23 MED ORDER — SODIUM CHLORIDE 0.9 % IV BOLUS
1000.0000 mL | Freq: Once | INTRAVENOUS | Status: AC
  Administered 2023-05-23: 1000 mL via INTRAVENOUS

## 2023-05-23 MED ORDER — MORPHINE SULFATE (PF) 4 MG/ML IV SOLN
8.0000 mg | INTRAVENOUS | Status: DC | PRN
  Administered 2023-05-23 – 2023-05-24 (×5): 8 mg via INTRAVENOUS
  Filled 2023-05-23 (×5): qty 2

## 2023-05-23 MED ORDER — ONDANSETRON HCL 4 MG/2ML IJ SOLN
4.0000 mg | Freq: Once | INTRAMUSCULAR | Status: AC
  Administered 2023-05-23: 4 mg via INTRAVENOUS
  Filled 2023-05-23: qty 2

## 2023-05-23 MED ORDER — SODIUM CHLORIDE 0.9 % IV SOLN: INTRAVENOUS | Status: DC | PRN

## 2023-05-23 MED ORDER — METRONIDAZOLE 500 MG/100ML IV SOLN
500.0000 mg | Freq: Once | INTRAVENOUS | Status: AC
  Administered 2023-05-23: 500 mg via INTRAVENOUS
  Filled 2023-05-23: qty 100

## 2023-05-23 MED ORDER — IOHEXOL 300 MG/ML  SOLN
100.0000 mL | Freq: Once | INTRAMUSCULAR | Status: AC | PRN
  Administered 2023-05-23: 100 mL via INTRAVENOUS

## 2023-05-23 MED ORDER — SODIUM CHLORIDE 0.9 % IV BOLUS
500.0000 mL | Freq: Once | INTRAVENOUS | Status: AC
  Administered 2023-05-23: 500 mL via INTRAVENOUS

## 2023-05-23 MED ORDER — MORPHINE SULFATE (PF) 4 MG/ML IV SOLN
4.0000 mg | Freq: Once | INTRAVENOUS | Status: AC
  Administered 2023-05-23: 4 mg via INTRAVENOUS
  Filled 2023-05-23: qty 1

## 2023-05-23 MED ORDER — LACTATED RINGERS IV SOLN: INTRAVENOUS | Status: AC

## 2023-05-23 MED ORDER — PANTOPRAZOLE SODIUM 40 MG IV SOLR
40.0000 mg | Freq: Once | INTRAVENOUS | Status: AC
  Administered 2023-05-23: 40 mg via INTRAVENOUS
  Filled 2023-05-23: qty 10

## 2023-05-23 MED ORDER — HYDROMORPHONE HCL 1 MG/ML IJ SOLN
1.0000 mg | Freq: Once | INTRAMUSCULAR | Status: AC
  Administered 2023-05-23: 1 mg via INTRAVENOUS
  Filled 2023-05-23: qty 1

## 2023-05-23 NOTE — ED Provider Notes (Signed)
Care of patient received from prior provider at 3:09 PM, please see their note for complete H/P and care plan.  Received handoff per ED course.  Clinical Course as of 05/23/23 1509  Fri May 23, 2023  1507 Exquisitely complicated 32 year old male history of autoimmune encephalitis, frequent leukocytosis, multiple code sepsis admissions presenting with a chief complaint of abdominal pain, vomiting.  Seen at outside hospital emergency department earlier this week empirically treated for migraine but has progressively worsened.  Now most of his symptoms were abdominal pain nausea vomiting and p.o. intolerance.  Lactic acidosis.  CT scans pending, code sepsis treatment initiated per prior provider. [CC]    Clinical Course User Index [CC] Glyn Ade, MD   CRITICAL CARE Performed by: Glyn Ade  ?  Total critical care time: 30 minutes for serial pain control and sepsis.  Critical care time was exclusive of separately billable procedures and treating other patients.  Critical care was necessary to treat or prevent imminent or life-threatening deterioration.  Critical care was time spent personally by me on the following activities: development of treatment plan with patient and/or surrogate as well as nursing, discussions with consultants, evaluation of patient's response to treatment, examination of patient, obtaining history from patient or surrogate, ordering and performing treatments and interventions, ordering and review of laboratory studies, ordering and review of radiographic studies, pulse oximetry and re-evaluation of patient's condition.  Reassessment: Patient stabilized after IV fluids lactic acid downtrending.  Required frequent reassessment and extensive pain control.  CT scan only showed a small area of pneumonia.  Discussed with hospitalist and arrange for admission for sepsis.  Interesting possibility of autoimmune storm discussed with hospitalist which would be a  reasonable alternative diagnosis.    Glyn Ade, MD 05/23/23 [phone]

## 2023-05-23 NOTE — Progress Notes (Signed)
Pharmacy Antibiotic Note  Nathan Daugherty is a 32 y.o. male admitted on 05/23/2023 with concern for sepsis of unknown source. 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 On presentation Wbc 25, LA 3.8, and afebrile. Pharmacy has been consulted for cefepime and vancomycin dosing.  Plan: Cefepime 2g x 1 in ED  Vancomycin 2g load x 1 in ED  F/u additional doses on transfer  F/u micro data, clinical work-up and narrow antibiotics as able  Height: 5\' 11"  (180.3 cm) Weight: 86.2 kg (190 lb) IBW/kg (Calculated) : 75.3  Temp (24hrs), Avg:98.7 F (37.1 C), Min:98.7 F (37.1 C), Max:98.7 F (37.1 C)  Recent Labs  Lab 05/23/23 1249  WBC 25.0*  CREATININE 1.05  LATICACIDVEN 3.8*    Estimated Creatinine Clearance: 108.6 mL/min (by C-G formula based on SCr of 1.05 mg/dL).    No Known Allergies  Antimicrobials this admission: Cefepime 8/16 > Flagyl 8/16 > Vancomycin 8/16 >  Dose adjustments this admission:   Microbiology results: 8/16 resp panel: pending 8/16 Bcx: pending   Thank you for allowing pharmacy to be a part of this patient's care.  Marja Kays 05/23/2023 2:37 PM

## 2023-05-23 NOTE — ED Provider Notes (Signed)
Emergency Department Provider Note   I have reviewed the triage vital signs and the nursing notes.   HISTORY  Chief Complaint Migraine and Emesis   HPI Nathan Daugherty is a 32 y.o. male with history of seizure disorder on Vimpat and autoimmune encephalitis followed at [facility] presents emergency department with abdominal pain, vomiting, headache.  He has had he and weakness over the past 5 days.  He had an outside emergency department visit 3 days prior with leukocytosis found.  He states his symptoms were treated and he was ultimately discharged.  He states today he continued to have abdominal pain and vomiting with small-volume bright red blood with emesis.  No fever.  He does have a headache typical for him as well as epigastric abdominal pain.  His wife, at bedside, states that has had abdominal pain like this for years with no clear diagnosis. Patient reports this feels similar to his prior discomfort.   Patient tells me that with his prior episodes of seizure and encephalitis he has had some associated confusion, memory loss, which she has not experienced on this occasion.Remains complaint with his seizure medication.    Past Medical History:  Diagnosis Date   Colitis, Clostridium difficile 10/25/2016   Disease of brain    inflammatory brain disease autoimmune related   IBD (inflammatory bowel disease)    presumed Autoimmune encephalitis    Seizure-like activity (HCC)     Review of Systems  Constitutional: No fever/chills. Positive fatigue.  Eyes: No visual changes. ENT: No sore throat. Cardiovascular: Denies chest pain. Respiratory: Denies shortness of breath. Gastrointestinal: Positive epigastric abdominal pain. Positive nausea and vomiting with hematemesis.  No diarrhea.  No constipation. Genitourinary: Negative for dysuria. Musculoskeletal: Negative for back pain. Skin: Negative for rash. Neurological: Negative for focal weakness or numbness. Positive HA.    ____________________________________________   PHYSICAL EXAM:  VITAL SIGNS: ED Triage Vitals  Encounter Vitals Group     BP 05/23/23 1231 96/72     Pulse Rate 05/23/23 1231 99     Resp 05/23/23 1231 (!) 36     Temp 05/23/23 1231 98.7 F (37.1 C)     Temp src --      SpO2 05/23/23 1231 99 %     Weight 05/23/23 1232 190 lb (86.2 kg)     Height 05/23/23 1232 5\' 11"  (1.803 m)   Constitutional: Alert and oriented. Well appearing and in no acute distress. Eyes: Conjunctivae are normal.  Head: Atraumatic. Nose: No congestion/rhinnorhea. Mouth/Throat: Mucous membranes are dry.  Neck: No stridor.  No meningeal signs.  Cardiovascular: Normal rate, regular rhythm. Good peripheral circulation. Grossly normal heart sounds.   Respiratory: Normal respiratory effort.  No retractions. Lungs CTAB. Gastrointestinal: Soft with point tenderness in the epigastric region. No distention.  Musculoskeletal: No lower extremity tenderness nor edema. No gross deformities of extremities. Neurologic:  Normal speech and language. No gross focal neurologic deficits are appreciated.  Skin:  Skin is warm, dry and intact. No rash noted.  ____________________________________________   LABS (all labs ordered are listed, but only abnormal results are displayed)  Labs Reviewed  COMPREHENSIVE METABOLIC PANEL - Abnormal; Notable for the following components:      Result Value   Sodium 133 (*)    Potassium 3.4 (*)    CO2 19 (*)    Glucose, Bld 105 (*)    All other components within normal limits  LACTIC ACID, PLASMA - Abnormal; Notable for the following components:   Lactic  Acid, Venous 3.8 (*)    All other components within normal limits  CBC WITH DIFFERENTIAL/PLATELET - Abnormal; Notable for the following components:   WBC 25.0 (*)    Neutro Abs 16.5 (*)    Lymphs Abs 5.9 (*)    Monocytes Absolute 2.2 (*)    Abs Immature Granulocytes 0.14 (*)    All other components within normal limits  URINALYSIS,  W/ REFLEX TO CULTURE (INFECTION SUSPECTED) - Abnormal; Notable for the following components:   pH 8.5 (*)    Glucose, UA 100 (*)    Bacteria, UA RARE (*)    All other components within normal limits  C-REACTIVE PROTEIN - Abnormal; Notable for the following components:   CRP 1.4 (*)    All other components within normal limits  CBC WITH DIFFERENTIAL/PLATELET - Abnormal; Notable for the following components:   WBC 15.6 (*)    Neutro Abs 10.0 (*)    Monocytes Absolute 1.7 (*)    All other components within normal limits  RESP PANEL BY RT-PCR (RSV, FLU A&B, COVID)  [id number]  CULTURE, BLOOD (ROUTINE X 2)  CULTURE, BLOOD (ROUTINE X 2)  LACTIC ACID, PLASMA  PROTIME-INR  LIPASE, BLOOD  PROCALCITONIN  SEDIMENTATION RATE  LACTIC ACID, PLASMA  H. PYLORI ANTIGEN, STOOL  MAGNESIUM  PROCALCITONIN  COMPREHENSIVE METABOLIC PANEL    ____________________________________________   PROCEDURES  Procedure(s) performed:   Procedures  CRITICAL CARE Performed by: Maia Plan Total critical care time: 35 minutes Critical care time was exclusive of separately billable procedures and treating other patients. Critical care was necessary to treat or prevent imminent or life-threatening deterioration. Critical care was time spent personally by me on the following activities: development of treatment plan with patient and/or surrogate as well as nursing, discussions with consultants, evaluation of patient's response to treatment, examination of patient, obtaining history from patient or surrogate, ordering and performing treatments and interventions, ordering and review of laboratory studies, ordering and review of radiographic studies, pulse oximetry and re-evaluation of patient's condition.  Alona Bene, MD Emergency Medicine  ____________________________________________   INITIAL IMPRESSION / ASSESSMENT AND PLAN / ED COURSE  Pertinent labs & imaging results that were available during my care  of the patient were reviewed by me and considered in my medical decision making (see chart for details).   This patient is Presenting for Evaluation of abdominal pain, which does require a range of treatment options, and is a complaint that involves a high risk of morbidity and mortality.  The Differential Diagnoses includes but is not exclusive to acute cholecystitis, intrathoracic causes for epigastric abdominal pain, gastritis, duodenitis, pancreatitis, small bowel or large bowel obstruction, abdominal aortic aneurysm, hernia, gastritis, etc.   Critical Interventions-    Medications  lactated ringers infusion ( Intravenous New Bag/Given 05/24/23 0245)  0.9 %  sodium chloride infusion (0 mLs Intravenous Stopped 05/23/23 2010)  amitriptyline (ELAVIL) tablet 50 mg (50 mg Oral Not Given 05/24/23 0526)  acidophilus (RISAQUAD) capsule 1 capsule (1 capsule Oral Given 05/24/23 0530)  saccharomyces boulardii (FLORASTOR) capsule 250 mg (250 mg Oral Given 05/24/23 0530)  potassium chloride 10 mEq in 100 mL IVPB (10 mEq Intravenous New Bag/Given 05/24/23 4098)  acetaminophen (TYLENOL) tablet 650 mg (has no administration in time range)    Or  acetaminophen (TYLENOL) suppository 650 mg (has no administration in time range)  ondansetron (ZOFRAN) tablet 4 mg (has no administration in time range)    Or  ondansetron (ZOFRAN) injection 4 mg (has no administration  in time range)  sodium chloride 0.9 % bolus 1,000 mL ( Intravenous Stopped 05/23/23 1427)  pantoprazole (PROTONIX) injection 40 mg (40 mg Intravenous Given 05/23/23 1324)  ondansetron (ZOFRAN) injection 4 mg (4 mg Intravenous Given 05/23/23 1325)  morphine (PF) 4 MG/ML injection 4 mg (4 mg Intravenous Given 05/23/23 1329)  HYDROmorphone (DILAUDID) injection 1 mg (1 mg Intravenous Given 05/23/23 1421)  ceFEPIme (MAXIPIME) 2 g in sodium chloride 0.9 % 100 mL IVPB (0 g Intravenous Stopped 05/23/23 1508)  metroNIDAZOLE (FLAGYL) IVPB 500 mg (0 mg Intravenous  Stopped 05/23/23 1557)  vancomycin (VANCOCIN) IVPB 1000 mg/200 mL premix (0 mg Intravenous Stopped 05/23/23 2009)  vancomycin (VANCOCIN) IVPB 1000 mg/200 mL premix (0 mg Intravenous Stopped 05/23/23 1837)  sodium chloride 0.9 % bolus 1,000 mL (0 mLs Intravenous Stopped 05/23/23 2131)  sodium chloride 0.9 % bolus 500 mL ( Intravenous Stopped 05/23/23 1532)  iohexol (OMNIPAQUE) 300 MG/ML solution 100 mL (100 mLs Intravenous Contrast Given 05/23/23 1635)  HYDROmorphone (DILAUDID) injection 1 mg (1 mg Intravenous Given 05/24/23 0508)    Reassessment after intervention:  symptoms improved.    I did obtain Additional Historical Information from wife at bedside.  I decided to review pertinent External Data, and in summary patient followed by Neurology at [facility].   Clinical Laboratory Tests Ordered, included CBC with leukocytosis to 25. No anemia. No AKI. Lactic acid > 3.   Radiologic Tests Ordered, included CXR. I independently interpreted the images and agree with radiology interpretation.   Cardiac Monitor Tracing which shows NSR.    Social Determinants of Health Risk patient does not smoke.   Medical Decision Making: Summary:  Patient presents emergency department for evaluation of abdominal pain and vomiting along with headache.  Arrives afebrile with no SIRS vitals.  Does have a history of chronic abdominal pain which may be playing a role here.  No clear description of recent seizures.  He does not appear particularly confused to suspect return of his encephalitis.   Reevaluation with update and discussion with patient. Plan for CT imaging and [facility] admit. Care transferred to Dr. Doran Durand.   Patient's presentation is most consistent with acute presentation with potential threat to life or bodily function.   Disposition: admit  ____________________________________________  FINAL CLINICAL IMPRESSION(S) / ED DIAGNOSES  Final diagnoses:  Sepsis, due to unspecified organism, unspecified  whether acute organ dysfunction present [facility])    Note:  This document was prepared using Dragon voice recognition software and may include unintentional dictation errors.  Alona Bene, MD, [facility] Emergency Medicine    Carmalita Wakefield, Arlyss Repress, MD 05/24/23 [phone]

## 2023-05-23 NOTE — ED Notes (Signed)
Patient transported to CT 

## 2023-05-23 NOTE — Progress Notes (Signed)
ED Pharmacy Antibiotic Sign Off An antibiotic consult was received from an ED provider for vancomycin and cefepime per pharmacy dosing for sepsis. A chart review was completed to assess appropriateness.   The following one time order(s) were placed:  Vancomycin 2g IV x 1 Cefepime 2g IV x 1  Further antibiotic and/or antibiotic pharmacy consults should be ordered by the admitting provider if indicated.   Thank you for allowing pharmacy to be a part of this patient's care.   Daylene Posey, [facility]  Clinical Pharmacist 05/23/23 4:33 PM

## 2023-05-23 NOTE — ED Triage Notes (Signed)
The patient has been having a migraine since Monday. He has had ABD and vomiting since then. He stated he was seen at [place] ED this week and his WBC was 17.9. He stated he is feeling worse.

## 2023-05-24 ENCOUNTER — Encounter (HOSPITAL_COMMUNITY): Payer: Self-pay | Admitting: Internal Medicine

## 2023-05-24 DIAGNOSIS — D72829 Elevated white blood cell count, unspecified: Secondary | ICD-10-CM

## 2023-05-24 DIAGNOSIS — Z87891 Personal history of nicotine dependence: Secondary | ICD-10-CM | POA: Diagnosis not present

## 2023-05-24 DIAGNOSIS — G8929 Other chronic pain: Secondary | ICD-10-CM | POA: Diagnosis not present

## 2023-05-24 DIAGNOSIS — R1084 Generalized abdominal pain: Secondary | ICD-10-CM | POA: Diagnosis not present

## 2023-05-24 DIAGNOSIS — Z1152 Encounter for screening for COVID-19: Secondary | ICD-10-CM | POA: Diagnosis not present

## 2023-05-24 DIAGNOSIS — R109 Unspecified abdominal pain: Secondary | ICD-10-CM

## 2023-05-24 DIAGNOSIS — F418 Other specified anxiety disorders: Secondary | ICD-10-CM

## 2023-05-24 DIAGNOSIS — R6510 Systemic inflammatory response syndrome (SIRS) of non-infectious origin without acute organ dysfunction: Secondary | ICD-10-CM | POA: Diagnosis not present

## 2023-05-24 DIAGNOSIS — R6511 Systemic inflammatory response syndrome (SIRS) of non-infectious origin with acute organ dysfunction: Secondary | ICD-10-CM

## 2023-05-24 DIAGNOSIS — K588 Other irritable bowel syndrome: Secondary | ICD-10-CM

## 2023-05-24 DIAGNOSIS — G0481 Other encephalitis and encephalomyelitis: Secondary | ICD-10-CM

## 2023-05-24 DIAGNOSIS — G43909 Migraine, unspecified, not intractable, without status migrainosus: Secondary | ICD-10-CM | POA: Diagnosis not present

## 2023-05-24 DIAGNOSIS — E876 Hypokalemia: Secondary | ICD-10-CM | POA: Diagnosis not present

## 2023-05-24 DIAGNOSIS — R1110 Vomiting, unspecified: Secondary | ICD-10-CM | POA: Diagnosis not present

## 2023-05-24 LAB — COMPREHENSIVE METABOLIC PANEL
ALT: 14 U/L (ref 0–44)
AST: 15 U/L (ref 15–41)
Albumin: 3.3 g/dL — ABNORMAL LOW (ref 3.5–5.0)
Alkaline Phosphatase: 61 U/L (ref 38–126)
Anion gap: 5 (ref 5–15)
BUN: 8 mg/dL (ref 6–20)
CO2: 24 mmol/L (ref 22–32)
Calcium: 8.6 mg/dL — ABNORMAL LOW (ref 8.9–10.3)
Chloride: 104 mmol/L (ref 98–111)
Creatinine, Ser: 0.88 mg/dL (ref 0.61–1.24)
GFR, Estimated: >60 mL/min (ref >60)
Glucose, Bld: 98 mg/dL (ref 70–99)
Potassium: 4.1 mmol/L (ref 3.5–5.1)
Sodium: 133 mmol/L — ABNORMAL LOW (ref 135–145)
Total Bilirubin: 0.8 mg/dL (ref 0.3–1.2)
Total Protein: 6 g/dL — ABNORMAL LOW (ref 6.5–8.1)

## 2023-05-24 LAB — CBC WITH DIFFERENTIAL/PLATELET
Abs Immature Granulocytes: 0.06 10*3/uL (ref 0.00–0.07)
Basophils Absolute: 0.1 10*3/uL (ref 0.0–0.1)
Basophils Relative: 0 %
Eosinophils Absolute: 0.1 10*3/uL (ref 0.0–0.5)
Eosinophils Relative: 1 %
HCT: 40.3 % (ref 39.0–52.0)
Hemoglobin: 13.8 g/dL (ref 13.0–17.0)
Immature Granulocytes: 0 %
Lymphocytes Relative: 24 %
Lymphs Abs: 3.7 10*3/uL (ref 0.7–4.0)
MCH: 28.8 pg (ref 26.0–34.0)
MCHC: 34.2 g/dL (ref 30.0–36.0)
MCV: 84.1 fL (ref 80.0–100.0)
Monocytes Absolute: 1.7 10*3/uL — ABNORMAL HIGH (ref 0.1–1.0)
Monocytes Relative: 11 %
Neutro Abs: 10 10*3/uL — ABNORMAL HIGH (ref 1.7–7.7)
Neutrophils Relative %: 64 %
Platelets: 250 10*3/uL (ref 150–400)
RBC: 4.79 MIL/uL (ref 4.22–5.81)
RDW: 12 % (ref 11.5–15.5)
WBC: 15.6 10*3/uL — ABNORMAL HIGH (ref 4.0–10.5)
nRBC: 0 % (ref 0.0–0.2)

## 2023-05-24 LAB — PROCALCITONIN: Procalcitonin: <0.1 ng/mL

## 2023-05-24 LAB — MAGNESIUM: Magnesium: 1.9 mg/dL (ref 1.7–2.4)

## 2023-05-24 MED ORDER — ONDANSETRON HCL 4 MG PO TABS
4.0000 mg | ORAL_TABLET | Freq: Four times a day (QID) | ORAL | Status: DC | PRN
  Administered 2023-05-24: 4 mg via ORAL
  Filled 2023-05-24: qty 1

## 2023-05-24 MED ORDER — SACCHAROMYCES BOULARDII 250 MG PO CAPS
250.0000 mg | ORAL_CAPSULE | Freq: Two times a day (BID) | ORAL | Status: DC
  Administered 2023-05-24 – 2023-05-25 (×3): 250 mg via ORAL
  Filled 2023-05-24 (×3): qty 1

## 2023-05-24 MED ORDER — POLYETHYLENE GLYCOL 3350 17 G PO PACK
17.0000 g | PACK | Freq: Two times a day (BID) | ORAL | Status: DC
  Administered 2023-05-24: 17 g via ORAL
  Filled 2023-05-24 (×2): qty 1

## 2023-05-24 MED ORDER — HYDROMORPHONE HCL 1 MG/ML IJ SOLN
1.0000 mg | Freq: Once | INTRAMUSCULAR | Status: AC
  Administered 2023-05-24: 1 mg via INTRAVENOUS
  Filled 2023-05-24: qty 1

## 2023-05-24 MED ORDER — ACETAMINOPHEN 650 MG RE SUPP: 650.0000 mg | Freq: Four times a day (QID) | RECTAL | Status: DC | PRN

## 2023-05-24 MED ORDER — ENOXAPARIN SODIUM 40 MG/0.4ML IJ SOSY
40.0000 mg | PREFILLED_SYRINGE | INTRAMUSCULAR | Status: DC
  Filled 2023-05-24: qty 0.4

## 2023-05-24 MED ORDER — RISAQUAD PO CAPS
1.0000 | ORAL_CAPSULE | Freq: Two times a day (BID) | ORAL | Status: DC
  Administered 2023-05-24 – 2023-05-25 (×3): 1 via ORAL
  Filled 2023-05-24 (×3): qty 1

## 2023-05-24 MED ORDER — HYDROMORPHONE HCL 1 MG/ML IJ SOLN
1.0000 mg | INTRAMUSCULAR | Status: AC | PRN
  Administered 2023-05-24 – 2023-05-25 (×4): 1 mg via INTRAVENOUS
  Filled 2023-05-24 (×4): qty 1

## 2023-05-24 MED ORDER — PANTOPRAZOLE SODIUM 40 MG PO TBEC
40.0000 mg | DELAYED_RELEASE_TABLET | Freq: Every day | ORAL | Status: DC
  Administered 2023-05-24: 40 mg via ORAL
  Filled 2023-05-24: qty 1

## 2023-05-24 MED ORDER — POTASSIUM CHLORIDE 10 MEQ/100ML IV SOLN
10.0000 meq | INTRAVENOUS | Status: AC
  Administered 2023-05-24 (×3): 10 meq via INTRAVENOUS
  Filled 2023-05-24 (×3): qty 100

## 2023-05-24 MED ORDER — ACETAMINOPHEN 325 MG PO TABS: 650.0000 mg | ORAL_TABLET | Freq: Four times a day (QID) | ORAL | Status: DC | PRN

## 2023-05-24 MED ORDER — ONDANSETRON HCL 4 MG/2ML IJ SOLN: 4.0000 mg | Freq: Four times a day (QID) | INTRAMUSCULAR | Status: DC | PRN

## 2023-05-24 MED ORDER — AMITRIPTYLINE HCL 50 MG PO TABS
50.0000 mg | ORAL_TABLET | Freq: Every day | ORAL | Status: DC
  Filled 2023-05-24: qty 1

## 2023-05-24 MED ORDER — OXYCODONE HCL 5 MG PO TABS
5.0000 mg | ORAL_TABLET | Freq: Four times a day (QID) | ORAL | Status: DC | PRN
  Administered 2023-05-24 – 2023-05-25 (×4): 10 mg via ORAL
  Filled 2023-05-24 (×4): qty 2

## 2023-05-24 MED ORDER — KETOROLAC TROMETHAMINE 30 MG/ML IJ SOLN
30.0000 mg | Freq: Three times a day (TID) | INTRAMUSCULAR | Status: DC | PRN
  Administered 2023-05-24: 30 mg via INTRAVENOUS
  Filled 2023-05-24: qty 1

## 2023-05-24 MED ORDER — SENNOSIDES-DOCUSATE SODIUM 8.6-50 MG PO TABS
2.0000 | ORAL_TABLET | Freq: Two times a day (BID) | ORAL | Status: DC
  Administered 2023-05-24 – 2023-05-25 (×2): 2 via ORAL
  Filled 2023-05-24 (×2): qty 2

## 2023-05-24 MED ORDER — ALUM & MAG HYDROXIDE-SIMETH 200-200-20 MG/5ML PO SUSP: 30.0000 mL | Freq: Four times a day (QID) | ORAL | Status: DC | PRN

## 2023-05-24 NOTE — ED Notes (Signed)
[facility] called for transport. 

## 2023-05-24 NOTE — Subjective & Objective (Signed)
CC: abd pain, headaches HPI: 32 year old gentleman with a long history of chronic abdominal pain/IBS with a recent 2-year history of neurologic problems including the possibility of seronegative autoimmune encephalitis presents to the ER today with continued abdominal pain over the last 7 days.  During this bout of abdominal pain, he had about 3 days of abdominal pain before presenting to [facility] on 05/20/2023.  He also had a intractable headache.  He was given 125 mg of IV Solu-Medrol fluids and some Compazine.  He was discharged to home.  He states that he really did not feel a whole lot better.  Undergone extensive evaluation both at [facility] and at [facility] for enhancement of his leptomeninges seen on MRI.  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    His autoimmune encephalitis panel has been negative. ANA is negative. ANCA profile is negative. His extractable nuclear antigen panel including SSA, SSB, Smith, RNP, SCL-70, Jo-1 are all negative.  He also had peripheral flow cytometry performed at [facility] which was also negative.  He is followed up at [facility] and had another repeat LP performed along with a brain MRI.  His brain MRI showed no further enhancement of his leptomeninges.  He came back to the ER this evening now at [facility] with similar complaints of abdominal pain, vomiting with flecks of blood in his vomit, and a headache.  On arrival temp 98.7 heart rate 99 blood pressure 96/72 with 99% saturations on room air.  Sodium 133, potassium 3.4, chloride of 101, bicarb of 19, BUN of 13, creatinine 1.05, glucose 105  Lipase of 34  White count of 25, hemoglobin 16.5, platelets of 342, 66% neutrophils, 24% lymphocytes  Please note that his CBC from 05/20/2023 showed a white count of 16.9, hemoglobin of 15.6, platelets of 311 with 63% neutrophils, 27% lymphocytes.  He also received 125 mg of  Solu-Medrol during this ER visit.  His initial lactic acid was 3.8.  He received 1.5 L of IV fluids and his lactic acidosis quickly cleared.  Repeat lactic acid was 0.9.  UA showed specific gravity 1.020, pH of 8.5, glucose 100 otherwise negative  COVID was negative, influenza negative, RSV negative.  Sed rate was 3, procalcitonin less than 0.10, CRP slightly elevated at 1.4  CT head showed no acute intracranial process.  CT chest abdomen pelvis showed a small patch of subtle nodular opacity in the inferior right upper lobe.  Patient given broad-spectrum antibiotics including cefepime, Flagyl and vancomycin.  He received multiple rounds of IV morphine and Dilaudid.  He repeatedly requested more morphine.  He states that only IV Dilaudid helps with his abdominal pain.  [facility] hospitalist consulted.

## 2023-05-24 NOTE — Assessment & Plan Note (Addendum)
Observation bed. Pt has SIRS, not sepsis. Procalcitonin is negative. ESR is normal. Slight elevated of CRP. Pt received 125 mg of IV solumedrol about 72 hours ago. This can explain his increase in his leukocytosis. I have personally reviewed pt's CT chest images and he does NOT have pneumonia. IV abx are not indicated. His lactic acidosis quickly cleared in 1 hour after 1.5 liters of fluid. Will continue with IVF overnight and then stop them.

## 2023-05-24 NOTE — H&P (Signed)
History and Physical    Nathan Daugherty NWG:[id number] DOB: [birth year] DOA: 05/23/2023  DOS: the patient was seen and examined on 05/23/2023  PCP: Tish Frederickson, MD   Patient coming from: Home  I have personally briefly reviewed patient's old medical records in [facility]  CC: abd pain, headaches HPI: 32 year old gentleman with a long history of chronic abdominal pain/IBS with a recent 2-year history of neurologic problems including the possibility of seronegative autoimmune encephalitis presents to the ER today with continued abdominal pain over the last 7 days.  During this bout of abdominal pain, he had about 3 days of abdominal pain before presenting to [facility] on 05/20/2023.  He also had a intractable headache.  He was given 125 mg of IV Solu-Medrol fluids and some Compazine.  He was discharged to home.  He states that he really did not feel a whole lot better.  Undergone extensive evaluation both at [facility] and at [facility] for enhancement of his leptomeninges seen on MRI.  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    His autoimmune encephalitis panel has been negative. ANA is negative. ANCA profile is negative. His extractable nuclear antigen panel including SSA, SSB, Smith, RNP, SCL-70, Jo-1 are all negative.  He also had peripheral flow cytometry performed at [facility] which was also negative.  He is followed up at [facility] and had another repeat LP performed along with a brain MRI.  His brain MRI showed no further enhancement of his leptomeninges.  He came back to the ER this evening now at [facility] with similar complaints of abdominal pain, vomiting with flecks of blood in his vomit, and a headache.  On arrival temp 98.7 heart rate 99 blood pressure 96/72 with 99% saturations on room air.  Sodium 133, potassium 3.4, chloride of 101, bicarb of 19, BUN of 13, creatinine 1.05,  glucose 105  Lipase of 34  White count of 25, hemoglobin 16.5, platelets of 342, 66% neutrophils, 24% lymphocytes  Please note that his CBC from 05/20/2023 showed a white count of 16.9, hemoglobin of 15.6, platelets of 311 with 63% neutrophils, 27% lymphocytes.  He also received 125 mg of Solu-Medrol during this ER visit.  His initial lactic acid was 3.8.  He received 1.5 L of IV fluids and his lactic acidosis quickly cleared.  Repeat lactic acid was 0.9.  UA showed specific gravity 1.020, pH of 8.5, glucose 100 otherwise negative  COVID was negative, influenza negative, RSV negative.  Sed rate was 3, procalcitonin less than 0.10, CRP slightly elevated at 1.4  CT head showed no acute intracranial process.  CT chest abdomen pelvis showed a small patch of subtle nodular opacity in the inferior right upper lobe.  Patient given broad-spectrum antibiotics including cefepime, Flagyl and vancomycin.  He received multiple rounds of IV morphine and Dilaudid.  He repeatedly requested more morphine.  He states that only IV Dilaudid helps with his abdominal pain.  [facility] hospitalist consulted.   ED Course: CT head negative. CT chest/abd/pelvis small patchy density RUL. WBC 25  Review of Systems:  Review of Systems  Constitutional:  Positive for malaise/fatigue. Negative for fever.  HENT: Negative.    Eyes: Negative.   Respiratory: Negative.    Cardiovascular: Negative.   Gastrointestinal:  Positive for abdominal pain, nausea and vomiting. Negative for blood in stool and melena.  Genitourinary: Negative.   Musculoskeletal: Negative.   Skin: Negative.   Neurological:  Positive for dizziness and headaches.  Endo/Heme/Allergies: Negative.   Psychiatric/Behavioral: Negative.    All other systems reviewed and are negative.   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 His smoking use included cigarettes. He has never used smokeless tobacco. He reports that he does not currently use alcohol. He reports that he does not use drugs.  No Known Allergies  Family History  Problem Relation Age of Onset   Breast cancer Mother    Diabetes Mellitus II Maternal Grandmother    Breast cancer Maternal Grandmother    Lung cancer Maternal Grandfather     Prior to Admission medications   Medication Sig Start Date End Date Taking? Authorizing Provider  escitalopram (LEXAPRO) 10 MG tablet Take 10 mg by mouth as needed (anxiety).   Yes [provider]  Lacosamide (VIMPAT) 100 MG TABS Take 100 mg by mouth as needed (seizures).   Yes [provider]  dicyclomine (BENTYL) 20 MG tablet Take 1 tablet (20 mg total) by mouth 2 (two) times daily as needed for spasms. Patient not taking: Reported on 05/23/2023 01/09/23   Sherian Maroon A, PA  ondansetron (ZOFRAN) 4 MG tablet Take 1 tablet (4 mg total) by mouth every 6 (six) hours as needed for nausea or vomiting. Patient not taking: Reported on 05/23/2023 01/09/23   Sherian Maroon A, PA  oxyCODONE (ROXICODONE) 5 MG immediate release tablet Take 1 tablet (5 mg total) by mouth every 6 (six) hours as needed for moderate pain. Patient not taking: Reported on 05/23/2023 01/18/23 01/18/24  [place], Ulisses Vondrak J, [place]    Physical Exam: Vitals:   05/24/23 0015 05/24/23 0125 05/24/23 0239 05/24/23 0342  BP: (!) 105/55 (!) 102/59 119/77 113/69  Pulse: 66 66 73 68  Resp: 18 18 15 20   Temp:  99.1 F (37.3 C)    TempSrc:  Oral    SpO2: 93% 96% 94% 95%  Weight:      Height:        Physical Exam Vitals reviewed.  Constitutional:      General: He is not in acute distress.    Appearance: He is normal weight. He  is not toxic-appearing or diaphoretic.  HENT:     Head: Normocephalic and atraumatic.  Eyes:     General: No scleral icterus. Cardiovascular:     Rate and Rhythm: Normal rate and regular rhythm.     Pulses: Normal pulses.  Pulmonary:     Effort: Pulmonary effort is normal. No respiratory distress.     Breath sounds: Normal breath sounds. No wheezing.  Abdominal:     General: Bowel sounds are normal. There is no distension.     Tenderness: There is generalized abdominal tenderness. There is no rebound.  Musculoskeletal:     Right lower leg: No edema.     Left lower leg: No edema.  Skin:    General: Skin is warm and dry.     Capillary Refill: Capillary refill takes less than 2 seconds.  Neurological:     General: No focal deficit present.     Mental Status: He is alert and oriented to person, place,  and time.      Labs on Admission: I have personally reviewed following labs and imaging studies  CBC: Recent Labs  Lab 05/23/23 1249  WBC 25.0*  NEUTROABS 16.5*  HGB 16.5  HCT 46.7  MCV 82.8  PLT 342   Basic Metabolic Panel: Recent Labs  Lab 05/23/23 1249  NA 133*  K 3.4*  CL 101  CO2 19*  GLUCOSE 105*  BUN 13  CREATININE 1.05  CALCIUM 9.7   GFR: Estimated Creatinine Clearance: 108.6 mL/min (by C-G formula based on SCr of 1.05 mg/dL). Liver Function Tests: Recent Labs  Lab 05/23/23 1249  AST 22  ALT 16  ALKPHOS 79  BILITOT 0.5  PROT 7.8  ALBUMIN 4.7   Recent Labs  Lab 05/23/23 1249  LIPASE 37   Coagulation Profile: Recent Labs  Lab 05/23/23 1249  INR 1.0   Urine analysis:    Component Value Date/Time   COLORURINE YELLOW 05/23/2023 1249   APPEARANCEUR CLEAR 05/23/2023 1249   LABSPEC 1.020 05/23/2023 1249   PHURINE 8.5 (H) 05/23/2023 1249   GLUCOSEU 100 (A) 05/23/2023 1249   HGBUR NEGATIVE 05/23/2023 1249   BILIRUBINUR NEGATIVE 05/23/2023 1249   KETONESUR NEGATIVE 05/23/2023 1249   PROTEINUR NEGATIVE 05/23/2023 1249   UROBILINOGEN 0.2  12/09/2013 0330   NITRITE NEGATIVE 05/23/2023 1249   LEUKOCYTESUR NEGATIVE 05/23/2023 1249    Radiological Exams on Admission: I have personally reviewed images CT Head Wo Contrast  Result Date: 05/23/2023 CLINICAL DATA:  Headaches EXAM: CT HEAD WITHOUT CONTRAST TECHNIQUE: Contiguous axial images were obtained from the base of the skull through the vertex without intravenous contrast. RADIATION DOSE REDUCTION: This exam was performed according to the departmental dose-optimization program which includes automated exposure control, adjustment of the mA and/or kV according to patient size and/or use of iterative reconstruction technique. COMPARISON:  05/15/2022 FINDINGS: Brain: No evidence of acute infarction, hemorrhage, hydrocephalus, extra-axial collection or mass lesion/mass effect. Vascular: No hyperdense vessel or unexpected calcification. Skull: No osseous abnormality. Sinuses/Orbits: Visualized paranasal sinuses are clear. Visualized mastoid sinuses are clear. Visualized orbits demonstrate no focal abnormality. Other: None IMPRESSION: 1. No acute intracranial findings. Electronically Signed   By: Elige Ko M.D.   On: 05/23/2023 17:17   CT CHEST ABDOMEN PELVIS W CONTRAST  Result Date: 05/23/2023 CLINICAL DATA:  Headache. Vomiting with elevated white blood cell count. Progressive symptoms. EXAM: CT CHEST, ABDOMEN, AND PELVIS WITH CONTRAST TECHNIQUE: Multidetector CT imaging of the chest, abdomen and pelvis was performed following the standard protocol during bolus administration of intravenous contrast. RADIATION DOSE REDUCTION: This exam was performed according to the departmental dose-optimization program which includes automated exposure control, adjustment of the mA and/or kV according to patient size and/or use of iterative reconstruction technique. CONTRAST:  OMNIPAQUE IOHEXOL 300 MG/ML  SOLN COMPARISON:  Chest x-ray earlier 05/23/2023. Abdomen pelvis CT 01/16/2023. Older exams as  well. FINDINGS: CT CHEST FINDINGS Cardiovascular: Trace pericardial fluid. The heart is nonenlarged. The thoracic aorta has a normal course and caliber. Pulsation artifact along the ascending aorta. No mediastinal hematoma. Mediastinum/Nodes: Slightly patulous thoracic esophagus. Preserved thyroid gland. There is some triangular-shaped soft tissue in the anterior superior mediastinum which could relate to residual thymus. No specific abnormal lymph node enlargement identified in the axillary regions, hilum or mediastinum Lungs/Pleura: There is some linear opacity lung bases likely scar or atelectasis. No pleural effusion. There is some patchy parenchymal opacity along the posterior right upper lobe with some interstitial components, ground-glass and nodular areas. Example  the largest nodular area on series 4 image 62 measures 9 mm again this has a differential including an infiltrative process. Musculoskeletal: Mild degenerative changes. There is posterior disc bulge and osteophytes at T12-L1 with flattening of the ventral aspect of the thecal sac mildly CT ABDOMEN PELVIS FINDINGS Hepatobiliary: Mild fatty liver infiltration. No space-occupying liver lesion. Patent portal vein. The gallbladder is nondilated. Pancreas: Unremarkable. No pancreatic ductal dilatation or surrounding inflammatory changes. Spleen: Normal in size without focal abnormality. Adrenals/Urinary Tract: The adrenal glands are preserved. No enhancing renal mass or collecting system dilatation. The ureters have normal course and caliber extending down to the bladder. Preserved contours of the urinary bladder. Stomach/Bowel: There is some fluid in the stomach. Stomach is nondilated. On this non oral contrast exam, the small bowel is nondilated. Large bowel is of normal course and caliber with scattered colonic stool. Normal appendix in the right lower quadrant Vascular/Lymphatic: No significant vascular findings are present. No enlarged abdominal or  pelvic lymph nodes. Reproductive: Prostate is unremarkable. Other: No abdominal wall hernia or abnormality. No abdominopelvic ascites. Musculoskeletal: Small osteophytes seen along the sacroiliac joints. There is left-sided transitional lumbosacral segment. IMPRESSION: Subtle patchy nodular infiltrative type opacity in the inferior right upper lobe. This has a differential but infection is one possibility recommend short follow-up. No effusion. No bowel obstruction, free air or free fluid. Normal appendix. Scattered stool. Fatty liver infiltration Electronically Signed   By: Karen Kays M.D.   On: 05/23/2023 17:13   DG Chest Port 1 View  Result Date: 05/23/2023 CLINICAL DATA:  Hematemesis and abdominal pain EXAM: PORTABLE CHEST 1 VIEW COMPARISON:  Chest radiograph dated 01/16/2023 FINDINGS: Normal lung volumes. No focal consolidations. No pleural effusion or pneumothorax. The heart size and mediastinal contours are within normal limits. No acute osseous abnormality. IMPRESSION: No active disease. Electronically Signed   By: Agustin Cree M.D.   On: 05/23/2023 15:16    EKG: My personal interpretation of EKG shows: no EKG to review.  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 Pt has SIRS, not sepsis. Procalcitonin is negative. ESR is normal. Slight elevated of CRP. Pt received 125 mg of IV solumedrol about 72 hours ago. This can explain his increase in his leukocytosis. I have personally reviewed pt's CT chest images and he does NOT have pneumonia. IV abx are not indicated. His lactic acidosis quickly cleared in 1 hour after 1.5 liters of fluid. Will  continue with IVF overnight and then stop them.  Chronic abdominal pain Pt has had chronic abd pain for about 12 years. Waxes and wanes over the years. Most recently has gotten worse with this headaches. Pt and wife states they saw GI specialist while he was at [facility]. DC summary reviewed.  It was thought pt had viral gastroenteritis by GI specialist. Pt on lexapro for depression/anxiety. Not sure if IBS with diarrhea predominance has been entertained. Discussed with him about adding Elavil at night to help. He is agreeable. Also discussed about adding Cymbalta to his regimen and stopping lexapro. He said he will talk with is neurologist. Will give 1 dose of IV dilaudid but pt and wife informed no further  opiates would be prescribed for chronic abd pain. Start probiotic. I really think pt should f/u with [facility] to consider GI neurologic axis as a source for his chronic abdominal pain. His Elavil dose can be uptitrated then. Again, opiate medications should not be prescribed for chronic abd pain. For completeness sake, check H. Pylori ab. It would be a shame to miss such an easy diagnosis for his chronic abd pain.  Porphyria was also evaluated at [facility] and was ruled out porphyrin labs.  Other irritable bowel syndrome I think the patient has irritable bowel syndrome.  He was complaining of diarrhea when he was at [facility] several months ago.  I think he has IBS with diarrhea predominance.  Hopefully Elavil will help with this.  Leukocytosis Patient does not have sepsis.  I do not think he has an infection.  Patient did receive a large dose of Solu-Medrol 125 mg 3 days ago.  I think this can explain his elevated white count.  His procalcitonin is negative along with sed rate.  His CRP is slightly elevated at 1.4.  Do not think that further antibiotics are indicated.  Depression with anxiety Patient suffers from anxiety and depression.  He is on very low doses of Lexapro.  He only takes it as needed.  Replace of  Lexapro with Elavil 50 mg at bedtime to see if this helps with both his anxiety/depression and his chronic abdominal pain which I think is probably IBS.  Migraines Patient only taking Vimpat as needed.  He last had it about 2 days ago.  Patient and wife state that he had a seizure many years ago.  He has not been taking his Vimpat consistently.  I question whether or not some of his headaches and abdominal pain may be either complex migraines or abdominal migraines.  He states that he sees [facility].  He needs to follow-up with [facility] about his migraines.  He is not taking any medications for prophylaxis including propranolol.  He may benefit from prophylactic migraine medications.  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 Pt does NOT have a proven diagnosis of seronegative autoimmune encephalitis. This would be a diagnosis of exclusion. Pt's last brain MRI in Oct 2023 has shown resolution of his meningeal enhancement. [facility] is attempting to get approval for pt to start Rituxan therapy.  I do think the patient is suffering from some sort of neurologic process.  I think the stress of his abdominal pain adds to his headaches.  Hopefully [facility] can figure out what exactly is going on with him.  This is beyond the scope of the hospitalist service here at [facility].  In the future, if he needs further inpatient care regarding his headache/encephalitis, he should be transferred to [facility].   DVT prophylaxis: SCDs Code Status: Full Code Family Communication: discussed with pt and his wife Brittney at bedside Disposition Plan: return home  Consults called: none  Admission status: Observation, Telemetry bed   Carollee Herter, DO [facility] 05/24/2023, 4:46 AM

## 2023-05-24 NOTE — Assessment & Plan Note (Signed)
Patient does not have sepsis.  I do not think he has an infection.  Patient did receive a large dose of Solu-Medrol 125 mg 3 days ago.  I think this can explain his elevated white count.  His procalcitonin is negative along with sed rate.  His CRP is slightly elevated at 1.4.  Do not think that further antibiotics are indicated.

## 2023-05-24 NOTE — Assessment & Plan Note (Signed)
I think the patient has irritable bowel syndrome.  He was complaining of diarrhea when he was at [facility] several months ago.  I think he has IBS with diarrhea predominance.  Hopefully Elavil will help with this.

## 2023-05-24 NOTE — Assessment & Plan Note (Signed)
Patient suffers from anxiety and depression.  He is on very low doses of Lexapro.  He only takes it as needed.  Replace of Lexapro with Elavil 50 mg at bedtime to see if this helps with both his anxiety/depression and his chronic abdominal pain which I think is probably IBS.

## 2023-05-24 NOTE — Assessment & Plan Note (Addendum)
Pt sees [facility]. Pt does NOT have a proven diagnosis of seronegative autoimmune encephalitis. This would be a diagnosis of exclusion. Pt's last brain MRI in Oct 2023 has shown resolution of his meningeal enhancement. [facility] is attempting to get approval for pt to start Rituxan therapy.  I do think the patient is suffering from some sort of neurologic process.  I think the stress of his abdominal pain adds to his headaches.  Hopefully [facility] can figure out what exactly is going on with him.  This is beyond the scope of the hospitalist service here at [facility].  In the future, if he needs further inpatient care regarding his headache/encephalitis, he should be transferred to [facility].

## 2023-05-24 NOTE — Plan of Care (Signed)
  Problem: Education: Goal: Knowledge of General Education information will improve Description: Including pain rating scale, medication(s)/side effects and non-pharmacologic comfort measures Outcome: Progressing   Problem: Clinical Measurements: Goal: Will remain free from infection Outcome: Progressing Goal: Diagnostic test results will improve Outcome: Progressing   Problem: Nutrition: Goal: Adequate nutrition will be maintained Outcome: Progressing   Problem: Safety: Goal: Ability to remain free from injury will improve Outcome: Progressing

## 2023-05-24 NOTE — Progress Notes (Signed)
PROGRESS NOTE        PATIENT DETAILS Name: Nathan Daugherty Age: 32 y.o. Sex: male Date of Birth: 26-Aug-1991 Admit Date: 05/23/2023 Admitting Physician Carollee Herter, DO MWN:[id number], Haydee Salter, MD  Brief Summary: Patient is a 32 y.o.  male with a history of seronegative encephalitis, chronic abdominal pain of unknown etiology, seizure disorder who presented flare of his underlying chronic abdominal pain-in the setting of runny nose/cough/vomiting (?  Viral prodrome)  Significant events: 8/16>> admit to [facility]  Significant studies: 8/16>> CT chest/abdomen/pelvis: Nodular infiltrative opacity in the inferior right upper lobe-no bowel obstruction/free air/free fluid.  Some scattered stool. 8/16>> CT head: No acute intracranial findings.  Significant microbiology data: 8/16>> COVID/influenza/RSV PCR: Negative 8/16>> blood culture: No growth  Procedures: None  Consults: None  Subjective: Continues to have mid abdominal pain-no vomiting overnight.  No diarrhea.  Last BM apparently was yesterday.  Objective: Vitals: Blood pressure 107/65, pulse (!) 56, temperature 98.3 F (36.8 C), temperature source Oral, resp. rate 12, height 5\' 11"  (1.803 m), weight 86.2 kg, SpO2 95%.   Exam: Gen Exam:Alert awake-not in any distress HEENT:atraumatic, normocephalic Chest: B/L clear to auscultation anteriorly CVS:S1S2 regular Abdomen: Soft-somewhat tender in mid abdominal area without any peritoneal signs. Extremities:no edema Neurology: Non focal Skin: no rash  Pertinent Labs/Radiology:    Latest Ref Rng & Units 05/24/2023    6:25 AM 05/23/2023   12:49 PM 01/18/2023    3:38 AM  CBC  WBC 4.0 - 10.5 K/uL 15.6  25.0  9.9   Hemoglobin 13.0 - 17.0 g/dL 66.4  40.3  47.4   Hematocrit 39.0 - 52.0 % 40.3  46.7  40.9   Platelets 150 - 400 K/uL 250  342  285     Lab Results  Component Value Date   NA 133 (L) 05/24/2023   K 4.1 05/24/2023   CL 104 05/24/2023   CO2 24  05/24/2023      Assessment/Plan: Acute on chronic abdominal pain Per patient history-he gets these flares when he has what sounds like a viral syndrome/infection Appears somewhat uncomfortable but not in any distress Oral narcotics apparently did not work when he has these flares-Per history-only IV narcotics work-specifically Dilaudid. Will  start some IV Dilaudid to try and get him somewhat comfortable-and continue with current diet orders Bowel regimen with MiraLAX/senna CT abdomen negative for any major abnormalities Abdominal exam without any major concerning findings Plan is to provide supportive care-serial abdominal exams-and if he stabilizes-hopefully home tomorrow morning.  Please see detailed H&P by Dr. Lise Auer had extensive workup in the past including serological workup-all negative-unknown etiology of chronic abdominal pain.  Apparently has follow-up with [facility] after he starts Rituxan for his seronegative encephalitis.  ?  Viral prodrome Some vomiting/runny nose/intermittent headaches for the past several days prior to this hospitalization No concerning findings on exam-CT imaging negative for any major infection-all cultures negative so far Leukocytosis is downtrending Does not require any antibiotics-continue to monitor off antibiotics Repeat CBC tomorrow morning and follow cultures  Depression/anxiety Has been switched to Elavil by admitting MD  Reported history of seizure disorder Apparently he is now taking Vimpat as needed-he is under care of [facility] neurology  History of seronegative encephalitis Was on steroids last year-plan is apparently to start right toxin-this was apparently being scheduled for early September. Currently without any major headache-no  obvious disorientation-or seizures.  BMI: Estimated body mass index is 26.5 kg/m as calculated from the following:   Height as of this encounter: 5\' 11"  (1.803 m).   Weight as of this encounter: 86.2 kg.    Code status:   Code Status: Full Code   DVT Prophylaxis: SCDs Start: 05/24/23 0529    Family Communication: None at bedside   Disposition Plan: Status is: Observation The patient remains OBS appropriate and will d/c before 2 midnights.   Planned Discharge Destination:Home   Diet: Diet Order             Diet Heart Room service appropriate? Yes; Fluid consistency: Thin  Diet effective now                     Antimicrobial agents: Anti-infectives (From admission, onward)    Start     Dose/Rate Route Frequency Ordered Stop   05/23/23 1600  vancomycin (VANCOCIN) IVPB 1000 mg/200 mL premix        1,000 mg 200 mL/hr over 60 Minutes Intravenous  Once 05/23/23 1411 05/23/23 1837   05/23/23 1415  ceFEPIme (MAXIPIME) 2 g in sodium chloride 0.9 % 100 mL IVPB        2 g 200 mL/hr over 30 Minutes Intravenous  Once 05/23/23 1408 05/23/23 1508   05/23/23 1415  metroNIDAZOLE (FLAGYL) IVPB 500 mg        500 mg 100 mL/hr over 60 Minutes Intravenous  Once 05/23/23 1408 05/23/23 1557   05/23/23 1415  vancomycin (VANCOCIN) IVPB 1000 mg/200 mL premix        1,000 mg 200 mL/hr over 60 Minutes Intravenous  Once 05/23/23 1408 05/23/23 2009        MEDICATIONS: Scheduled Meds:  acidophilus  1 capsule Oral BID   amitriptyline  50 mg Oral QHS   pantoprazole  40 mg Oral Q1200   polyethylene glycol  17 g Oral BID   saccharomyces boulardii  250 mg Oral BID   senna-docusate  2 tablet Oral BID   Continuous Infusions:  sodium chloride Stopped (05/23/23 2010)   PRN Meds:.sodium chloride, acetaminophen **OR** acetaminophen, alum & mag hydroxide-simeth, HYDROmorphone (DILAUDID) injection, ketorolac, ondansetron **OR** ondansetron (ZOFRAN) IV, oxyCODONE   I have personally reviewed following labs and imaging studies  LABORATORY DATA: CBC: Recent Labs  Lab 05/23/23 1249 05/24/23 0625  WBC 25.0* 15.6*  NEUTROABS 16.5* 10.0*  HGB 16.5 13.8  HCT 46.7 40.3  MCV 82.8 84.1  PLT  342 250    Basic Metabolic Panel: Recent Labs  Lab 05/23/23 1249 05/24/23 0625  NA 133* 133*  K 3.4* 4.1  CL 101 104  CO2 19* 24  GLUCOSE 105* 98  BUN 13 8  CREATININE 1.05 0.88  CALCIUM 9.7 8.6*  MG  --  1.9    GFR: Estimated Creatinine Clearance: 129.5 mL/min (by C-G formula based on SCr of 0.88 mg/dL).  Liver Function Tests: Recent Labs  Lab 05/23/23 1249 05/24/23 0625  AST 22 15  ALT 16 14  ALKPHOS 79 61  BILITOT 0.5 0.8  PROT 7.8 6.0*  ALBUMIN 4.7 3.3*   Recent Labs  Lab 05/23/23 1249  LIPASE 37   No results for input(s): "AMMONIA" in the last 168 hours.  Coagulation Profile: Recent Labs  Lab 05/23/23 1249  INR 1.0    Cardiac Enzymes: No results for input(s): "CKTOTAL", "CKMB", "CKMBINDEX", "TROPONINI" in the last 168 hours.  BNP (last 3 results) No results for input(s): "PROBNP" in  the last 8760 hours.  Lipid Profile: No results for input(s): "CHOL", "HDL", "LDLCALC", "TRIG", "CHOLHDL", "LDLDIRECT" in the last 72 hours.  Thyroid Function Tests: No results for input(s): "TSH", "T4TOTAL", "FREET4", "T3FREE", "THYROIDAB" in the last 72 hours.  Anemia Panel: No results for input(s): "VITAMINB12", "FOLATE", "FERRITIN", "TIBC", "IRON", "RETICCTPCT" in the last 72 hours.  Urine analysis:    Component Value Date/Time   COLORURINE YELLOW 05/23/2023 1249   APPEARANCEUR CLEAR 05/23/2023 1249   LABSPEC 1.020 05/23/2023 1249   PHURINE 8.5 (H) 05/23/2023 1249   GLUCOSEU 100 (A) 05/23/2023 1249   HGBUR NEGATIVE 05/23/2023 1249   BILIRUBINUR NEGATIVE 05/23/2023 1249   KETONESUR NEGATIVE 05/23/2023 1249   PROTEINUR NEGATIVE 05/23/2023 1249   UROBILINOGEN 0.2 12/09/2013 0330   NITRITE NEGATIVE 05/23/2023 1249   LEUKOCYTESUR NEGATIVE 05/23/2023 1249    Sepsis Labs: Lactic Acid, Venous    Component Value Date/Time   LATICACIDVEN 1.2 05/23/2023 1843    MICROBIOLOGY: Recent Results (from the past 240 hour(s))  Culture, blood (Routine x 2)      Status: None (Preliminary result)   Collection Time: 05/23/23 12:38 PM   Specimen: BLOOD  Result Value Ref Range Status   Specimen Description   Final    BLOOD RIGHT ANTECUBITAL Performed at [facility], [address] [place], [place] [address]    Special Requests   Final    BOTTLES DRAWN AEROBIC AND ANAEROBIC Blood Culture adequate volume Performed at [facility], [address] [place], [place] [address]    Culture   Final    NO GROWTH < 12 HOURS Performed at [facility], [address], [place], [place] [address]    Report Status PENDING  Incomplete  Resp panel by RT-PCR (RSV, Flu A&B, Covid) Anterior Nasal Swab     Status: None   Collection Time: 05/23/23 12:49 PM   Specimen: Anterior Nasal Swab  Result Value Ref Range Status   SARS Coronavirus 2 by RT PCR NEGATIVE NEGATIVE Final    Comment: (NOTE) SARS-CoV-2 target nucleic acids are NOT DETECTED.  The SARS-CoV-2 RNA is generally detectable in upper respiratory specimens during the acute phase of infection. The lowest concentration of SARS-CoV-2 viral copies this assay can detect is 138 copies/mL. A negative result does not preclude SARS-Cov-2 infection and should not be used as the sole basis for treatment or other patient management decisions. A negative result may occur with  improper specimen collection/handling, submission of specimen other than nasopharyngeal swab, presence of viral mutation(s) within the areas targeted by this assay, and inadequate number of viral copies(<138 copies/mL). A negative result must be combined with clinical observations, patient history, and epidemiological information. The expected result is Negative.  Fact Sheet for Patients:  BloggerCourse.com  Fact Sheet for Healthcare Providers:  SeriousBroker.it  This test is no t yet approved or cleared by the [place] FDA and  has been authorized for  detection and/or diagnosis of SARS-CoV-2 by FDA under an Emergency Use Authorization (EUA). This EUA will remain  in effect (meaning this test can be used) for the duration of the COVID-19 declaration under Section 564(b)(1) of the Act, 21 U.S.C.section 360bbb-3(b)(1), unless the authorization is terminated  or revoked sooner.       Influenza A by PCR NEGATIVE NEGATIVE Final   Influenza B by PCR NEGATIVE NEGATIVE Final    Comment: (NOTE) The Xpert Xpress SARS-CoV-2/FLU/RSV plus assay is intended as an aid in the diagnosis of  influenza from Nasopharyngeal swab specimens and should not be used as a sole basis for treatment. Nasal washings and aspirates are unacceptable for Xpert Xpress SARS-CoV-2/FLU/RSV testing.  Fact Sheet for Patients: BloggerCourse.com  Fact Sheet for Healthcare Providers: SeriousBroker.it  This test is not yet approved or cleared by the [place] FDA and has been authorized for detection and/or diagnosis of SARS-CoV-2 by FDA under an Emergency Use Authorization (EUA). This EUA will remain in effect (meaning this test can be used) for the duration of the COVID-19 declaration under Section 564(b)(1) of the Act, 21 U.S.C. section 360bbb-3(b)(1), unless the authorization is terminated or revoked.     Resp Syncytial Virus by PCR NEGATIVE NEGATIVE Final    Comment: (NOTE) Fact Sheet for Patients: BloggerCourse.com  Fact Sheet for Healthcare Providers: SeriousBroker.it  This test is not yet approved or cleared by the [place] FDA and has been authorized for detection and/or diagnosis of SARS-CoV-2 by FDA under an Emergency Use Authorization (EUA). This EUA will remain in effect (meaning this test can be used) for the duration of the COVID-19 declaration under Section 564(b)(1) of the Act, 21 U.S.C. section 360bbb-3(b)(1), unless the authorization is  terminated or revoked.  Performed at [facility], [address] [place], [place] [address]   Culture, blood (Routine x 2)     Status: None (Preliminary result)   Collection Time: 05/23/23 12:50 PM   Specimen: BLOOD  Result Value Ref Range Status   Specimen Description   Final    BLOOD RIGHT ANTECUBITAL Performed at [facility], [address] [place], [place] [address]    Special Requests   Final    BOTTLES DRAWN AEROBIC AND ANAEROBIC Blood Culture adequate volume Performed at [facility], [address] [place], [place] [address]    Culture   Final    NO GROWTH < 12 HOURS Performed at [facility], [address], [place], [place] [address]    Report Status PENDING  Incomplete    RADIOLOGY STUDIES/RESULTS: CT Head Wo Contrast  Result Date: 05/23/2023 CLINICAL DATA:  Headaches EXAM: CT HEAD WITHOUT CONTRAST TECHNIQUE: Contiguous axial images were obtained from the base of the skull through the vertex without intravenous contrast. RADIATION DOSE REDUCTION: This exam was performed according to the departmental dose-optimization program which includes automated exposure control, adjustment of the mA and/or kV according to patient size and/or use of iterative reconstruction technique. COMPARISON:  05/15/2022 FINDINGS: Brain: No evidence of acute infarction, hemorrhage, hydrocephalus, extra-axial collection or mass lesion/mass effect. Vascular: No hyperdense vessel or unexpected calcification. Skull: No osseous abnormality. Sinuses/Orbits: Visualized paranasal sinuses are clear. Visualized mastoid sinuses are clear. Visualized orbits demonstrate no focal abnormality. Other: None IMPRESSION: 1. No acute intracranial findings. Electronically Signed   By: Elige Ko M.D.   On: 05/23/2023 17:17   CT CHEST ABDOMEN PELVIS W CONTRAST  Result Date: 05/23/2023 CLINICAL DATA:  Headache. Vomiting with elevated white blood cell count. Progressive  symptoms. EXAM: CT CHEST, ABDOMEN, AND PELVIS WITH CONTRAST TECHNIQUE: Multidetector CT imaging of the chest, abdomen and pelvis was performed following the standard protocol during bolus administration of intravenous contrast. RADIATION DOSE REDUCTION: This exam was performed according to the departmental dose-optimization program which includes automated exposure control, adjustment of the mA and/or kV according to patient size and/or use of iterative reconstruction technique. CONTRAST:  OMNIPAQUE IOHEXOL 300 MG/ML  SOLN COMPARISON:  Chest x-ray earlier 05/23/2023. Abdomen pelvis CT  01/16/2023. Older exams as well. FINDINGS: CT CHEST FINDINGS Cardiovascular: Trace pericardial fluid. The heart is nonenlarged. The thoracic aorta has a normal course and caliber. Pulsation artifact along the ascending aorta. No mediastinal hematoma. Mediastinum/Nodes: Slightly patulous thoracic esophagus. Preserved thyroid gland. There is some triangular-shaped soft tissue in the anterior superior mediastinum which could relate to residual thymus. No specific abnormal lymph node enlargement identified in the axillary regions, hilum or mediastinum Lungs/Pleura: There is some linear opacity lung bases likely scar or atelectasis. No pleural effusion. There is some patchy parenchymal opacity along the posterior right upper lobe with some interstitial components, ground-glass and nodular areas. Example the largest nodular area on series 4 image 62 measures 9 mm again this has a differential including an infiltrative process. Musculoskeletal: Mild degenerative changes. There is posterior disc bulge and osteophytes at T12-L1 with flattening of the ventral aspect of the thecal sac mildly CT ABDOMEN PELVIS FINDINGS Hepatobiliary: Mild fatty liver infiltration. No space-occupying liver lesion. Patent portal vein. The gallbladder is nondilated. Pancreas: Unremarkable. No pancreatic ductal dilatation or surrounding inflammatory changes.  Spleen: Normal in size without focal abnormality. Adrenals/Urinary Tract: The adrenal glands are preserved. No enhancing renal mass or collecting system dilatation. The ureters have normal course and caliber extending down to the bladder. Preserved contours of the urinary bladder. Stomach/Bowel: There is some fluid in the stomach. Stomach is nondilated. On this non oral contrast exam, the small bowel is nondilated. Large bowel is of normal course and caliber with scattered colonic stool. Normal appendix in the right lower quadrant Vascular/Lymphatic: No significant vascular findings are present. No enlarged abdominal or pelvic lymph nodes. Reproductive: Prostate is unremarkable. Other: No abdominal wall hernia or abnormality. No abdominopelvic ascites. Musculoskeletal: Small osteophytes seen along the sacroiliac joints. There is left-sided transitional lumbosacral segment. IMPRESSION: Subtle patchy nodular infiltrative type opacity in the inferior right upper lobe. This has a differential but infection is one possibility recommend short follow-up. No effusion. No bowel obstruction, free air or free fluid. Normal appendix. Scattered stool. Fatty liver infiltration Electronically Signed   By: Karen Kays M.D.   On: 05/23/2023 17:13   DG Chest Port 1 View  Result Date: 05/23/2023 CLINICAL DATA:  Hematemesis and abdominal pain EXAM: PORTABLE CHEST 1 VIEW COMPARISON:  Chest radiograph dated 01/16/2023 FINDINGS: Normal lung volumes. No focal consolidations. No pleural effusion or pneumothorax. The heart size and mediastinal contours are within normal limits. No acute osseous abnormality. IMPRESSION: No active disease. Electronically Signed   By: Agustin Cree M.D.   On: 05/23/2023 15:16     LOS: 1 day   Jeoffrey Massed, MD  [facility]    To contact the attending provider between 7A-7P or the covering provider during after hours 7P-7A, please log into the web site [email or web address] and access using universal  [place] password for that web site. If you do not have the password, please call the hospital operator.  05/24/2023, 9:20 AM

## 2023-05-24 NOTE — Assessment & Plan Note (Signed)
Repleted with IV kcl

## 2023-05-24 NOTE — Assessment & Plan Note (Addendum)
Pt has had chronic abd pain for about 12 years. Waxes and wanes over the years. Most recently has gotten worse with this headaches. Pt and wife states they saw GI specialist while he was at [facility]. DC summary reviewed.  It was thought pt had viral gastroenteritis by GI specialist. Pt on lexapro for depression/anxiety. Not sure if IBS with diarrhea predominance has been entertained. Discussed with him about adding Elavil at night to help. He is agreeable. Also discussed about adding Cymbalta to his regimen and stopping lexapro. He said he will talk with is neurologist. Will give 1 dose of IV dilaudid but pt and wife informed no further opiates would be prescribed for chronic abd pain. Start probiotic. I really think pt should f/u with [facility] to consider GI neurologic axis as a source for his chronic abdominal pain. His Elavil dose can be uptitrated then. Again, opiate medications should not be prescribed for chronic abd pain. For completeness sake, check H. Pylori ab. It would be a shame to miss such an easy diagnosis for his chronic abd pain.  Porphyria was also evaluated at [facility] and was ruled out porphyrin labs.

## 2023-05-24 NOTE — Assessment & Plan Note (Signed)
Patient only taking Vimpat as needed.  He last had it about 2 days ago.  Patient and wife state that he had a seizure many years ago.  He has not been taking his Vimpat consistently.  I question whether or not some of his headaches and abdominal pain may be either complex migraines or abdominal migraines.  He states that he sees [facility] neurolog[facility].  He needs to follow-up with [facility] neurolog[facility] about his migraines.  He is not taking any medications for prophylaxis including propranolol.  He may benefit from prophylactic migraine medications.

## 2023-05-25 LAB — CBC WITH DIFFERENTIAL/PLATELET
Abs Immature Granulocytes: 0.05 10*3/uL (ref 0.00–0.07)
Basophils Absolute: 0.1 10*3/uL (ref 0.0–0.1)
Basophils Relative: 1 %
Eosinophils Absolute: 0.3 10*3/uL (ref 0.0–0.5)
Eosinophils Relative: 2 %
HCT: 42.7 % (ref 39.0–52.0)
Hemoglobin: 14.7 g/dL (ref 13.0–17.0)
Immature Granulocytes: 0 %
Lymphocytes Relative: 32 %
Lymphs Abs: 3.9 10*3/uL (ref 0.7–4.0)
MCH: 29.8 pg (ref 26.0–34.0)
MCHC: 34.4 g/dL (ref 30.0–36.0)
MCV: 86.6 fL (ref 80.0–100.0)
Monocytes Absolute: 1.5 10*3/uL — ABNORMAL HIGH (ref 0.1–1.0)
Monocytes Relative: 12 %
Neutro Abs: 6.2 10*3/uL (ref 1.7–7.7)
Neutrophils Relative %: 53 %
Platelets: 259 10*3/uL (ref 150–400)
RBC: 4.93 MIL/uL (ref 4.22–5.81)
RDW: 12.1 % (ref 11.5–15.5)
WBC: 11.9 10*3/uL — ABNORMAL HIGH (ref 4.0–10.5)
nRBC: 0 % (ref 0.0–0.2)

## 2023-05-25 MED ORDER — OXYCODONE HCL 5 MG PO TABS
5.0000 mg | ORAL_TABLET | Freq: Four times a day (QID) | ORAL | 0 refills | Status: DC | PRN
Start: 2023-05-25 — End: 2024-04-21

## 2023-05-25 MED ORDER — DICYCLOMINE HCL 20 MG PO TABS: 20.0000 mg | ORAL_TABLET | Freq: Two times a day (BID) | ORAL | 0 refills | Status: DC | PRN

## 2023-05-25 MED ORDER — ONDANSETRON HCL 4 MG PO TABS: 4.0000 mg | ORAL_TABLET | Freq: Four times a day (QID) | ORAL | 0 refills | Status: DC | PRN

## 2023-05-25 NOTE — Plan of Care (Signed)
  Problem: Clinical Measurements: Goal: Diagnostic test results will improve Outcome: Progressing   

## 2023-05-25 NOTE — Discharge Summary (Signed)
PATIENT DETAILS Name: Nathan Daugherty Age: 32 y.o. Sex: male Date of Birth: Mar 12, 1991 MRN: [id number]. Admitting Physician: Carollee Herter, DO OHY:[id number], Haydee Salter, MD  Admit Date: 05/23/2023 Discharge date: 05/25/2023  Recommendations for Outpatient Follow-up:  Follow up with PCP in 1-2 weeks Please obtain CMP/CBC in one week Please ensure follow-up with [facility] neurolog[facility] and his primary gastroenterologist.    Admitted From:  Home  Disposition: Home   Discharge Condition: good  CODE STATUS:   Code Status: Full Code   Diet recommendation:  Diet Order             Diet general           Diet Heart Room service appropriate? Yes; Fluid consistency: Thin  Diet effective now                    Brief Summary: Patient is a 32 y.o.  male with a history of seronegative encephalitis, chronic abdominal pain of unknown etiology, seizure disorder who presented flare of his underlying chronic abdominal pain-in the setting of runny nose/cough/vomiting (?  Viral prodrome)   Significant events: 8/16>> admit to [facility]   Significant studies: 8/16>> CT chest/abdomen/pelvis: Nodular infiltrative opacity in the inferior right upper lobe-no bowel obstruction/free air/free fluid.  Some scattered stool. 8/16>> CT head: No acute intracranial findings.   Significant microbiology data: 8/16>> COVID/influenza/RSV PCR: Negative 8/16>> blood culture: No growth   Procedures: None   Consults: None  Brief Hospital Course: Acute on chronic abdominal pain Per patient history-he gets these flares when he has what sounds like a viral syndrome/infection CT abdomen without any acute findings-stable/benign abdominal exam Admitted and provided supportive care At his request-he was given several doses of IV Dilaudid-this morning he feels better-still with some pain but not as intense as yesterday.  Abdominal exam continues to be reassuring.  Tolerating diet without any issues.  No nausea or  vomiting Unclear etiology of underlying pathology-he has had extensive workup-please see H&P by done by Dr. Olivia Canter since he is improved-tolerating diet-he can be discharged home-he is requesting some oral narcotics to manage his resolving pain that is not yet back to baseline-I will provide him a few days supply of oxycodone.  He is aware that he needs to avoid narcotics as much as possible-we discussed side effects like constipation etc.   ?  Viral prodrome Some vomiting/runny nose/intermittent headaches for the past several days prior to this hospitalization No concerning findings on exam-CT imaging negative for any major infection-all cultures negative so far Leukocytosis is downtrending and has almost normalized Was monitored off antibiotics during this hospitalization.  Cultures remain negative so far.   Depression/anxiety Has been switched to Elavil by admitting MD-however wants to continue Lexapro as prior.  Will not continue Elavil on discharge.  He will follow-up with his neurologist.   Reported history of seizure disorder Apparently he is now taking Vimpat as needed-he is under care of [facility]   History of seronegative encephalitis Was on steroids last year-plan is apparently to start rituximab-this was apparently being scheduled for early September. Currently without any major headache-no obvious disorientation-or seizures.   BMI: Estimated body mass index is 26.5 kg/m as calculated from the following:   Height as of this encounter: 5\' 11"  (1.803 m).   Weight as of this encounter: 86.2 kg.    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  Please get a complete blood count and chemistry panel checked by your Primary MD at your next visit, and again as instructed by your Primary MD.  Get Medicines reviewed and adjusted: Please take all your medications with you for your next visit with your Primary MD  Laboratory/radiological data: Please request your Primary MD to go over all hospital tests and procedure/radiological results at the follow up, please ask your Primary MD to get all Hospital records sent to his/her office.  In some cases, they will be blood work, cultures and biopsy results pending at the time of your discharge. Please request that your primary care M.D. follows up on these results.  Also Note the following: If you experience worsening of your admission symptoms, develop shortness of breath, life threatening emergency, suicidal or homicidal thoughts you must seek medical attention immediately by calling 911 or calling your MD immediately  if symptoms less severe.  You must read complete instructions/literature along with all the possible adverse reactions/side effects for all the Medicines you take and that have been prescribed to you. Take any new Medicines after you have completely understood and accpet all the possible adverse reactions/side effects.   Do not drive when taking Pain medications or sleeping medications (Benzodaizepines)  Do not take more than prescribed Pain, Sleep and Anxiety Medications. It is not advisable to combine anxiety,sleep  and pain medications without talking with your primary care practitioner  Special Instructions: If you have smoked or chewed Tobacco  in the last 2 yrs please stop smoking, stop any regular Alcohol  and or any Recreational drug use.  Wear Seat belts while driving.  Please note: You were cared for by a hospitalist during your hospital stay. Once you are discharged, your primary care physician will handle any further medical issues. Please note that NO REFILLS for any discharge medications will be authorized once you are discharged, as it is imperative that you return to your primary care physician (or establish a relationship with a primary care physician if you do not have one) for your post hospital discharge needs so that they can reassess your need for medications and monitor your lab values.   Increase activity slowly   Complete by: As directed       Allergies as of 05/25/2023   No Known Allergies      Medication List     TAKE these medications    dicyclomine 20 MG tablet Commonly known as: BENTYL Take 1 tablet (20 mg total) by mouth 2 (two) times daily as needed for spasms.   escitalopram 10 MG tablet Commonly known as: LEXAPRO Take 10 mg by mouth as needed (anxiety).   ondansetron 4 MG tablet Commonly known as: ZOFRAN  Take 1 tablet (4 mg total) by mouth every 6 (six) hours as needed for nausea or vomiting.   oxyCODONE 5 MG immediate release tablet Commonly known as: Roxicodone Take 1 tablet (5 mg total) by mouth every 6 (six) hours as needed for moderate pain.   Vimpat 100 MG Tabs Generic drug: Lacosamide Take 100 mg by mouth as needed (seizures).        Follow-up Information     Tish Frederickson, MD. Schedule an appointment as soon as possible for a visit in 1 week(s).   Specialty: Neurology Contact information: [address] [place] [address] [phone]                No Known Allergies   Other Procedures/Studies: CT Head Wo  Contrast  Result Date: 05/23/2023 CLINICAL DATA:  Headaches EXAM: CT HEAD WITHOUT CONTRAST TECHNIQUE: Contiguous axial images were obtained from the base of the skull through the vertex without intravenous contrast. RADIATION DOSE REDUCTION: This exam was performed according to the departmental dose-optimization program which includes automated exposure control, adjustment of the mA and/or kV according to patient size and/or use of iterative reconstruction technique. COMPARISON:  05/15/2022 FINDINGS: Brain: No evidence of acute infarction, hemorrhage, hydrocephalus, extra-axial collection or mass lesion/mass effect. Vascular: No hyperdense vessel or unexpected calcification. Skull: No osseous abnormality. Sinuses/Orbits: Visualized paranasal sinuses are clear. Visualized mastoid sinuses are clear. Visualized orbits demonstrate no focal abnormality. Other: None IMPRESSION: 1. No acute intracranial findings. Electronically Signed   By: Elige Ko M.D.   On: 05/23/2023 17:17   CT CHEST ABDOMEN PELVIS W CONTRAST  Result Date: 05/23/2023 CLINICAL DATA:  Headache. Vomiting with elevated white blood cell count. Progressive symptoms. EXAM: CT CHEST, ABDOMEN, AND PELVIS WITH CONTRAST TECHNIQUE: Multidetector CT imaging of the chest, abdomen and pelvis was performed following the standard protocol during bolus administration of intravenous contrast. RADIATION DOSE REDUCTION: This exam was performed according to the departmental dose-optimization program which includes automated exposure control, adjustment of the mA and/or kV according to patient size and/or use of iterative reconstruction technique. CONTRAST:  OMNIPAQUE IOHEXOL 300 MG/ML  SOLN COMPARISON:  Chest x-ray earlier 05/23/2023. Abdomen pelvis CT 01/16/2023. Older exams as well. FINDINGS: CT CHEST FINDINGS Cardiovascular: Trace pericardial fluid. The heart is nonenlarged. The thoracic aorta has a normal course and caliber. Pulsation artifact along the  ascending aorta. No mediastinal hematoma. Mediastinum/Nodes: Slightly patulous thoracic esophagus. Preserved thyroid gland. There is some triangular-shaped soft tissue in the anterior superior mediastinum which could relate to residual thymus. No specific abnormal lymph node enlargement identified in the axillary regions, hilum or mediastinum Lungs/Pleura: There is some linear opacity lung bases likely scar or atelectasis. No pleural effusion. There is some patchy parenchymal opacity along the posterior right upper lobe with some interstitial components, ground-glass and nodular areas. Example the largest nodular area on series 4 image 62 measures 9 mm again this has a differential including an infiltrative process. Musculoskeletal: Mild degenerative changes. There is posterior disc bulge and osteophytes at T12-L1 with flattening of the ventral aspect of the thecal sac mildly CT ABDOMEN PELVIS FINDINGS Hepatobiliary: Mild fatty liver infiltration. No space-occupying liver lesion. Patent portal vein. The gallbladder is nondilated. Pancreas: Unremarkable. No pancreatic ductal dilatation or surrounding inflammatory changes. Spleen: Normal in size without focal abnormality. Adrenals/Urinary Tract: The adrenal glands are preserved. No enhancing renal mass or collecting system dilatation. The ureters have normal course and caliber extending down to the bladder. Preserved contours of the urinary  bladder. Stomach/Bowel: There is some fluid in the stomach. Stomach is nondilated. On this non oral contrast exam, the small bowel is nondilated. Large bowel is of normal course and caliber with scattered colonic stool. Normal appendix in the right lower quadrant Vascular/Lymphatic: No significant vascular findings are present. No enlarged abdominal or pelvic lymph nodes. Reproductive: Prostate is unremarkable. Other: No abdominal wall hernia or abnormality. No abdominopelvic ascites. Musculoskeletal: Small osteophytes seen along  the sacroiliac joints. There is left-sided transitional lumbosacral segment. IMPRESSION: Subtle patchy nodular infiltrative type opacity in the inferior right upper lobe. This has a differential but infection is one possibility recommend short follow-up. No effusion. No bowel obstruction, free air or free fluid. Normal appendix. Scattered stool. Fatty liver infiltration Electronically Signed   By: Karen Kays M.D.   On: 05/23/2023 17:13   DG Chest Port 1 View  Result Date: 05/23/2023 CLINICAL DATA:  Hematemesis and abdominal pain EXAM: PORTABLE CHEST 1 VIEW COMPARISON:  Chest radiograph dated 01/16/2023 FINDINGS: Normal lung volumes. No focal consolidations. No pleural effusion or pneumothorax. The heart size and mediastinal contours are within normal limits. No acute osseous abnormality. IMPRESSION: No active disease. Electronically Signed   By: Agustin Cree M.D.   On: 05/23/2023 15:16     TODAY-DAY OF DISCHARGE:  Subjective:   Ann Held today has no headache,no chest abdominal pain,no new weakness tingling or numbness, feels much better wants to go home today.  Objective:   Blood pressure 124/81, pulse (!) 55, temperature 99 F (37.2 C), temperature source Oral, resp. rate 11, height 5\' 11"  (1.803 m), weight 85.8 kg, SpO2 94%.  Intake/Output Summary (Last 24 hours) at 05/25/2023 1046 Last data filed at 05/25/2023 0958 Gross per 24 hour  Intake --  Output 3600 ml  Net -3600 ml   Filed Weights   05/23/23 1232 05/25/23 0500  Weight: 86.2 kg 85.8 kg    Exam: Awake Alert, Oriented *3, No new F.N deficits, Normal affect Gideon.AT,PERRAL Supple Neck,No JVD, No cervical lymphadenopathy appriciated.  Symmetrical Chest wall movement, Good air movement bilaterally, CTAB RRR,No Gallops,Rubs or new Murmurs, No Parasternal Heave +ve B.Sounds, Abd Soft, Non tender, No organomegaly appriciated, No rebound -guarding or rigidity. No Cyanosis, Clubbing or edema, No new Rash or bruise   PERTINENT  RADIOLOGIC STUDIES: CT Head Wo Contrast  Result Date: 05/23/2023 CLINICAL DATA:  Headaches EXAM: CT HEAD WITHOUT CONTRAST TECHNIQUE: Contiguous axial images were obtained from the base of the skull through the vertex without intravenous contrast. RADIATION DOSE REDUCTION: This exam was performed according to the departmental dose-optimization program which includes automated exposure control, adjustment of the mA and/or kV according to patient size and/or use of iterative reconstruction technique. COMPARISON:  05/15/2022 FINDINGS: Brain: No evidence of acute infarction, hemorrhage, hydrocephalus, extra-axial collection or mass lesion/mass effect. Vascular: No hyperdense vessel or unexpected calcification. Skull: No osseous abnormality. Sinuses/Orbits: Visualized paranasal sinuses are clear. Visualized mastoid sinuses are clear. Visualized orbits demonstrate no focal abnormality. Other: None IMPRESSION: 1. No acute intracranial findings. Electronically Signed   By: Elige Ko M.D.   On: 05/23/2023 17:17   CT CHEST ABDOMEN PELVIS W CONTRAST  Result Date: 05/23/2023 CLINICAL DATA:  Headache. Vomiting with elevated white blood cell count. Progressive symptoms. EXAM: CT CHEST, ABDOMEN, AND PELVIS WITH CONTRAST TECHNIQUE: Multidetector CT imaging of the chest, abdomen and pelvis was performed following the standard protocol during bolus administration of intravenous contrast. RADIATION DOSE REDUCTION: This exam was performed according to the departmental dose-optimization program which  includes automated exposure control, adjustment of the mA and/or kV according to patient size and/or use of iterative reconstruction technique. CONTRAST:  OMNIPAQUE IOHEXOL 300 MG/ML  SOLN COMPARISON:  Chest x-ray earlier 05/23/2023. Abdomen pelvis CT 01/16/2023. Older exams as well. FINDINGS: CT CHEST FINDINGS Cardiovascular: Trace pericardial fluid. The heart is nonenlarged. The thoracic aorta has a normal course and  caliber. Pulsation artifact along the ascending aorta. No mediastinal hematoma. Mediastinum/Nodes: Slightly patulous thoracic esophagus. Preserved thyroid gland. There is some triangular-shaped soft tissue in the anterior superior mediastinum which could relate to residual thymus. No specific abnormal lymph node enlargement identified in the axillary regions, hilum or mediastinum Lungs/Pleura: There is some linear opacity lung bases likely scar or atelectasis. No pleural effusion. There is some patchy parenchymal opacity along the posterior right upper lobe with some interstitial components, ground-glass and nodular areas. Example the largest nodular area on series 4 image 62 measures 9 mm again this has a differential including an infiltrative process. Musculoskeletal: Mild degenerative changes. There is posterior disc bulge and osteophytes at T12-L1 with flattening of the ventral aspect of the thecal sac mildly CT ABDOMEN PELVIS FINDINGS Hepatobiliary: Mild fatty liver infiltration. No space-occupying liver lesion. Patent portal vein. The gallbladder is nondilated. Pancreas: Unremarkable. No pancreatic ductal dilatation or surrounding inflammatory changes. Spleen: Normal in size without focal abnormality. Adrenals/Urinary Tract: The adrenal glands are preserved. No enhancing renal mass or collecting system dilatation. The ureters have normal course and caliber extending down to the bladder. Preserved contours of the urinary bladder. Stomach/Bowel: There is some fluid in the stomach. Stomach is nondilated. On this non oral contrast exam, the small bowel is nondilated. Large bowel is of normal course and caliber with scattered colonic stool. Normal appendix in the right lower quadrant Vascular/Lymphatic: No significant vascular findings are present. No enlarged abdominal or pelvic lymph nodes. Reproductive: Prostate is unremarkable. Other: No abdominal wall hernia or abnormality. No abdominopelvic ascites.  Musculoskeletal: Small osteophytes seen along the sacroiliac joints. There is left-sided transitional lumbosacral segment. IMPRESSION: Subtle patchy nodular infiltrative type opacity in the inferior right upper lobe. This has a differential but infection is one possibility recommend short follow-up. No effusion. No bowel obstruction, free air or free fluid. Normal appendix. Scattered stool. Fatty liver infiltration Electronically Signed   By: Karen Kays M.D.   On: 05/23/2023 17:13   DG Chest Port 1 View  Result Date: 05/23/2023 CLINICAL DATA:  Hematemesis and abdominal pain EXAM: PORTABLE CHEST 1 VIEW COMPARISON:  Chest radiograph dated 01/16/2023 FINDINGS: Normal lung volumes. No focal consolidations. No pleural effusion or pneumothorax. The heart size and mediastinal contours are within normal limits. No acute osseous abnormality. IMPRESSION: No active disease. Electronically Signed   By: Agustin Cree M.D.   On: 05/23/2023 15:16     PERTINENT LAB RESULTS: CBC: Recent Labs    05/24/23 0625 05/25/23 0407  WBC 15.6* 11.9*  HGB 13.8 14.7  HCT 40.3 42.7  PLT 250 259   CMET CMP     Component Value Date/Time   NA 133 (L) 05/24/2023 0625   K 4.1 05/24/2023 0625   CL 104 05/24/2023 0625   CO2 24 05/24/2023 0625   GLUCOSE 98 05/24/2023 0625   BUN 8 05/24/2023 0625   CREATININE 0.88 05/24/2023 0625   CALCIUM 8.6 (L) 05/24/2023 0625   PROT 6.0 (L) 05/24/2023 0625   ALBUMIN 3.3 (L) 05/24/2023 0625   AST 15 05/24/2023 0625   ALT 14 05/24/2023 0625   ALKPHOS  61 05/24/2023 0625   BILITOT 0.8 05/24/2023 0625   GFRNONAA >60 05/24/2023 0625    GFR Estimated Creatinine Clearance: 129.5 mL/min (by C-G formula based on SCr of 0.88 mg/dL). Recent Labs    05/23/23 1249  LIPASE 37   No results for input(s): "CKTOTAL", "CKMB", "CKMBINDEX", "TROPONINI" in the last 72 hours. Invalid input(s): "POCBNP" No results for input(s): "DDIMER" in the last 72 hours. No results for input(s): "HGBA1C" in  the last 72 hours. No results for input(s): "CHOL", "HDL", "LDLCALC", "TRIG", "CHOLHDL", "LDLDIRECT" in the last 72 hours. No results for input(s): "TSH", "T4TOTAL", "T3FREE", "THYROIDAB" in the last 72 hours.  Invalid input(s): "FREET3" No results for input(s): "VITAMINB12", "FOLATE", "FERRITIN", "TIBC", "IRON", "RETICCTPCT" in the last 72 hours. Coags: Recent Labs    05/23/23 1249  INR 1.0   Microbiology: Recent Results (from the past 240 hour(s))  Culture, blood (Routine x 2)     Status: None (Preliminary result)   Collection Time: 05/23/23 12:38 PM   Specimen: BLOOD  Result Value Ref Range Status   Specimen Description   Final    BLOOD RIGHT ANTECUBITAL Performed at [facility], [address] [place], [place] [address]    Special Requests   Final    BOTTLES DRAWN AEROBIC AND ANAEROBIC Blood Culture adequate volume Performed at [facility], [address] [place], [place] [address]    Culture   Final    NO GROWTH 2 DAYS Performed at [facility], [address] [place], [place] [address]    Report Status PENDING  Incomplete  Resp panel by RT-PCR (RSV, Flu A&B, Covid) Anterior Nasal Swab     Status: None   Collection Time: 05/23/23 12:49 PM   Specimen: Anterior Nasal Swab  Result Value Ref Range Status   SARS Coronavirus 2 by RT PCR NEGATIVE NEGATIVE Final    Comment: (NOTE) SARS-CoV-2 target nucleic acids are NOT DETECTED.  The SARS-CoV-2 RNA is generally detectable in upper respiratory specimens during the acute phase of infection. The lowest concentration of SARS-CoV-2 viral copies this assay can detect is 138 copies/mL. A negative result does not preclude SARS-Cov-2 infection and should not be used as the sole basis for treatment or other patient management decisions. A negative result may occur with  improper specimen collection/handling, submission of specimen other than nasopharyngeal swab, presence of viral mutation(s) within  the areas targeted by this assay, and inadequate number of viral copies(<138 copies/mL). A negative result must be combined with clinical observations, patient history, and epidemiological information. The expected result is Negative.  Fact Sheet for Patients:  BloggerCourse.com  Fact Sheet for Healthcare Providers:  SeriousBroker.it  This test is no t yet approved or cleared by the [place] FDA and  has been authorized for detection and/or diagnosis of SARS-CoV-2 by FDA under an Emergency Use Authorization (EUA). This EUA will remain  in effect (meaning this test can be used) for the duration of the COVID-19 declaration under Section 564(b)(1) of the Act, 21 U.S.C.section 360bbb-3(b)(1), unless the authorization is terminated  or revoked sooner.       Influenza A by PCR NEGATIVE NEGATIVE Final   Influenza B by PCR NEGATIVE NEGATIVE Final    Comment: (NOTE) The Xpert Xpress SARS-CoV-2/FLU/RSV plus assay is intended as an aid in the diagnosis of influenza from Nasopharyngeal swab specimens and should not be used as a sole basis for treatment. Nasal washings and aspirates are unacceptable for Xpert  Xpress SARS-CoV-2/FLU/RSV testing.  Fact Sheet for Patients: BloggerCourse.com  Fact Sheet for Healthcare Providers: SeriousBroker.it  This test is not yet approved or cleared by the [place] FDA and has been authorized for detection and/or diagnosis of SARS-CoV-2 by FDA under an Emergency Use Authorization (EUA). This EUA will remain in effect (meaning this test can be used) for the duration of the COVID-19 declaration under Section 564(b)(1) of the Act, 21 U.S.C. section 360bbb-3(b)(1), unless the authorization is terminated or revoked.     Resp Syncytial Virus by PCR NEGATIVE NEGATIVE Final    Comment: (NOTE) Fact Sheet for  Patients: BloggerCourse.com  Fact Sheet for Healthcare Providers: SeriousBroker.it  This test is not yet approved or cleared by the [place] FDA and has been authorized for detection and/or diagnosis of SARS-CoV-2 by FDA under an Emergency Use Authorization (EUA). This EUA will remain in effect (meaning this test can be used) for the duration of the COVID-19 declaration under Section 564(b)(1) of the Act, 21 U.S.C. section 360bbb-3(b)(1), unless the authorization is terminated or revoked.  Performed at [facility], [address] [place], [place] [address]   Culture, blood (Routine x 2)     Status: None (Preliminary result)   Collection Time: 05/23/23 12:50 PM   Specimen: BLOOD  Result Value Ref Range Status   Specimen Description   Final    BLOOD RIGHT ANTECUBITAL Performed at [facility], [address] [place], [place] [address]    Special Requests   Final    BOTTLES DRAWN AEROBIC AND ANAEROBIC Blood Culture adequate volume Performed at [facility], [address] [place], [place] [address]    Culture   Final    NO GROWTH 2 DAYS Performed at [facility], [address], [place], [place] [address]    Report Status PENDING  Incomplete    FURTHER DISCHARGE INSTRUCTIONS:  Get Medicines reviewed and adjusted: Please take all your medications with you for your next visit with your Primary MD  Laboratory/radiological data: Please request your Primary MD to go over all hospital tests and procedure/radiological results at the follow up, please ask your Primary MD to get all Hospital records sent to his/her office.  In some cases, they will be blood work, cultures and biopsy results pending at the time of your discharge. Please request that your primary care M.D. goes through all the records of your hospital data and follows up on these results.  Also Note the following: If you  experience worsening of your admission symptoms, develop shortness of breath, life threatening emergency, suicidal or homicidal thoughts you must seek medical attention immediately by calling 911 or calling your MD immediately  if symptoms less severe.  You must read complete instructions/literature along with all the possible adverse reactions/side effects for all the Medicines you take and that have been prescribed to you. Take any new Medicines after you have completely understood and accpet all the possible adverse reactions/side effects.   Do not drive when taking Pain medications or sleeping medications (Benzodaizepines)  Do not take more than prescribed Pain, Sleep and Anxiety Medications. It is not advisable to combine anxiety,sleep and pain medications without talking with your primary care practitioner  Special Instructions: If you have smoked or chewed Tobacco  in the last 2 yrs please stop smoking, stop any regular Alcohol  and or any Recreational drug use.  Wear Seat belts while driving.  Please note: You were  cared for by a hospitalist during your hospital stay. Once you are discharged, your primary care physician will handle any further medical issues. Please note that NO REFILLS for any discharge medications will be authorized once you are discharged, as it is imperative that you return to your primary care physician (or establish a relationship with a primary care physician if you do not have one) for your post hospital discharge needs so that they can reassess your need for medications and monitor your lab values.  Total Time spent coordinating discharge including counseling, education and face to face time equals greater than 30 minutes.  SignedJeoffrey Massed 05/25/2023 10:46 AM

## 2023-05-25 NOTE — Plan of Care (Signed)

## 2023-05-27 ENCOUNTER — Emergency Department (HOSPITAL_BASED_OUTPATIENT_CLINIC_OR_DEPARTMENT_OTHER)
Admission: EM | Admit: 2023-05-27 | Discharge: 2023-05-27 | Disposition: A | Discharge status: Home / Self Care | Payer: Self-pay

## 2023-05-27 ENCOUNTER — Encounter (HOSPITAL_BASED_OUTPATIENT_CLINIC_OR_DEPARTMENT_OTHER): Payer: Self-pay | Admitting: Urology

## 2023-05-27 ENCOUNTER — Other Ambulatory Visit: Payer: Self-pay

## 2023-05-27 ENCOUNTER — Emergency Department (HOSPITAL_BASED_OUTPATIENT_CLINIC_OR_DEPARTMENT_OTHER): Payer: Self-pay

## 2023-05-27 DIAGNOSIS — D72829 Elevated white blood cell count, unspecified: Secondary | ICD-10-CM | POA: Insufficient documentation

## 2023-05-27 DIAGNOSIS — R001 Bradycardia, unspecified: Secondary | ICD-10-CM | POA: Diagnosis not present

## 2023-05-27 DIAGNOSIS — I808 Phlebitis and thrombophlebitis of other sites: Secondary | ICD-10-CM | POA: Diagnosis not present

## 2023-05-27 DIAGNOSIS — I82611 Acute embolism and thrombosis of superficial veins of right upper extremity: Secondary | ICD-10-CM | POA: Insufficient documentation

## 2023-05-27 DIAGNOSIS — L539 Erythematous condition, unspecified: Secondary | ICD-10-CM | POA: Diagnosis not present

## 2023-05-27 LAB — BASIC METABOLIC PANEL
Anion gap: 9 (ref 5–15)
BUN: 10 mg/dL (ref 6–20)
CO2: 23 mmol/L (ref 22–32)
Calcium: 9.2 mg/dL (ref 8.9–10.3)
Chloride: 103 mmol/L (ref 98–111)
Creatinine, Ser: 0.91 mg/dL (ref 0.61–1.24)
GFR, Estimated: >60 mL/min (ref >60)
Glucose, Bld: 142 mg/dL — ABNORMAL HIGH (ref 70–99)
Potassium: 3.6 mmol/L (ref 3.5–5.1)
Sodium: 135 mmol/L (ref 135–145)

## 2023-05-27 LAB — CBC WITH DIFFERENTIAL/PLATELET
Abs Immature Granulocytes: 0.07 10*3/uL (ref 0.00–0.07)
Basophils Absolute: 0.1 10*3/uL (ref 0.0–0.1)
Basophils Relative: 1 %
Eosinophils Absolute: 0.2 10*3/uL (ref 0.0–0.5)
Eosinophils Relative: 2 %
HCT: 45 % (ref 39.0–52.0)
Hemoglobin: 15.8 g/dL (ref 13.0–17.0)
Immature Granulocytes: 1 %
Lymphocytes Relative: 30 %
Lymphs Abs: 3.8 10*3/uL (ref 0.7–4.0)
MCH: 29.1 pg (ref 26.0–34.0)
MCHC: 35.1 g/dL (ref 30.0–36.0)
MCV: 82.9 fL (ref 80.0–100.0)
Monocytes Absolute: 1.3 10*3/uL — ABNORMAL HIGH (ref 0.1–1.0)
Monocytes Relative: 10 %
Neutro Abs: 7.4 10*3/uL (ref 1.7–7.7)
Neutrophils Relative %: 56 %
Platelets: 299 10*3/uL (ref 150–400)
RBC: 5.43 MIL/uL (ref 4.22–5.81)
RDW: 11.9 % (ref 11.5–15.5)
WBC: 12.8 10*3/uL — ABNORMAL HIGH (ref 4.0–10.5)
nRBC: 0 % (ref 0.0–0.2)

## 2023-05-27 MED ORDER — CEPHALEXIN 500 MG PO CAPS: 500.0000 mg | ORAL_CAPSULE | Freq: Four times a day (QID) | ORAL | 0 refills | Status: DC

## 2023-05-27 NOTE — ED Triage Notes (Signed)
Pt was seen last week and was admitted for sepsis, sttesa concern for DVT to right arm, pain swelling and redness to right arm near elbow joint

## 2023-05-27 NOTE — Discharge Instructions (Signed)
Your ultrasound showed superficial thrombophlebitis likely due to your recent IV placement.  Please use warm compresses on this area.  Your white blood cell count is about what it was when you were discharged from the hospital 2 days ago.  We have prescribed Keflex to cover for the possibility of some mild cellulitis.  Please take this as prescribed.  Return with fevers, worsening redness, severe pain, new or worsening symptoms.

## 2023-05-27 NOTE — ED Provider Notes (Signed)
[place] [facility] Provider Note   CSN: [id number] Arrival date & time: 05/27/23  1005     History  Chief Complaint  Patient presents with   Arm Pain    Nathan Daugherty is a 32 y.o. male.  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  Patient was admitted to the hospital on 8/16 and discharged on 8/18.  ED workup showed elevated lactate which cleared with fluids, elevated white blood cell count, concern for small area of pneumonia on CT.  Patient was admitted and thought to have SIRS criteria, without definitive infection.  Cultures without growth to date.  Patient developed pain in the right elbow yesterday.  The redness seems to be adjacent to area where he had 2 IVs placed.  No recurrent fevers.  He states that he continues to feel generally unwell.      Home Medications Prior to Admission medications   Medication Sig Start Date End Date Taking? Authorizing Provider  dicyclomine (BENTYL) 20 MG tablet Take 1 tablet (20 mg total) by mouth 2 (two) times daily as needed for spasms. 05/25/23   Ghimire, Werner Lean, MD  escitalopram (LEXAPRO) 10 MG tablet Take 10 mg by mouth as needed (anxiety).    [provider]  Lacosamide (VIMPAT) 100 MG TABS Take 100 mg by mouth as needed (seizures).    [provider]  ondansetron (ZOFRAN) 4 MG tablet Take 1 tablet (4 mg total) by mouth every 6 (six) hours as needed for nausea or vomiting. 05/25/23   Ghimire, Werner Lean, MD  oxyCODONE (ROXICODONE) 5 MG immediate release tablet Take 1 tablet (5 mg total) by mouth every 6 (six) hours as needed for moderate pain. 05/25/23 05/24/24  GhimireWerner Lean, MD      Allergies    Patient has no known allergies.    Review of Systems   Review of Systems  Physical  Exam Updated Vital Signs BP 129/88 (BP Location: Left Arm)   Pulse 92   Temp 98.3 F (36.8 C) (Oral)   Resp 18   Ht 5\' 11"  (1.803 m)   Wt 85.5 kg   SpO2 100%   BMI 26.29 kg/m   Physical Exam Vitals and nursing note reviewed.  Constitutional:      General: He is not in acute distress.    Appearance: He is well-developed.  HENT:     Head: Normocephalic and atraumatic.  Eyes:     General:        Right eye: No discharge.        Left eye: No discharge.     Conjunctiva/sclera: Conjunctivae normal.  Cardiovascular:     Rate and Rhythm: Normal rate and regular rhythm.     Heart sounds: Normal heart sounds.  Pulmonary:     Effort: Pulmonary effort is normal.     Breath sounds: Normal breath sounds.  Abdominal:     Palpations: Abdomen is soft.     Tenderness: There is no abdominal tenderness.  Musculoskeletal:     Cervical back: Normal range of motion and neck supple.  Skin:    General: Skin is warm and dry.     Comments: Light erythema antecubital area R arm, small area of induration without fluctuance. Able to flex/extend at elbow with some pain. No lymphangitis.   Neurological:  Mental Status: He is alert.    ED Results / Procedures / Treatments   Labs (all labs ordered are listed, but only abnormal results are displayed) Labs Reviewed  CBC WITH DIFFERENTIAL/PLATELET - Abnormal; Notable for the following components:      Result Value   WBC 12.8 (*)    Monocytes Absolute 1.3 (*)    All other components within normal limits  BASIC METABOLIC PANEL - Abnormal; Notable for the following components:   Glucose, Bld 142 (*)    All other components within normal limits    EKG None  Radiology US Venous Img Upper Right (DVT Study)  Result Date: 05/27/2023 CLINICAL DATA:  Erythema and edema in the right antecubital fossa region with recent IV placement 1 week ago. EXAM: RIGHT UPPER EXTREMITY VENOUS DOPPLER ULTRASOUND TECHNIQUE: Gray-scale sonography with graded  compression, as well as color Doppler and duplex ultrasound were performed to evaluate the upper extremity deep venous system from the level of the subclavian vein and including the jugular, axillary, basilic, radial, ulnar and upper cephalic vein. Spectral Doppler was utilized to evaluate flow at rest and with distal augmentation maneuvers. COMPARISON:  None Available. FINDINGS: Contralateral Subclavian Vein: Respiratory phasicity is normal and symmetric with the symptomatic side. No evidence of thrombus. Normal compressibility. Internal Jugular Vein: No evidence of thrombus. Normal compressibility, respiratory phasicity and response to augmentation. Subclavian Vein: No evidence of thrombus. Normal compressibility, respiratory phasicity and response to augmentation. Axillary Vein: No evidence of thrombus. Normal compressibility, respiratory phasicity and response to augmentation. Cephalic Vein: Segmental superficial thrombophlebitis of the right cephalic vein is present in the antecubital fossa. Basilic Vein: Superficial thrombophlebitis extends into a segment of the right basilic vein in the upper arm just above the antecubital fossa. Brachial Veins: No evidence of thrombus. Normal compressibility, respiratory phasicity and response to augmentation. Radial Veins: No evidence of thrombus. Normal compressibility, respiratory phasicity and response to augmentation. Ulnar Veins: No evidence of thrombus. Normal compressibility, respiratory phasicity and response to augmentation. Venous Reflux:  None visualized. Other Findings:  None visualized. IMPRESSION: 1. No evidence of right upper extremity deep vein thrombosis. 2. Superficial thrombophlebitis of the right cephalic vein in the antecubital fossa and extending into a segment of the right basilic vein in the upper arm just above the antecubital fossa. Electronically Signed   By: Irish Lack M.D.   On: 05/27/2023 12:18    Procedures Procedures    Medications  Ordered in ED Medications - No data to display  ED Course/ Medical Decision Making/ A&P    Patient seen and examined. History obtained directly from patient and family member at bedside who gives additional background.  I reviewed patient's recent ED and hospitalization notes including discharge summary and past history.  Labs/EKG: Ordered CBC, BMP, right upper extremity DVT study.  Medications/Fluids: Ordered: None.   Most recent vital signs reviewed and are as follows: BP 129/88 (BP Location: Left Arm)   Pulse 92   Temp 98.3 F (36.8 C) (Oral)   Resp 18   Ht 5\' 11"  (1.803 m)   Wt 85.5 kg   SpO2 100%   BMI 26.29 kg/m   Initial impression: Suspect right upper extremity superficial thrombophlebitis, also considered mild cellulitis  12:51 PM Reassessment performed. Patient appears stable.  Patient has been discussed with and seen by Dr. Maple Hudson.  Labs personally reviewed and interpreted including: CBC with minimally elevated white blood cell count 12.8, similar to at discharge, BMP glucose 142 without evidence of ketosis,  otherwise no remarkable findings.  Imaging results reviewed including: Right upper extremity DVT study consistent with superficial thrombophlebitis  Reviewed pertinent lab work and imaging with patient at bedside. Questions answered.   Most current vital signs reviewed and are as follows: BP 129/88 (BP Location: Left Arm)   Pulse 92   Temp 98.3 F (36.8 C) (Oral)   Resp 18   Ht 5\' 11"  (1.803 m)   Wt 85.5 kg   SpO2 100%   BMI 26.29 kg/m   Plan: Discharge to home.   Prescriptions written for: Keflex p.o.  Other home care instructions discussed: Continued home pain meds, warm compresses, NSAIDs  ED return instructions discussed: New or worsening symptoms  Follow-up instructions discussed: Patient encouraged to follow-up with their PCP in 3-5 days for recheck and hospital follow-up.                                   Medical Decision Making Amount  and/or Complexity of Data Reviewed Labs: ordered.  Risk Prescription drug management.   Patient with arm redness and swelling after recent IV Angiocath placement.  No DVT.  Patient has superficial thrombophlebitis.  Cannot rule out mild associated cellulitis.  No abscess suspected.  Patient appears well, nontoxic.  White blood cell count is stable from recent discharge 2 days ago.  Patient discussed with and seen by attending physician.  No indication for further workup.        Final Clinical Impression(s) / ED Diagnoses Final diagnoses:  Superficial thrombophlebitis of right upper extremity    Rx / DC Orders ED Discharge Orders          Ordered    cephALEXin (KEFLEX) 500 MG capsule  4 times daily        05/27/23 1244              Renne Crigler, PA-C 05/27/23 1623    Coral Spikes, DO 05/28/23 [phone]

## 2023-05-28 LAB — CULTURE, BLOOD (ROUTINE X 2)
Culture: NO GROWTH
Culture: NO GROWTH
Special Requests: ADEQUATE
Special Requests: ADEQUATE

## 2023-06-04 ENCOUNTER — Emergency Department (HOSPITAL_BASED_OUTPATIENT_CLINIC_OR_DEPARTMENT_OTHER)
Admission: EM | Admit: 2023-06-04 | Discharge: 2023-06-04 | Disposition: A | Discharge status: Home / Self Care | Payer: BC Managed Care – PPO | Attending: Emergency Medicine | Admitting: Emergency Medicine

## 2023-06-04 ENCOUNTER — Emergency Department (HOSPITAL_BASED_OUTPATIENT_CLINIC_OR_DEPARTMENT_OTHER): Payer: BC Managed Care – PPO

## 2023-06-04 ENCOUNTER — Encounter (HOSPITAL_BASED_OUTPATIENT_CLINIC_OR_DEPARTMENT_OTHER): Payer: Self-pay

## 2023-06-04 DIAGNOSIS — M7989 Other specified soft tissue disorders: Secondary | ICD-10-CM | POA: Diagnosis not present

## 2023-06-04 DIAGNOSIS — I82611 Acute embolism and thrombosis of superficial veins of right upper extremity: Secondary | ICD-10-CM | POA: Diagnosis not present

## 2023-06-04 DIAGNOSIS — M79601 Pain in right arm: Secondary | ICD-10-CM | POA: Diagnosis not present

## 2023-06-04 DIAGNOSIS — I808 Phlebitis and thrombophlebitis of other sites: Secondary | ICD-10-CM | POA: Diagnosis not present

## 2023-06-04 DIAGNOSIS — G08 Intracranial and intraspinal phlebitis and thrombophlebitis: Secondary | ICD-10-CM | POA: Diagnosis not present

## 2023-06-04 DIAGNOSIS — I809 Phlebitis and thrombophlebitis of unspecified site: Secondary | ICD-10-CM

## 2023-06-04 MED ORDER — DOXYCYCLINE HYCLATE 100 MG PO CAPS: 100.0000 mg | ORAL_CAPSULE | Freq: Two times a day (BID) | ORAL | 0 refills | Status: DC

## 2023-06-04 MED ORDER — HYDROCODONE-ACETAMINOPHEN 5-325 MG PO TABS: 1.0000 | ORAL_TABLET | ORAL | 0 refills | Status: DC | PRN

## 2023-06-04 MED ORDER — CEPHALEXIN 500 MG PO CAPS: 500.0000 mg | ORAL_CAPSULE | Freq: Four times a day (QID) | ORAL | 0 refills | Status: AC

## 2023-06-04 NOTE — ED Triage Notes (Signed)
Pt reports to ED with complaints of arm pain on the right arm. Was recently admitted for sepsis and that he has severe pain in arm and is concerned for blood clots.

## 2023-06-04 NOTE — ED Provider Notes (Signed)
[name] Provider Note   CSN: [id number] Arrival date & time: 06/04/23  7829     History  Chief Complaint  Patient presents with   Arm Pain    Nathan Daugherty is a 32 y.o. male.  31 year old male who presents emergency department with right arm pain.  Patient was hospitalized approximately 2 weeks ago with concerns for possible sepsis.  He had an IV in his right arm.  Since then developed right arm pain and redness.  Came to the emergency department and was diagnosed with superficial thrombophlebitis.  Also had cellulitis and was started on Keflex which she is still taking.  Says he been taking ibuprofen with last dose this morning.  Says that he has 2 new areas of induration on his right forearm and now has erythema of his right forearm as opposed to his right medial arm which was present before.  Says that it worsened over the past 3 days.  Has some chills but no fevers.       Home Medications Prior to Admission medications   Medication Sig Start Date End Date Taking? Authorizing Provider  doxycycline (VIBRAMYCIN) 100 MG capsule Take 1 capsule (100 mg total) by mouth 2 (two) times daily. 06/04/23  Yes Rondel Baton, MD  HYDROcodone-acetaminophen (NORCO/VICODIN) 5-325 MG tablet Take 1 tablet by mouth every 4 (four) hours as needed. 06/04/23  Yes Rondel Baton, MD  cephALEXin (KEFLEX) 500 MG capsule Take 1 capsule (500 mg total) by mouth 4 (four) times daily for 5 days. 06/04/23 06/09/23  Rondel Baton, MD  dicyclomine (BENTYL) 20 MG tablet Take 1 tablet (20 mg total) by mouth 2 (two) times daily as needed for spasms. 05/25/23   Ghimire, Werner Lean, MD  escitalopram (LEXAPRO) 10 MG tablet Take 10 mg by mouth as needed (anxiety).    [provider]  Lacosamide (VIMPAT) 100 MG TABS Take 100 mg by mouth as needed (seizures).    [provider]  ondansetron (ZOFRAN) 4 MG tablet Take 1 tablet (4 mg total) by mouth  every 6 (six) hours as needed for nausea or vomiting. 05/25/23   Ghimire, Werner Lean, MD  oxyCODONE (ROXICODONE) 5 MG immediate release tablet Take 1 tablet (5 mg total) by mouth every 6 (six) hours as needed for moderate pain. 05/25/23 05/24/24  GhimireWerner Lean, MD      Allergies    Patient has no known allergies.    Review of Systems   Review of Systems  Physical Exam Updated Vital Signs BP 120/84   Pulse 86   Temp 97.9 F (36.6 C) (Oral)   Resp 18   Wt 86.2 kg   SpO2 97%   BMI 26.50 kg/m  Physical Exam Vitals and nursing note reviewed.  Constitutional:      General: He is not in acute distress.    Appearance: He is well-developed.  HENT:     Head: Normocephalic and atraumatic.     Right Ear: External ear normal.     Left Ear: External ear normal.     Nose: Nose normal.  Eyes:     Extraocular Movements: Extraocular movements intact.     Conjunctiva/sclera: Conjunctivae normal.     Pupils: Pupils are equal, round, and reactive to light.  Cardiovascular:     Rate and Rhythm: Normal rate and regular rhythm.     Heart sounds: Normal heart sounds.  Pulmonary:     Effort: Pulmonary effort  is normal. No respiratory distress.     Breath sounds: Normal breath sounds.  Musculoskeletal:     Cervical back: Normal range of motion and neck supple.     Right lower leg: No edema.     Left lower leg: No edema.     Comments: Full flexion of right elbow.  Extension limited due to discomfort.  Erythema of right anterior forearm.  See image below.  Skin:    General: Skin is warm and dry.  Neurological:     Mental Status: He is alert. Mental status is at baseline.  Psychiatric:        Mood and Affect: Mood normal.        Behavior: Behavior normal.    Right upper extremity:   ED Results / Procedures / Treatments   Labs (all labs ordered are listed, but only abnormal results are displayed) Labs Reviewed - No data to display  EKG None  Radiology US Venous Img Upper Uni  Right(DVT)  Result Date: 06/04/2023 CLINICAL DATA:  History of right upper extremity superficial venous thrombosis with increasing swelling and pain EXAM: RIGHT UPPER EXTREMITY VENOUS DOPPLER ULTRASOUND TECHNIQUE: Gray-scale sonography with graded compression, as well as color Doppler and duplex ultrasound were performed to evaluate the upper extremity deep venous system from the level of the subclavian vein and including the jugular, axillary, basilic, radial, ulnar and upper cephalic vein. Spectral Doppler was utilized to evaluate flow at rest and with distal augmentation maneuvers. COMPARISON:  Right upper extremity venous Doppler dated 05/27/2023 FINDINGS: Contralateral Subclavian Vein: Respiratory phasicity is normal and symmetric with the symptomatic side. No evidence of thrombus. Normal compressibility. Internal Jugular Vein: No evidence of thrombus. Normal compressibility, respiratory phasicity and response to augmentation. Subclavian Vein: No evidence of thrombus. Normal compressibility, respiratory phasicity and response to augmentation. Axillary Vein: No evidence of thrombus. Normal compressibility, respiratory phasicity and response to augmentation. Cephalic Vein: Noncompressible with increased extent of occlusive thrombus extending from the mid forearm to the antecubital fossa. Basilic Vein: Segmentally noncompressible with occlusive thrombus at the antecubital fossa. More proximal vein is patent, including previously noted basilic vein in the upper arm proximal to the antecubital fossa. Brachial Veins: No evidence of thrombus. Normal compressibility, respiratory phasicity and response to augmentation. Radial Veins: No evidence of thrombus. Normal compressibility, respiratory phasicity and response to augmentation. Ulnar Veins: No evidence of thrombus. Normal compressibility, respiratory phasicity and response to augmentation. Venous Reflux:  None visualized. Other Findings:  None visualized.  IMPRESSION: 1. No evidence of deep venous thrombosis in the right upper extremity. 2. Increased extent of occlusive superficial venous thrombosis in the right cephalic vein extending from the mid forearm to the antecubital fossa. 3. Slightly improved segmental occlusive thrombus within the basilic vein at the antecubital fossa. More proximal vein is patent, including previously noted occluded basilic vein in the upper arm proximal to the antecubital fossa. Electronically Signed   By: Agustin Cree M.D.   On: 06/04/2023 12:13    Procedures Procedures   EMERGENCY DEPARTMENT US EXTREMITY EXAM "Study:  Limited Duplex of right upper Extremity Veins"  INDICATIONS:  Arm pain Visualization of  regions in transverse plane with full compression visualized.   PERFORMED BY: Myself IMAGES ARCHIVED?: No VIEWS USED: Right forearm, right antecubital fossa, right basilic vein INTERPRETATION:  Superficial thrombophlebitis extending from the right forearm up to the distal right basilic vein just above the antecubital fossa.  No cellulitis or cobblestoning noted.  No abscess noted.  Medications Ordered in ED Medications - No data to display  ED Course/ Medical Decision Making/ A&P                                 Medical Decision Making Risk Prescription drug management.   DARLY KOS is a 32 y.o. male with comorbidities that complicate the patient evaluation including right upper extremity cellulitis who presents to the emergency department with right arm pain  Initial Ddx:  Cellulitis, septic thrombophlebitis, thrombophlebitis, DVT  MDM/Course:  Patient presents emergency department with right upper extremity pain.  Did have cellulitis and thrombophlebitis that had been treated with Keflex recently.  Appears that his symptoms have migrated to his proximal forearm at this time.  Does appear to have a thrombosed vein on point-of-care ultrasound.  Formal ultrasound was obtained that did not show any  evidence of DVT.  Does have some mild erythema at this time along the course of his veins and I suspect that his symptoms are likely due to septic thrombophlebitis caused by an IV he had previously.  Will go ahead and treat him with doxycycline in addition to his Keflex.  With formal ultrasound not showing DVT no indication for anticoagulation at this time.  Will have him follow-up with his primary doctor in several days regarding his symptoms.  Did give him a short course of Norco for his pain as well.  This patient presents to the ED for concern of complaints listed in HPI, this involves an extensive number of treatment options, and is a complaint that carries with it a high risk of complications and morbidity. Disposition including potential need for admission considered.   Dispo: DC Home. Return precautions discussed including, but not limited to, those listed in the AVS. Allowed pt time to ask questions which were answered fully prior to dc.  Records reviewed Outpatient Clinic Notes I have reviewed the patients home medications and made adjustments as needed   Final Clinical Impression(s) / ED Diagnoses Final diagnoses:  Septic thrombophlebitis  Pain of right upper extremity    Rx / DC Orders ED Discharge Orders          Ordered    cephALEXin (KEFLEX) 500 MG capsule  4 times daily        06/04/23 1224    doxycycline (VIBRAMYCIN) 100 MG capsule  2 times daily        06/04/23 1224    HYDROcodone-acetaminophen (NORCO/VICODIN) 5-325 MG tablet  Every 4 hours PRN        06/04/23 1233              Rondel Baton, MD 06/04/23 1335

## 2023-06-04 NOTE — Discharge Instructions (Addendum)
You were seen for your blood vessel inflammation and infection (thrombophlebitis) in the emergency department.   At home, please continue the Keflex and start taking the doxycycline we have prescribed you.  You may take Tylenol and ibuprofen for any pain and use warm compresses on your arm.  Please also take the hydrocodone for any breakthrough pain but do not take it before driving or operating heavy machinery.  Check your MyChart online for the results of any tests that had not resulted by the time you left the emergency department.   Follow-up with your primary doctor in 2-3 days regarding your visit.    Return immediately to the emergency department if you experience any of the following: Fevers, worsening pain, or any other concerning symptoms.    Thank you for visiting our Emergency Department. It was a pleasure taking care of you today.

## 2023-06-13 ENCOUNTER — Encounter (HOSPITAL_BASED_OUTPATIENT_CLINIC_OR_DEPARTMENT_OTHER): Payer: Self-pay

## 2023-06-13 ENCOUNTER — Emergency Department (HOSPITAL_BASED_OUTPATIENT_CLINIC_OR_DEPARTMENT_OTHER): Payer: BC Managed Care – PPO

## 2023-06-13 ENCOUNTER — Emergency Department (HOSPITAL_BASED_OUTPATIENT_CLINIC_OR_DEPARTMENT_OTHER)
Admission: EM | Admit: 2023-06-13 | Discharge: 2023-06-13 | Disposition: A | Discharge status: Home / Self Care | Payer: BC Managed Care – PPO | Attending: Emergency Medicine | Admitting: Emergency Medicine

## 2023-06-13 ENCOUNTER — Other Ambulatory Visit: Payer: Self-pay

## 2023-06-13 ENCOUNTER — Other Ambulatory Visit (HOSPITAL_BASED_OUTPATIENT_CLINIC_OR_DEPARTMENT_OTHER): Payer: Self-pay

## 2023-06-13 DIAGNOSIS — I82619 Acute embolism and thrombosis of superficial veins of unspecified upper extremity: Secondary | ICD-10-CM | POA: Diagnosis not present

## 2023-06-13 DIAGNOSIS — Z7901 Long term (current) use of anticoagulants: Secondary | ICD-10-CM | POA: Insufficient documentation

## 2023-06-13 DIAGNOSIS — I808 Phlebitis and thrombophlebitis of other sites: Secondary | ICD-10-CM

## 2023-06-13 DIAGNOSIS — I82611 Acute embolism and thrombosis of superficial veins of right upper extremity: Secondary | ICD-10-CM | POA: Insufficient documentation

## 2023-06-13 DIAGNOSIS — M79601 Pain in right arm: Secondary | ICD-10-CM | POA: Diagnosis not present

## 2023-06-13 MED ORDER — APIXABAN (ELIQUIS) VTE STARTER PACK (10MG AND 5MG)
ORAL_TABLET | ORAL | 0 refills | Status: DC
  Filled 2023-06-13: qty 74, 30d supply, fill #0

## 2023-06-13 MED ORDER — APIXABAN (ELIQUIS) EDUCATION KIT FOR DVT/PE PATIENTS: PACK | Freq: Once | Status: AC

## 2023-06-13 MED ORDER — HYDROCODONE-ACETAMINOPHEN 5-325 MG PO TABS: 1.0000 | ORAL_TABLET | ORAL | 0 refills | Status: DC | PRN

## 2023-06-13 MED ORDER — APIXABAN (ELIQUIS) VTE STARTER PACK (10MG AND 5MG): ORAL_TABLET | ORAL | 0 refills | Status: DC

## 2023-06-13 NOTE — Discharge Instructions (Addendum)
Begin taking Eliquis that we have prescribed, as directed on package start with two 5 mg tablets twice daily for 7 days, on day 8 switch to one 5 mg tablet twice daily.  It is crucial that if you have any significant injury or specifically head injury that you return to the ED to ensure that you do not develop a head bleed.  If you are using the narcotic pain medication we have prescribed make sure that you are also taking a excessive medication such as MiraLAX to help prevent constipation.  STARTED pack of blood thinner sent to [facility] pharmacy If you are having no significant improvement despite taking this medication I recommend that you follow-up with the vascular surgeon whose contact information provided.

## 2023-06-13 NOTE — ED Triage Notes (Signed)
The patient is here for right arm pain and swelling. He stated he it is not getting better. He is on antibiotics for phlebitis in that arm after his hospital stay.

## 2023-06-13 NOTE — ED Provider Notes (Signed)
[facility] Provider Note   CSN: [id number] Arrival date & time: 06/13/23  1243     History  Chief Complaint  Patient presents with   Arm Pain    MAHDI HALTIWANGER is a 32 y.o. male past medical history significant for chronic abdominal pain, IBS, autoimmune encephalitis who presents with ongoing right arm pain, swelling, and difficulty bending arm secondary to thrombophlebitis which was diagnosed after his recent hospital stay.  Patient initially treated with antibiotics secondary to suspected cellulitis, given superficial nature, has not been placed on any blood thinner.  He has been applying warm compresses, completed the course of antibiotics and feels if anything the pain is worsening.  Has not had follow-up with vascular surgeon.   Arm Pain       Home Medications Prior to Admission medications   Medication Sig Start Date End Date Taking? Authorizing Provider  APIXABAN (ELIQUIS) VTE STARTER PACK (10MG  AND 5MG ) Take as directed on package: start with two-5mg  tablets twice daily for 7 days. On day 8, switch to one-5mg  tablet twice daily. 06/13/23   Daelynn Blower H, PA-C  dicyclomine (BENTYL) 20 MG tablet Take 1 tablet (20 mg total) by mouth 2 (two) times daily as needed for spasms. 05/25/23   Ghimire, Werner Lean, MD  doxycycline (VIBRAMYCIN) 100 MG capsule Take 1 capsule (100 mg total) by mouth 2 (two) times daily. 06/04/23   Rondel Baton, MD  escitalopram (LEXAPRO) 10 MG tablet Take 10 mg by mouth as needed (anxiety).    [provider]  HYDROcodone-acetaminophen (NORCO/VICODIN) 5-325 MG tablet Take 1 tablet by mouth every 4 (four) hours as needed. 06/13/23   Vito Beg H, PA-C  Lacosamide (VIMPAT) 100 MG TABS Take 100 mg by mouth as needed (seizures).    [provider]  ondansetron (ZOFRAN) 4 MG tablet Take 1 tablet (4 mg total) by mouth every 6 (six) hours as needed for nausea or vomiting. 05/25/23    Ghimire, Werner Lean, MD  oxyCODONE (ROXICODONE) 5 MG immediate release tablet Take 1 tablet (5 mg total) by mouth every 6 (six) hours as needed for moderate pain. 05/25/23 05/24/24  GhimireWerner Lean, MD      Allergies    Patient has no known allergies.    Review of Systems   Review of Systems  All other systems reviewed and are negative.   Physical Exam Updated Vital Signs BP 132/84   Pulse 76   Temp 98.5 F (36.9 C) (Oral)   Resp 16   Ht 5\' 11"  (1.803 m)   Wt 86 kg   SpO2 100%   BMI 26.44 kg/m  Physical Exam Vitals and nursing note reviewed.  Constitutional:      General: He is not in acute distress.    Appearance: Normal appearance.  HENT:     Head: Normocephalic and atraumatic.  Eyes:     General:        Right eye: No discharge.        Left eye: No discharge.  Cardiovascular:     Rate and Rhythm: Normal rate and regular rhythm.  Pulmonary:     Effort: Pulmonary effort is normal. No respiratory distress.  Musculoskeletal:        General: No deformity.     Comments: palpable cord noted in proximal forearm, patient with difficulty bending the right arm secondary to pain, sensation of pulling, no redness or swelling around the elbow itself  Skin:  General: Skin is warm and dry.  Neurological:     Mental Status: He is alert and oriented to person, place, and time.  Psychiatric:        Mood and Affect: Mood normal.        Behavior: Behavior normal.   consult   ED Results / Procedures / Treatments   Labs (all labs ordered are listed, but only abnormal results are displayed) Labs Reviewed - No data to display  EKG None  Radiology US Venous Img Upper Right (DVT Study)  Result Date: 06/13/2023 CLINICAL DATA:  Follow-up right basilic and cephalic vein superficial thrombophlebitis. EXAM: RIGHT UPPER EXTREMITY VENOUS DOPPLER ULTRASOUND TECHNIQUE: Gray-scale sonography with graded compression, as well as color Doppler and duplex ultrasound were performed to  evaluate the upper extremity deep venous system from the level of the subclavian vein and including the jugular, axillary, basilic, radial, ulnar and upper cephalic vein. Spectral Doppler was utilized to evaluate flow at rest and with distal augmentation maneuvers. COMPARISON:  Right upper extremity venous Doppler ultrasound-06/04/2023 (positive for occlusive superficial thrombophlebitis involving the right cephalic vein; positive for occlusive thrombus within the basilic vein at the level of the antecubital fossa) FINDINGS: Contralateral Subclavian Vein: Respiratory phasicity is normal and symmetric with the symptomatic side. No evidence of thrombus. Normal compressibility. Internal Jugular Vein: No evidence of thrombus. Normal compressibility, respiratory phasicity and response to augmentation. Subclavian Vein: No evidence of thrombus. Normal compressibility, respiratory phasicity and response to augmentation. Axillary Vein: No evidence of thrombus. Normal compressibility, respiratory phasicity and response to augmentation. Cephalic Vein: While the cephalic vein appears patent proximally, there is unchanged mixed echogenic occlusive thrombus within the cephalic vein at the level of the elbow (image 35), extending to the level of the forearm (image 41), unchanged compared to the 06/04/2023 examination. The cephalic vein appears patent at the level of the wrist (image 45). Basilic Vein: While the basilic vein appears patent proximally (image 24), there is unchanged hypoechoic occlusive thrombus within the basilic vein at the level of the distal humerus/antecubital fossa (image 25), unchanged compared to the 06/04/2023 examination. Brachial Veins: No evidence of thrombus. Normal compressibility, respiratory phasicity and response to augmentation. Radial Veins: No evidence of thrombus. Normal compressibility, respiratory phasicity and response to augmentation. Ulnar Veins: No evidence of thrombus. Normal  compressibility, respiratory phasicity and response to augmentation. Other Findings:  None visualized. IMPRESSION: 1. No evidence acute or chronic DVT within the right upper extremity. 2. No evidence of acute or worsening superficial thrombophlebitis within the right upper extremity. 3. 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 Electronically Signed   By: Simonne Come M.D.   On: 06/13/2023 14:51    Procedures Procedures    Medications Ordered in ED Medications  apixaban Everlene Balls) Education Kit for DVT/PE patients ( Does not apply Given 06/13/23 1545)    ED Course/ Medical Decision Making/ A&P                                 Medical Decision Making  This patient is a 32 y.o. male  who presents to the ED for concern of ongoing right arm pain, swelling.   Differential diagnoses prior to evaluation: The emergent differential diagnosis includes, but is  not limited to, worsening thrombophlebitis, cellulitis, developing septic arthritis, developing DVT, versus other. This is not an exhaustive differential.   Past Medical History / Co-morbidities / Social History: IBD, autoimmune encephalitis, superficial thrombophlebitis  Additional history: Chart reviewed. Pertinent results include: Ongoing right arm pain, recently diagnosed superficial thrombophlebitis, patient reports no significant improvement, difficult to bend arm  Physical Exam: Physical exam performed. The pertinent findings include:  palpable cord noted in proximal forearm, patient with difficulty bending the right arm secondary to pain, sensation of pulling, no redness or swelling around the elbow itself  Radial, ulnar pulses are 2+ in the affected right upper extremity  Lab Tests/Imaging  studies: I personally interpreted labs/imaging and the pertinent results include: I independently interpreted right upper extremity DVT study which shows evidence of thrombophlebitis with no change today compared to recent previous. I agree with the radiologist interpretation.  Consults: Spoke with the vascular surgeon, Dr. Lenell Antu, who after discussion of patient's physical exam findings, lack of improvement recommended treatment with Eliquis, as well as vascular surgery follow-up if no improvement, although likely no additional recommendations other than continued compression, time, and treatment with the anticoagulation.   Medications: I ordered medication including Eliquis starter kit, patient initially discharged with short course of pain medication due to the severity of his ongoing pain, light duty work note provided.  I have reviewed the patients home medicines and have made adjustments as needed.   Disposition: After consideration of the diagnostic results and the patients response to treatment, I feel that patient is stable for discharge with plan as above.   emergency department workup does not suggest an emergent condition requiring admission or immediate intervention beyond what has been performed at this time. The plan is: as above. The patient is safe for discharge and has been instructed to return immediately for worsening symptoms, change in symptoms or any other concerns.  Final Clinical Impression(s) / ED Diagnoses Final diagnoses:  Superficial thrombophlebitis of right upper extremity    Rx / DC Orders ED Discharge Orders          Ordered    APIXABAN (ELIQUIS) VTE STARTER PACK (10MG  AND 5MG )  Status:  Discontinued       Note to Pharmacy: If starter pack unavailable, substitute with seventy-four 5 mg apixaban tabs following the above SIG directions.   06/13/23 1512    HYDROcodone-acetaminophen (NORCO/VICODIN) 5-325 MG tablet  Every 4 hours PRN        06/13/23 1517     APIXABAN (ELIQUIS) VTE STARTER PACK (10MG  AND 5MG )       Note to Pharmacy: If starter pack unavailable, substitute with seventy-four 5 mg apixaban tabs following the above SIG directions.   06/13/23 1524              Malashia Kamaka, [place] H, PA-C 06/13/23 1558    Terald Sleeper, MD 06/14/23 [phone]

## 2023-06-27 ENCOUNTER — Ambulatory Visit (INDEPENDENT_AMBULATORY_CARE_PROVIDER_SITE_OTHER): Payer: BC Managed Care – PPO | Admitting: Physician Assistant

## 2023-06-27 ENCOUNTER — Encounter: Payer: Self-pay | Admitting: Vascular Surgery

## 2023-06-27 VITALS — BP 148/85 | HR 102 | Temp 98.2°F | Ht 72.0 in | Wt 189.7 lb

## 2023-06-27 DIAGNOSIS — I808 Phlebitis and thrombophlebitis of other sites: Secondary | ICD-10-CM

## 2023-06-27 NOTE — Progress Notes (Signed)
Referring Physician: Anderson Malta; Luther Hearing PA-C  Patient name: Nathan Daugherty MRN: [id number] DOB: Aug 04, 1991 Sex: male  REASON FOR CONSULT: F/u from ED visit   HPI: Nathan Daugherty is a 32 y.o. male with past medical history including chronic headaches, IBD with chronic abdominal pain, and Autoimmune encephalitis followed at [facility]. He had presented to the ER on 05/27/23 with concerns of swelling and redness in his right AC fossa. He was hospitalized several days prior to this for pneumonia and had IV access in his right arm in this area. He was diagnosed with right upper extremity superficial thrombophlebitis and cellulitis. He was given Keflex and discharged home with instructions for NSAIDs and warm compresses. He then returned to ED with worsening redness and discomfort in the arm with subjective chills but no fever. Duplex was repeated showing superficial thrombosis of the distal right basilic vein at the antecubital fossa. No DVT was seen. He was then started on Doxycycline in addition to the keflex. He was again discharged home. On 06/13/23 he presented again to ED with worsening right arm pain, swelling and redness despite antibiotics and conservative therapy. Due to his continued symptoms he was started on Eliquis.   Today he reports arm is much better then it was. He still has limited extension of his right arm. Gets a tight/ pulling feeling. Some tenderness still at the antecubitum. He reports that he is back to working light duty and has been trying to use his right arm both at work and at home and has had several instances of significant pain with any lifting or sudden movements. 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 He has been mostly using left arm and doing limited overall lifting. He is also father of 3 with youngest being a 96 month old so he has been trying to do as much as he can within the restrictions of his discomfort. He has  been doing warm compresses, wearing compression sleeve on arm and taking Ibuprofen. He has been compliant with his Eliquis.   Past Medical History:  Diagnosis Date   Colitis, Clostridium difficile 10/25/2016   Disease of brain    inflammatory brain disease autoimmune related   IBD (inflammatory bowel disease)    presumed Autoimmune encephalitis    Seizure-like activity (HCC)    Past Surgical History:  Procedure Laterality Date   HYPOSPADIAS CORRECTION      Family History  Problem Relation Age of Onset   Breast cancer Mother    Diabetes Mellitus II Maternal Grandmother    Breast cancer Maternal Grandmother    Lung cancer Maternal Grandfather     SOCIAL HISTORY: Social History   Socioeconomic History   Marital status: Married    Spouse name: Not on file   Number of children: Not on file   Years of education: Not on file   Highest education level: Not on file  Occupational History   Not on file  Tobacco Use   Smoking status: Former    Current packs/day: 0.50    Types: Cigarettes   Smokeless tobacco: Never  Vaping Use   Vaping status: Every Day   Start date: 08/08/2019   Substances: Nicotine  Substance and Sexual Activity   Alcohol use: Not Currently    Comment: occ   Drug use: No   Sexual activity: Yes  Other Topics Concern   Not on file  Social History Narrative   Not  on file   Social Determinants of Health   Financial Resource Strain: Medium Risk (08/06/2022)   Received from [facility], [facility]   Overall Financial Resource Strain (CARDIA)    Difficulty of Paying Living Expenses: Somewhat hard  Food Insecurity: No Food Insecurity (05/24/2023)   Hunger Vital Sign    Worried About Running Out of Food in the Last Year: Never true    Ran Out of Food in the Last Year: Never true  Transportation Needs: No Transportation Needs (05/24/2023)   PRAPARE - Administrator, Civil Service (Medical): No    Lack of  Transportation (Non-Medical): No  Physical Activity: Not on file  Stress: No Stress Concern Present (02/19/2021)   Received from [facility], [facility] Occup[facility] - Occupational Stress Questionnaire    Feeling of Stress : Only a little  Social Connections: Unknown (02/18/2022)   Received from [facility], [facility]   Social Network    Social Network: Not on file  Intimate Partner Violence: Not At Risk (05/24/2023)   Humiliation, Afraid, Rape, and Kick questionnaire    Fear of Current or Ex-Partner: No    Emotionally Abused: No    Physically Abused: No    Sexually Abused: No    No Known Allergies  Current Outpatient Medications  Medication Sig Dispense Refill   APIXABAN (ELIQUIS) VTE STARTER PACK (10MG  AND 5MG ) Take as directed on package: start with two-5mg  tablets twice daily for 7 days. On day 8, switch to one-5mg  tablet twice daily. 74 each 0   dicyclomine (BENTYL) 20 MG tablet Take 1 tablet (20 mg total) by mouth 2 (two) times daily as needed for spasms. 20 tablet 0   escitalopram (LEXAPRO) 10 MG tablet Take 10 mg by mouth as needed (anxiety).     Lacosamide (VIMPAT) 100 MG TABS Take 100 mg by mouth as needed (seizures).     doxycycline (VIBRAMYCIN) 100 MG capsule Take 1 capsule (100 mg total) by mouth 2 (two) times daily. (Patient not taking: Reported on 06/27/2023) 20 capsule 0   HYDROcodone-acetaminophen (NORCO/VICODIN) 5-325 MG tablet Take 1 tablet by mouth every 4 (four) hours as needed. (Patient not taking: Reported on 06/27/2023) 15 tablet 0   ondansetron (ZOFRAN) 4 MG tablet Take 1 tablet (4 mg total) by mouth every 6 (six) hours as needed for nausea or vomiting. (Patient not taking: Reported on 06/27/2023) 12 tablet 0   oxyCODONE (ROXICODONE) 5 MG immediate release tablet Take 1 tablet (5 mg total) by mouth every 6 (six) hours as needed for moderate pain. (Patient not taking: Reported on 06/27/2023) 15 tablet 0   No current  facility-administered medications for this visit.    ROS:   General:  No weight loss, Fever, chills  HEENT: No recent headaches, no nasal bleeding, no visual changes, no sore throat  Neurologic: No dizziness, blackouts, seizures. No recent symptoms of stroke or mini- stroke. No recent episodes of slurred speech, or temporary blindness.  Cardiac: having some chest discomfort on left side since yesterday, at times sharp. Some associated shortness of breath. No shortness of breath with exertion.  Denies history of atrial fibrillation or irregular heartbeat  Vascular: No history of rest pain in feet.  No history of claudication.  No history of non-healing ulcer, No history of DVT   Pulmonary: No home oxygen, no productive cough, no hemoptysis,  No asthma or wheezing  Musculoskeletal:  [ ]  Arthritis, [ ]   Low back pain,  [ ]  Joint pain  Hematologic:No history of hypercoagulable state.  No history of easy bleeding.  No history of anemia  Gastrointestinal: No hematochezia or melena,  No gastroesophageal reflux, no trouble swallowing  Urinary: [ ]  chronic Kidney disease, [ ]  on HD - [ ]  MWF or [ ]  TTHS, [ ]  Burning with urination, [ ]  Frequent urination, [ ]  Difficulty urinating;   Skin: No rashes  Psychological: No history of anxiety,  No history of depression   Physical Examination  Vitals:   06/27/23 1005  BP: (!) 148/85  Pulse: (!) 102  Temp: 98.2 F (36.8 C)  TempSrc: Temporal  SpO2: 96%  Weight: 189 lb 11.2 oz (86 kg)  Height: 6' (1.829 m)    Body mass index is 25.73 kg/m.  General:  Alert and oriented, no acute distress, WDWN male HEENT: Normal Neck: No bruit or JVD Pulmonary: non labored Cardiac: Tachycardic, regular rhythm Abdomen: Soft Skin: No rash Extremity Pulses:  2+ radial, brachial pulses, right hand warm. No erythema of right forearm or upper arm. There is palpable firm cord at [facility] and extending into proximal forearm and distal upper arm. Tenderness  present. Unable to fully extend arm without discomfort Musculoskeletal: No deformity or edema  Neurologic: alert and oriented   ASSESSMENT:  superficial thrombophlebitis of right cephalic and basilic veins  PLAN:   - Provided reassurance and explained that it can take several weeks to months for resolution of his thrombophlebitis - Continue Eliquis  - Can continue to use warm compresses as needed and compression sleeve - NSAIDS for pain relief - I have recommended continued light duty for 3 more weeks as he still is having some significant discomfort and limited ROM in the right arm. I discussed from medical standpoint he is okay to resume any and all activity however with his work requirements for lifting and his current level of discomfort it would be best to give himself several more weeks to . He can call if his arm continues to improve and we can reassesses his readiness to return to work without restrictions  - He can follow up as needed if he has any new or concerning symptoms   Nathanial Rancher, PA-C [facility] Office: [phone]

## 2023-07-02 DIAGNOSIS — G0481 Other encephalitis and encephalomyelitis: Secondary | ICD-10-CM | POA: Diagnosis not present

## 2023-07-08 DIAGNOSIS — G0481 Other encephalitis and encephalomyelitis: Secondary | ICD-10-CM | POA: Diagnosis not present

## 2023-07-22 DIAGNOSIS — G0481 Other encephalitis and encephalomyelitis: Secondary | ICD-10-CM | POA: Diagnosis not present

## 2023-11-04 DIAGNOSIS — N5089 Other specified disorders of the male genital organs: Secondary | ICD-10-CM | POA: Diagnosis not present

## 2023-11-04 DIAGNOSIS — G43109 Migraine with aura, not intractable, without status migrainosus: Secondary | ICD-10-CM | POA: Diagnosis not present

## 2023-12-10 ENCOUNTER — Encounter (HOSPITAL_BASED_OUTPATIENT_CLINIC_OR_DEPARTMENT_OTHER): Payer: Self-pay

## 2023-12-10 ENCOUNTER — Emergency Department (HOSPITAL_BASED_OUTPATIENT_CLINIC_OR_DEPARTMENT_OTHER)
Admission: EM | Admit: 2023-12-10 | Discharge: 2023-12-10 | Disposition: A | Discharge status: Home / Self Care | Attending: Emergency Medicine | Admitting: Emergency Medicine

## 2023-12-10 ENCOUNTER — Emergency Department (HOSPITAL_BASED_OUTPATIENT_CLINIC_OR_DEPARTMENT_OTHER)

## 2023-12-10 DIAGNOSIS — U071 COVID-19: Secondary | ICD-10-CM | POA: Diagnosis not present

## 2023-12-10 DIAGNOSIS — Z7901 Long term (current) use of anticoagulants: Secondary | ICD-10-CM | POA: Diagnosis not present

## 2023-12-10 DIAGNOSIS — R109 Unspecified abdominal pain: Secondary | ICD-10-CM | POA: Diagnosis not present

## 2023-12-10 DIAGNOSIS — R1032 Left lower quadrant pain: Secondary | ICD-10-CM | POA: Diagnosis not present

## 2023-12-10 DIAGNOSIS — R112 Nausea with vomiting, unspecified: Secondary | ICD-10-CM | POA: Diagnosis not present

## 2023-12-10 LAB — URINALYSIS, ROUTINE W REFLEX MICROSCOPIC
Bilirubin Urine: NEGATIVE
Glucose, UA: 100 mg/dL — AB
Ketones, ur: NEGATIVE mg/dL
Leukocytes,Ua: NEGATIVE
Nitrite: NEGATIVE
Protein, ur: NEGATIVE mg/dL
Specific Gravity, Urine: 1.01 (ref 1.005–1.030)
pH: 6.5 (ref 5.0–8.0)

## 2023-12-10 LAB — CBC
HCT: 43.2 % (ref 39.0–52.0)
Hemoglobin: 15.2 g/dL (ref 13.0–17.0)
MCH: 29 pg (ref 26.0–34.0)
MCHC: 35.2 g/dL (ref 30.0–36.0)
MCV: 82.4 fL (ref 80.0–100.0)
Platelets: 325 10*3/uL (ref 150–400)
RBC: 5.24 MIL/uL (ref 4.22–5.81)
RDW: 12.8 % (ref 11.5–15.5)
WBC: 16.2 10*3/uL — ABNORMAL HIGH (ref 4.0–10.5)
nRBC: 0 % (ref 0.0–0.2)

## 2023-12-10 LAB — COMPREHENSIVE METABOLIC PANEL
ALT: 30 U/L (ref 0–44)
AST: 22 U/L (ref 15–41)
Albumin: 4.2 g/dL (ref 3.5–5.0)
Alkaline Phosphatase: 79 U/L (ref 38–126)
Anion gap: 7 (ref 5–15)
BUN: 7 mg/dL (ref 6–20)
CO2: 22 mmol/L (ref 22–32)
Calcium: 9.2 mg/dL (ref 8.9–10.3)
Chloride: 105 mmol/L (ref 98–111)
Creatinine, Ser: 0.9 mg/dL (ref 0.61–1.24)
GFR, Estimated: >60 mL/min (ref >60)
Glucose, Bld: 86 mg/dL (ref 70–99)
Potassium: 3.7 mmol/L (ref 3.5–5.1)
Sodium: 134 mmol/L — ABNORMAL LOW (ref 135–145)
Total Bilirubin: 0.5 mg/dL (ref 0.0–1.2)
Total Protein: 7.4 g/dL (ref 6.5–8.1)

## 2023-12-10 LAB — RESP PANEL BY RT-PCR (RSV, FLU A&B, COVID)  RVPGX2
Influenza A by PCR: NEGATIVE
Influenza B by PCR: NEGATIVE
Resp Syncytial Virus by PCR: NEGATIVE
SARS Coronavirus 2 by RT PCR: POSITIVE — AB

## 2023-12-10 LAB — LIPASE, BLOOD: Lipase: 28 U/L (ref 11–51)

## 2023-12-10 LAB — URINALYSIS, MICROSCOPIC (REFLEX)

## 2023-12-10 LAB — LACTIC ACID, PLASMA: Lactic Acid, Venous: 1.5 mmol/L (ref 0.5–1.9)

## 2023-12-10 MED ORDER — ONDANSETRON 4 MG PO TBDP: 4.0000 mg | ORAL_TABLET | Freq: Four times a day (QID) | ORAL | 0 refills | Status: DC | PRN

## 2023-12-10 MED ORDER — MORPHINE SULFATE (PF) 4 MG/ML IV SOLN
4.0000 mg | Freq: Once | INTRAVENOUS | Status: AC
  Administered 2023-12-10: 4 mg via INTRAVENOUS
  Filled 2023-12-10: qty 1

## 2023-12-10 MED ORDER — DICYCLOMINE HCL 10 MG PO CAPS
10.0000 mg | ORAL_CAPSULE | Freq: Once | ORAL | Status: AC
  Administered 2023-12-10: 10 mg via ORAL
  Filled 2023-12-10: qty 1

## 2023-12-10 MED ORDER — SODIUM CHLORIDE 0.9 % IV BOLUS
1000.0000 mL | Freq: Once | INTRAVENOUS | Status: AC
  Administered 2023-12-10: 1000 mL via INTRAVENOUS

## 2023-12-10 MED ORDER — HYDROMORPHONE HCL 1 MG/ML IJ SOLN
1.0000 mg | Freq: Once | INTRAMUSCULAR | Status: AC
  Administered 2023-12-10: 1 mg via INTRAVENOUS
  Filled 2023-12-10: qty 1

## 2023-12-10 MED ORDER — IOHEXOL 300 MG/ML  SOLN
100.0000 mL | Freq: Once | INTRAMUSCULAR | Status: AC | PRN
  Administered 2023-12-10: 100 mL via INTRAVENOUS

## 2023-12-10 MED ORDER — OXYCODONE-ACETAMINOPHEN 5-325 MG PO TABS: 1.0000 | ORAL_TABLET | Freq: Four times a day (QID) | ORAL | 0 refills | Status: DC | PRN

## 2023-12-10 MED ORDER — ONDANSETRON HCL 4 MG/2ML IJ SOLN
4.0000 mg | Freq: Once | INTRAMUSCULAR | Status: AC
  Administered 2023-12-10: 4 mg via INTRAVENOUS
  Filled 2023-12-10: qty 2

## 2023-12-10 NOTE — Discharge Instructions (Addendum)
 Please use Tylenol or ibuprofen for pain.  You may use 600 mg ibuprofen every 6 hours or 1000 mg of Tylenol every 6 hours.  You may choose to alternate between the 2.  This would be most effective.  Not to exceed 4 g of Tylenol within 24 hours.  Not to exceed 3200 mg ibuprofen 24 hours.  Use nausea medication and Bentyl as needed for your abdominal pain, nausea, vomiting, you can use the stronger narcotic pain medication as needed for severe breakthrough pain.

## 2023-12-10 NOTE — ED Provider Notes (Signed)
 [place] [facility] Provider Note   CSN: [id number] Arrival date & time: 12/10/23  4098     History  Chief Complaint  Patient presents with   Abdominal Pain    TESEAN STUMP is a 33 y.o. male with past medical history significant for inflammatory bowel disease, autoimmune encephalitis, thrombophlebitis, Behcet's disease, who presents with severe abdominal pain, diarrhea, nausea, congestion, cough, nosebleeds.  Reports chills at home.  Reports some recent sick contact, daughter recently with flu.  Rates pain 9/10.   Abdominal Pain      Home Medications Prior to Admission medications   Medication Sig Start Date End Date Taking? Authorizing Provider  APIXABAN (ELIQUIS) VTE STARTER PACK (10MG  AND 5MG ) Take as directed on package: start with two-5mg  tablets twice daily for 7 days. On day 8, switch to one-5mg  tablet twice daily. 06/13/23   Carleena Mires H, PA-C  dicyclomine (BENTYL) 20 MG tablet Take 1 tablet (20 mg total) by mouth 2 (two) times daily as needed for spasms. 05/25/23   Ghimire, Werner Lean, MD  doxycycline (VIBRAMYCIN) 100 MG capsule Take 1 capsule (100 mg total) by mouth 2 (two) times daily. Patient not taking: Reported on 06/27/2023 06/04/23   Rondel Baton, MD  escitalopram (LEXAPRO) 10 MG tablet Take 10 mg by mouth as needed (anxiety).    [provider]  HYDROcodone-acetaminophen (NORCO/VICODIN) 5-325 MG tablet Take 1 tablet by mouth every 4 (four) hours as needed. Patient not taking: Reported on 06/27/2023 06/13/23   Alaja Goldinger H, PA-C  Lacosamide (VIMPAT) 100 MG TABS Take 100 mg by mouth as needed (seizures).    [provider]  ondansetron (ZOFRAN) 4 MG tablet Take 1 tablet (4 mg total) by mouth every 6 (six) hours as needed for nausea or vomiting. Patient not taking: Reported on 06/27/2023 05/25/23   Maretta Bees, MD  ondansetron (ZOFRAN-ODT) 4 MG disintegrating tablet Take 1 tablet (4 mg  total) by mouth every 6 (six) hours as needed for nausea or vomiting. 12/10/23   Daniil Labarge H, PA-C  oxyCODONE (ROXICODONE) 5 MG immediate release tablet Take 1 tablet (5 mg total) by mouth every 6 (six) hours as needed for moderate pain. Patient not taking: Reported on 06/27/2023 05/25/23 05/24/24  Maretta Bees, MD  oxyCODONE-acetaminophen (PERCOCET/ROXICET) 5-325 MG tablet Take 1 tablet by mouth every 6 (six) hours as needed for severe pain (pain score 7-10). 12/10/23   Eliora Nienhuis H, PA-C      Allergies    Patient has no known allergies.    Review of Systems   Review of Systems  Gastrointestinal:  Positive for abdominal pain.  All other systems reviewed and are negative.   Physical Exam Updated Vital Signs BP 117/78   Pulse 83   Temp 98.7 F (37.1 C) (Oral)   Resp 15   Ht 5\' 11"  (1.803 m)   Wt 86.2 kg   SpO2 96%   BMI 26.50 kg/m  Physical Exam Vitals and nursing note reviewed.  Constitutional:      General: He is not in acute distress.    Appearance: Normal appearance.  HENT:     Head: Normocephalic and atraumatic.  Eyes:     General:        Right eye: No discharge.        Left eye: No discharge.  Cardiovascular:     Rate and Rhythm: Normal rate and regular rhythm.     Heart sounds: No murmur  heard.    No friction rub. No gallop.  Pulmonary:     Effort: Pulmonary effort is normal.     Breath sounds: Normal breath sounds.  Abdominal:     General: Bowel sounds are normal.     Palpations: Abdomen is soft.     Comments: Focal tenderness to palpation the left lower quadrant with some guarding, no rebound, rigidity.  Diffuse tenderness throughout the rest of the abdomen.  No distention.  Skin:    General: Skin is warm and dry.     Capillary Refill: Capillary refill takes less than 2 seconds.  Neurological:     Mental Status: He is alert and oriented to person, place, and time.  Psychiatric:        Mood and Affect: Mood normal.        Behavior:  Behavior normal.     ED Results / Procedures / Treatments   Labs (all labs ordered are listed, but only abnormal results are displayed) Labs Reviewed  RESP PANEL BY RT-PCR (RSV, FLU A&B, COVID)  RVPGX2 - Abnormal; Notable for the following components:      Result Value   SARS Coronavirus 2 by RT PCR POSITIVE (*)    All other components within normal limits  COMPREHENSIVE METABOLIC PANEL - Abnormal; Notable for the following components:   Sodium 134 (*)    All other components within normal limits  CBC - Abnormal; Notable for the following components:   WBC 16.2 (*)    All other components within normal limits  URINALYSIS, ROUTINE W REFLEX MICROSCOPIC - Abnormal; Notable for the following components:   Glucose, UA 100 (*)    Hgb urine dipstick TRACE (*)    All other components within normal limits  URINALYSIS, MICROSCOPIC (REFLEX) - Abnormal; Notable for the following components:   Bacteria, UA RARE (*)    All other components within normal limits  LIPASE, BLOOD  LACTIC ACID, PLASMA    EKG None  Radiology CT ABDOMEN PELVIS W CONTRAST Result Date: 12/10/2023 CLINICAL DATA:  Abdominal pain, acute, nonlocalized, nausea and vomiting for 2 days, COVID positive EXAM: CT ABDOMEN AND PELVIS WITH CONTRAST TECHNIQUE: Multidetector CT imaging of the abdomen and pelvis was performed using the standard protocol following bolus administration of intravenous contrast. RADIATION DOSE REDUCTION: This exam was performed according to the departmental dose-optimization program which includes automated exposure control, adjustment of the mA and/or kV according to patient size and/or use of iterative reconstruction technique. CONTRAST:  OMNIPAQUE IOHEXOL 300 MG/ML  SOLN COMPARISON:  05/23/2023 FINDINGS: Lower chest: Minor bibasilar atelectasis. Normal heart size. No pericardial or pleural effusion. No acute lower chest process. Hepatobiliary: No focal liver abnormality is seen. No gallstones,  gallbladder wall thickening, or biliary dilatation. Pancreas: Unremarkable. No pancreatic ductal dilatation or surrounding inflammatory changes. Spleen: Normal in size without focal abnormality. Adrenals/Urinary Tract: Adrenal glands are unremarkable. Kidneys are normal, without renal calculi, focal lesion, or hydronephrosis. Bladder is unremarkable. Stomach/Bowel: Stomach is within normal limits. Appendix appears normal. No evidence of bowel wall thickening, distention, or inflammatory changes. Vascular/Lymphatic: No significant vascular findings are present. No enlarged abdominal or pelvic lymph nodes. Reproductive: No significant finding by CT Other: No abdominal wall hernia or abnormality. No abdominopelvic ascites. Musculoskeletal: No acute or significant osseous findings. IMPRESSION: No acute intra-abdominal or pelvic finding by CT. Electronically Signed   By: Judie Petit.  Shick M.D.   On: 12/10/2023 14:34    Procedures Procedures    Medications Ordered in ED Medications  morphine (PF) 4 MG/ML injection 4 mg (4 mg Intravenous Given 12/10/23 1048)  ondansetron (ZOFRAN) injection 4 mg (4 mg Intravenous Given 12/10/23 1048)  sodium chloride 0.9 % bolus 1,000 mL (0 mLs Intravenous Stopped 12/10/23 1217)  iohexol (OMNIPAQUE) 300 MG/ML solution 100 mL (100 mLs Intravenous Contrast Given 12/10/23 1150)  morphine (PF) 4 MG/ML injection 4 mg (4 mg Intravenous Given 12/10/23 1145)  dicyclomine (BENTYL) capsule 10 mg (10 mg Oral Given 12/10/23 1145)  HYDROmorphone (DILAUDID) injection 1 mg (1 mg Intravenous Given 12/10/23 1243)    ED Course/ Medical Decision Making/ A&P                                 Medical Decision Making Amount and/or Complexity of Data Reviewed Labs: ordered. Radiology: ordered.  Risk Prescription drug management.   This patient is a 33 y.o. male  who presents to the ED for concern of cough, congestion, abdominal pain, nausea, diarrhea.   Differential diagnoses prior to evaluation: The  emergent differential diagnosis includes, but is not limited to, COVID, flu, RSV, viral gastroenteritis, The causes of generalized abdominal pain include but are not limited to AAA, mesenteric ischemia, appendicitis, diverticulitis, DKA, gastritis, gastroenteritis, AMI, nephrolithiasis, pancreatitis, peritonitis, adrenal insufficiency,lead poisoning, iron toxicity, intestinal ischemia, constipation, UTI,SBO/LBO, splenic rupture, biliary disease, IBD, IBS, PUD, or hepatitis. This is not an exhaustive differential.   Past Medical History / Co-morbidities / Social History:  inflammatory bowel disease, autoimmune encephalitis, thrombophlebitis, Behcet's disease  Additional history: Chart reviewed. Pertinent results include: Reviewed significant amount of outpatient neurology visits, patient seen previously for encephalitis,  Physical Exam: Physical exam performed. The pertinent findings include:  Focal tenderness to palpation the left lower quadrant with some guarding, no rebound, rigidity.  Diffuse tenderness throughout the rest of the abdomen.  No distention.  Briefly with some tachycardia, resolved on reevaluation, no fever throughout ED evaluation.  Lab Tests/Imaging studies: I personally interpreted labs/imaging and the pertinent results include: CBC notable for significant leukocytosis, patient with a white count of 16.2.  RVP positive for COVID, negative for flu.  UA unremarkable, CMP overall unremarkable.  I agree with the radiologist interpretation.   Medications: I ordered medication including morphine, Bentyl, Dilaudid, fluid bolus, Zofran for nausea, pain, abdominal cramping.  I have reviewed the patients home medicines and have made adjustments as needed.   Disposition: After consideration of the diagnostic results and the patients response to treatment, I feel that patient with covid, acute on chronic abdominal pain, may be having pain related to complications from his behcets disease,  vs gastroenteritis, vs other.  Will discharge with nausea medication, short course of pain medication encourage close follow-up with this neurologist to discuss whether they would want him to take steroids or antibiotics in context of his Behcet's disease, I do not see any indication for antibiotics at this time.  emergency department workup does not suggest an emergent condition requiring admission or immediate intervention beyond what has been performed at this time. The plan is: as above. The patient is safe for discharge and has been instructed to return immediately for worsening symptoms, change in symptoms or any other concerns.  Final Clinical Impression(s) / ED Diagnoses Final diagnoses:  Left lower quadrant abdominal pain  COVID-19    Rx / DC Orders ED Discharge Orders          Ordered    ondansetron (ZOFRAN-ODT) 4 MG disintegrating tablet  Every  6 hours PRN,   Status:  Discontinued        12/10/23 1446    oxyCODONE-acetaminophen (PERCOCET/ROXICET) 5-325 MG tablet  Every 6 hours PRN,   Status:  Discontinued        12/10/23 1446    ondansetron (ZOFRAN-ODT) 4 MG disintegrating tablet  Every 6 hours PRN        12/10/23 1453    oxyCODONE-acetaminophen (PERCOCET/ROXICET) 5-325 MG tablet  Every 6 hours PRN        12/10/23 1453              Madison Albea, [place] H, PA-C 12/10/23 1504    Benjiman Core, MD 12/11/23 1313

## 2023-12-10 NOTE — ED Triage Notes (Signed)
 Pt reports that he has been sick since Monday. States that he has Buchets dx. Was diagnosed last year. States that he has been having abdominal pain, nausea and vomiting.

## 2024-01-31 DIAGNOSIS — Z79899 Other long term (current) drug therapy: Secondary | ICD-10-CM | POA: Diagnosis not present

## 2024-01-31 DIAGNOSIS — G0481 Other encephalitis and encephalomyelitis: Secondary | ICD-10-CM | POA: Diagnosis not present

## 2024-04-15 ENCOUNTER — Emergency Department (HOSPITAL_BASED_OUTPATIENT_CLINIC_OR_DEPARTMENT_OTHER)

## 2024-04-15 ENCOUNTER — Other Ambulatory Visit: Payer: Self-pay

## 2024-04-15 ENCOUNTER — Encounter (HOSPITAL_BASED_OUTPATIENT_CLINIC_OR_DEPARTMENT_OTHER): Payer: Self-pay

## 2024-04-15 ENCOUNTER — Emergency Department (HOSPITAL_BASED_OUTPATIENT_CLINIC_OR_DEPARTMENT_OTHER)
Admission: EM | Admit: 2024-04-15 | Discharge: 2024-04-15 | Disposition: A | Discharge status: Home / Self Care | Attending: Emergency Medicine | Admitting: Emergency Medicine

## 2024-04-15 DIAGNOSIS — R109 Unspecified abdominal pain: Secondary | ICD-10-CM | POA: Diagnosis not present

## 2024-04-15 DIAGNOSIS — M352 Behcet's disease: Secondary | ICD-10-CM | POA: Insufficient documentation

## 2024-04-15 DIAGNOSIS — R1084 Generalized abdominal pain: Secondary | ICD-10-CM | POA: Insufficient documentation

## 2024-04-15 DIAGNOSIS — R197 Diarrhea, unspecified: Secondary | ICD-10-CM | POA: Diagnosis not present

## 2024-04-15 LAB — COMPREHENSIVE METABOLIC PANEL WITH GFR
ALT: 30 U/L (ref 0–44)
AST: 24 U/L (ref 15–41)
Albumin: 4.6 g/dL (ref 3.5–5.0)
Alkaline Phosphatase: 111 U/L (ref 38–126)
Anion gap: 11 (ref 5–15)
BUN: 8 mg/dL (ref 6–20)
CO2: 25 mmol/L (ref 22–32)
Calcium: 9.6 mg/dL (ref 8.9–10.3)
Chloride: 103 mmol/L (ref 98–111)
Creatinine, Ser: 0.96 mg/dL (ref 0.61–1.24)
GFR, Estimated: >60 mL/min (ref >60)
Glucose, Bld: 77 mg/dL (ref 70–99)
Potassium: 4.2 mmol/L (ref 3.5–5.1)
Sodium: 138 mmol/L (ref 135–145)
Total Bilirubin: 0.3 mg/dL (ref 0.0–1.2)
Total Protein: 7.3 g/dL (ref 6.5–8.1)

## 2024-04-15 LAB — CBC
HCT: 43.9 % (ref 39.0–52.0)
Hemoglobin: 15.5 g/dL (ref 13.0–17.0)
MCH: 29.1 pg (ref 26.0–34.0)
MCHC: 35.3 g/dL (ref 30.0–36.0)
MCV: 82.4 fL (ref 80.0–100.0)
Platelets: 320 K/uL (ref 150–400)
RBC: 5.33 MIL/uL (ref 4.22–5.81)
RDW: 12.6 % (ref 11.5–15.5)
WBC: 10.8 K/uL — ABNORMAL HIGH (ref 4.0–10.5)
nRBC: 0 % (ref 0.0–0.2)

## 2024-04-15 LAB — URINALYSIS, W/ REFLEX TO CULTURE (INFECTION SUSPECTED)
Bacteria, UA: NONE SEEN
Bilirubin Urine: NEGATIVE
Glucose, UA: 100 mg/dL — AB
Ketones, ur: NEGATIVE mg/dL
Leukocytes,Ua: NEGATIVE
Nitrite: NEGATIVE
Protein, ur: NEGATIVE mg/dL
Specific Gravity, Urine: 1.02 (ref 1.005–1.030)
WBC, UA: NONE SEEN WBC/hpf (ref 0–5)
pH: 6 (ref 5.0–8.0)

## 2024-04-15 LAB — LIPASE, BLOOD: Lipase: 29 U/L (ref 11–51)

## 2024-04-15 MED ORDER — PREDNISONE 20 MG PO TABS: 40.0000 mg | ORAL_TABLET | Freq: Every day | ORAL | 0 refills | Status: DC

## 2024-04-15 MED ORDER — METHYLPREDNISOLONE SODIUM SUCC 125 MG IJ SOLR
125.0000 mg | Freq: Once | INTRAMUSCULAR | Status: AC
  Administered 2024-04-15: 125 mg via INTRAVENOUS
  Filled 2024-04-15: qty 2

## 2024-04-15 MED ORDER — ONDANSETRON 4 MG PO TBDP: 4.0000 mg | ORAL_TABLET | Freq: Three times a day (TID) | ORAL | 0 refills | Status: DC | PRN

## 2024-04-15 MED ORDER — LACTATED RINGERS IV BOLUS
1000.0000 mL | Freq: Once | INTRAVENOUS | Status: AC
  Administered 2024-04-15: 1000 mL via INTRAVENOUS

## 2024-04-15 MED ORDER — HYDROMORPHONE HCL 1 MG/ML IJ SOLN
1.0000 mg | Freq: Once | INTRAMUSCULAR | Status: AC
  Administered 2024-04-15: 1 mg via INTRAVENOUS
  Filled 2024-04-15: qty 1

## 2024-04-15 MED ORDER — IOHEXOL 300 MG/ML  SOLN
100.0000 mL | Freq: Once | INTRAMUSCULAR | Status: AC | PRN
  Administered 2024-04-15: 100 mL via INTRAVENOUS

## 2024-04-15 MED ORDER — OXYCODONE-ACETAMINOPHEN 5-325 MG PO TABS: 1.0000 | ORAL_TABLET | Freq: Four times a day (QID) | ORAL | 0 refills | Status: DC | PRN

## 2024-04-15 MED ORDER — METOCLOPRAMIDE HCL 5 MG/ML IJ SOLN
10.0000 mg | Freq: Once | INTRAMUSCULAR | Status: AC
  Administered 2024-04-15: 10 mg via INTRAVENOUS
  Filled 2024-04-15: qty 2

## 2024-04-15 MED ORDER — MORPHINE SULFATE (PF) 4 MG/ML IV SOLN
4.0000 mg | Freq: Once | INTRAVENOUS | Status: AC
  Administered 2024-04-15: 4 mg via INTRAVENOUS
  Filled 2024-04-15: qty 1

## 2024-04-15 MED ORDER — DICYCLOMINE HCL 10 MG PO CAPS
10.0000 mg | ORAL_CAPSULE | Freq: Once | ORAL | Status: AC
  Administered 2024-04-15: 10 mg via ORAL
  Filled 2024-04-15: qty 1

## 2024-04-15 MED ORDER — ONDANSETRON HCL 4 MG/2ML IJ SOLN
4.0000 mg | Freq: Once | INTRAMUSCULAR | Status: AC
  Administered 2024-04-15: 4 mg via INTRAVENOUS
  Filled 2024-04-15: qty 2

## 2024-04-15 NOTE — Discharge Instructions (Addendum)
 The blood work and CAT scan today were normal.  The steroids and pain and nausea medicine were sent to your pharmacy.  Make sure you are doing small frequent sips of fluid and very bland diet.  You may want to start with some broth and toast and applesauce and bananas.

## 2024-04-15 NOTE — ED Provider Notes (Addendum)
 [place] [facility] Provider Note   CSN: [id number] Arrival date & time: 04/15/24  1016     Patient presents with: Abdominal Pain   Nathan Daugherty is a 33 y.o. male.   Patient is a 33 year old male with a history of Behcet's currently on rituximab  every 6 months with prior autoimmune encephalitis and intermittent abdominal pain and cramping who is presenting today due to worsening abdominal pain and diarrhea.  He reports that started a few days ago and has been persistent.  In the last 24 hours he has had more than 15 episodes of diarrhea and reports now he is having some blood streaks in his stool.  The pain is diffuse throughout the abdomen and he has had 1 episode of vomiting today and thought there might have been a little blood in it.  He has not had fever and denies any shortness of breath.  He does have 1 oral lesion as well.  He denies any recent antibiotic use.  He has had a colonoscopy in the past and reports he had 2 polyps removed the last time he had 1 but they were both benign.  Last rituximab  infusion was in April.  He denies any cough, shortness of breath or congestion.  No urinary complaints.  The history is provided by the patient, medical records and the spouse.  Abdominal Pain      Prior to Admission medications   Medication Sig Start Date End Date Taking? Authorizing Provider  ondansetron  (ZOFRAN -ODT) 4 MG disintegrating tablet Take 1 tablet (4 mg total) by mouth every 8 (eight) hours as needed for nausea or vomiting. 04/15/24  Yes Doretha Folks, MD  oxyCODONE -acetaminophen  (PERCOCET/ROXICET) 5-325 MG tablet Take 1 tablet by mouth every 6 (six) hours as needed for severe pain (pain score 7-10). 04/15/24  Yes Errika Narvaiz, Folks, MD  predniSONE  (DELTASONE ) 20 MG tablet Take 2 tablets (40 mg total) by mouth daily. 04/15/24  Yes Lynzi Meulemans, Folks, MD  APIXABAN  (ELIQUIS ) VTE STARTER PACK (10MG  AND 5MG ) Take as directed on package: start  with two-5mg  tablets twice daily for 7 days. On day 8, switch to one-5mg  tablet twice daily. 06/13/23   Prosperi, Christian H, PA-C  dicyclomine  (BENTYL ) 20 MG tablet Take 1 tablet (20 mg total) by mouth 2 (two) times daily as needed for spasms. 05/25/23   Ghimire, Donalda HERO, MD  doxycycline  (VIBRAMYCIN ) 100 MG capsule Take 1 capsule (100 mg total) by mouth 2 (two) times daily. Patient not taking: Reported on 06/27/2023 06/04/23   Yolande Lamar BROCKS, MD  escitalopram (LEXAPRO) 10 MG tablet Take 10 mg by mouth as needed (anxiety).    [provider]  HYDROcodone -acetaminophen  (NORCO/VICODIN) 5-325 MG tablet Take 1 tablet by mouth every 4 (four) hours as needed. Patient not taking: Reported on 06/27/2023 06/13/23   Prosperi, Christian H, PA-C  Lacosamide  (VIMPAT ) 100 MG TABS Take 100 mg by mouth as needed (seizures).    [provider]  ondansetron  (ZOFRAN ) 4 MG tablet Take 1 tablet (4 mg total) by mouth every 6 (six) hours as needed for nausea or vomiting. Patient not taking: Reported on 06/27/2023 05/25/23   Raenelle Donalda HERO, MD  oxyCODONE  (ROXICODONE ) 5 MG immediate release tablet Take 1 tablet (5 mg total) by mouth every 6 (six) hours as needed for moderate pain. Patient not taking: Reported on 06/27/2023 05/25/23 05/24/24  Raenelle Donalda HERO, MD    Allergies: Patient has no known allergies.    Review of Systems  Gastrointestinal:  Positive for abdominal pain.    Updated Vital Signs BP 113/72   Pulse 70   Temp 98.3 F (36.8 C) (Oral)   Resp 18   Ht 5' 11 (1.803 m)   Wt 88.5 kg   SpO2 96%   BMI 27.20 kg/m   Physical Exam Vitals and nursing note reviewed.  Constitutional:      General: He is not in acute distress.    Appearance: He is well-developed.  HENT:     Head: Normocephalic and atraumatic.     Comments: 1 small lesion under the tongue Eyes:     Conjunctiva/sclera: Conjunctivae normal.     Pupils: Pupils are equal, round, and reactive to light.   Cardiovascular:     Rate and Rhythm: Normal rate and regular rhythm.     Heart sounds: No murmur heard. Pulmonary:     Effort: Pulmonary effort is normal. No respiratory distress.     Breath sounds: Normal breath sounds. No wheezing or rales.  Abdominal:     General: There is no distension.     Palpations: Abdomen is soft.     Tenderness: There is abdominal tenderness. There is no guarding or rebound.  Musculoskeletal:        General: No tenderness. Normal range of motion.     Cervical back: Normal range of motion and neck supple.     Right lower leg: No edema.     Left lower leg: No edema.  Skin:    General: Skin is warm and dry.     Findings: No erythema or rash.  Neurological:     Mental Status: He is alert and oriented to person, place, and time. Mental status is at baseline.  Psychiatric:        Behavior: Behavior normal.     (all labs ordered are listed, but only abnormal results are displayed) Labs Reviewed  CBC - Abnormal; Notable for the following components:      Result Value   WBC 10.8 (*)    All other components within normal limits  URINALYSIS, W/ REFLEX TO CULTURE (INFECTION SUSPECTED) - Abnormal; Notable for the following components:   Glucose, UA 100 (*)    Hgb urine dipstick MODERATE (*)    All other components within normal limits  LIPASE, BLOOD  COMPREHENSIVE METABOLIC PANEL WITH GFR    EKG: None  Radiology: CT ABDOMEN PELVIS W CONTRAST Result Date: 04/15/2024 CLINICAL DATA:  Abdominal pain. EXAM: CT ABDOMEN AND PELVIS WITH CONTRAST TECHNIQUE: Multidetector CT imaging of the abdomen and pelvis was performed using the standard protocol following bolus administration of intravenous contrast. RADIATION DOSE REDUCTION: This exam was performed according to the departmental dose-optimization program which includes automated exposure control, adjustment of the mA and/or kV according to patient size and/or use of iterative reconstruction technique. CONTRAST:   OMNIPAQUE  IOHEXOL  300 MG/ML  SOLN COMPARISON:  CT abdomen pelvis dated 12/10/2023. FINDINGS: Lower chest: The visualized lung bases are clear. No intra-abdominal free air or free fluid. Hepatobiliary: No focal liver abnormality is seen. No gallstones, gallbladder wall thickening, or biliary dilatation. Pancreas: Unremarkable. No pancreatic ductal dilatation or surrounding inflammatory changes. Spleen: Normal in size without focal abnormality. Adrenals/Urinary Tract: The adrenal glands unremarkable. The kidneys, visualized ureters, and urinary bladder appear unremarkable. Stomach/Bowel: There is no bowel obstruction or active inflammation. The appendix is normal. Vascular/Lymphatic: The abdominal aorta and IVC unremarkable. No portal venous gas. There is no adenopathy. Reproductive: The prostate and seminal vesicles are  grossly remarkable. Other: None Musculoskeletal: No acute or significant osseous findings. IMPRESSION: No acute intra-abdominal or pelvic pathology. Electronically Signed   By: Vanetta Chou M.D.   On: 04/15/2024 12:34     Procedures   Medications Ordered in the ED  HYDROmorphone  (DILAUDID ) injection 1 mg (has no administration in time range)  metoCLOPramide  (REGLAN ) injection 10 mg (has no administration in time range)  dicyclomine  (BENTYL ) capsule 10 mg (has no administration in time range)  lactated ringers  bolus 1,000 mL ( Intravenous Stopped 04/15/24 1348)  ondansetron  (ZOFRAN ) injection 4 mg (4 mg Intravenous Given 04/15/24 1201)  morphine  (PF) 4 MG/ML injection 4 mg (4 mg Intravenous Given 04/15/24 1201)  iohexol  (OMNIPAQUE ) 300 MG/ML solution 100 mL (100 mLs Intravenous Contrast Given 04/15/24 1216)  HYDROmorphone  (DILAUDID ) injection 1 mg (1 mg Intravenous Given 04/15/24 1329)  methylPREDNISolone  sodium succinate (SOLU-MEDROL ) 125 mg/2 mL injection 125 mg (125 mg Intravenous Given 04/15/24 1329)                                    Medical Decision Making Amount and/or  Complexity of Data Reviewed External Data Reviewed: notes. Labs: ordered. Decision-making details documented in ED Course. Radiology: ordered and independent interpretation performed. Decision-making details documented in ED Course.  Risk Prescription drug management.   Pt with multiple medical problems and comorbidities and presenting today with a complaint that caries a high risk for morbidity and mortality.  Here today with abdominal pain, diarrhea, infrequent vomiting and blood in the stool.  Patient does have a history of Behcet's and is currently on rituximab .  He has had episodes like this in the past with flares of his Behcet's however since he has been on the rituximab  the flares have been less frequent.  Also concern for possible kidney stone, colitis, IBD, lower suspicion for UTI or pyelonephritis.  Patient does not use alcohol or take NSAIDs regularly.  Lower suspicion for ulcer. Vital signs are stable at this time.  I independently interpreted patient's labs and UA with moderate hemoglobin but otherwise normal, CMP within normal limits, CBC with minimal leukocytosis of 10 but otherwise normal. I have independently visualized and interpreted pt's images today.  CT showed no acute pathology.  Radiology reports that CT is negative.  Patient was given IV fluids, pain and nausea medication.  3:00 PM Patient reports he is starting to feel better but still has significant pain and nausea.  Will give third dose of pain medicine and antiemetics.  Patient also given Bentyl .  He received some Solu-Medrol  due to concern that this may be a Behcet's flare.       Final diagnoses:  Generalized abdominal pain  Behcet recurrent disease [facility])    ED Discharge Orders          Ordered    oxyCODONE -acetaminophen  (PERCOCET/ROXICET) 5-325 MG tablet  Every 6 hours PRN        04/15/24 1500    ondansetron  (ZOFRAN -ODT) 4 MG disintegrating tablet  Every 8 hours PRN        04/15/24 1500    predniSONE   (DELTASONE ) 20 MG tablet  Daily        04/15/24 1500               Doretha Folks, MD 04/15/24 1500    Doretha Folks, MD 04/15/24 1500

## 2024-04-15 NOTE — ED Triage Notes (Signed)
 Pt arrives with c/o diarrhea for a couple of days with vomiting starting today. Also reports that his abdomen has been swelling. States that he has an autoimmune disease called Br[place]chettes.

## 2024-04-20 DIAGNOSIS — K120 Recurrent oral aphthae: Secondary | ICD-10-CM | POA: Diagnosis not present

## 2024-04-20 DIAGNOSIS — B078 Other viral warts: Secondary | ICD-10-CM | POA: Diagnosis not present

## 2024-04-21 ENCOUNTER — Encounter (HOSPITAL_BASED_OUTPATIENT_CLINIC_OR_DEPARTMENT_OTHER): Payer: Self-pay

## 2024-04-21 ENCOUNTER — Emergency Department (HOSPITAL_BASED_OUTPATIENT_CLINIC_OR_DEPARTMENT_OTHER): Admission: EM | Admit: 2024-04-21 | Discharge: 2024-04-21 | Disposition: A | Discharge status: Home / Self Care

## 2024-04-21 ENCOUNTER — Other Ambulatory Visit: Payer: Self-pay

## 2024-04-21 DIAGNOSIS — Z87891 Personal history of nicotine dependence: Secondary | ICD-10-CM | POA: Insufficient documentation

## 2024-04-21 DIAGNOSIS — R1032 Left lower quadrant pain: Secondary | ICD-10-CM | POA: Insufficient documentation

## 2024-04-21 DIAGNOSIS — R112 Nausea with vomiting, unspecified: Secondary | ICD-10-CM | POA: Diagnosis not present

## 2024-04-21 DIAGNOSIS — D72829 Elevated white blood cell count, unspecified: Secondary | ICD-10-CM | POA: Insufficient documentation

## 2024-04-21 DIAGNOSIS — Z7901 Long term (current) use of anticoagulants: Secondary | ICD-10-CM | POA: Insufficient documentation

## 2024-04-21 DIAGNOSIS — E876 Hypokalemia: Secondary | ICD-10-CM | POA: Insufficient documentation

## 2024-04-21 DIAGNOSIS — R109 Unspecified abdominal pain: Secondary | ICD-10-CM

## 2024-04-21 LAB — COMPREHENSIVE METABOLIC PANEL WITH GFR
ALT: 27 U/L (ref 0–44)
AST: 17 U/L (ref 15–41)
Albumin: 4.2 g/dL (ref 3.5–5.0)
Alkaline Phosphatase: 110 U/L (ref 38–126)
Anion gap: 13 (ref 5–15)
BUN: 10 mg/dL (ref 6–20)
CO2: 21 mmol/L — ABNORMAL LOW (ref 22–32)
Calcium: 9.1 mg/dL (ref 8.9–10.3)
Chloride: 102 mmol/L (ref 98–111)
Creatinine, Ser: 0.82 mg/dL (ref 0.61–1.24)
GFR, Estimated: >60 mL/min (ref >60)
Glucose, Bld: 155 mg/dL — ABNORMAL HIGH (ref 70–99)
Potassium: 3.1 mmol/L — ABNORMAL LOW (ref 3.5–5.1)
Sodium: 137 mmol/L (ref 135–145)
Total Bilirubin: <0.2 mg/dL (ref 0.0–1.2)
Total Protein: 6.8 g/dL (ref 6.5–8.1)

## 2024-04-21 LAB — DIFFERENTIAL
Abs Immature Granulocytes: 0.41 K/uL — ABNORMAL HIGH (ref 0.00–0.07)
Basophils Absolute: 0.1 K/uL (ref 0.0–0.1)
Basophils Relative: 1 %
Eosinophils Absolute: 0.3 K/uL (ref 0.0–0.5)
Eosinophils Relative: 2 %
Immature Granulocytes: 2 %
Lymphocytes Relative: 30 %
Lymphs Abs: 5.8 K/uL — ABNORMAL HIGH (ref 0.7–4.0)
Monocytes Absolute: 1.9 K/uL — ABNORMAL HIGH (ref 0.1–1.0)
Monocytes Relative: 10 %
Neutro Abs: 10.8 K/uL — ABNORMAL HIGH (ref 1.7–7.7)
Neutrophils Relative %: 55 %
Smear Review: NORMAL

## 2024-04-21 LAB — URINE DRUG SCREEN
Amphetamines: NOT DETECTED
Barbiturates: NOT DETECTED
Benzodiazepines: NOT DETECTED
Cocaine: NOT DETECTED
Fentanyl: NOT DETECTED
Methadone Scn, Ur: NOT DETECTED
Opiates: NOT DETECTED
Tetrahydrocannabinol: NOT DETECTED

## 2024-04-21 LAB — CBC
HCT: 43.6 % (ref 39.0–52.0)
Hemoglobin: 15.3 g/dL (ref 13.0–17.0)
MCH: 28.8 pg (ref 26.0–34.0)
MCHC: 35.1 g/dL (ref 30.0–36.0)
MCV: 82 fL (ref 80.0–100.0)
Platelets: 325 K/uL (ref 150–400)
RBC: 5.32 MIL/uL (ref 4.22–5.81)
RDW: 12.6 % (ref 11.5–15.5)
WBC: 19.6 K/uL — ABNORMAL HIGH (ref 4.0–10.5)
nRBC: 0 % (ref 0.0–0.2)

## 2024-04-21 LAB — URINALYSIS, MICROSCOPIC (REFLEX)

## 2024-04-21 LAB — LIPASE, BLOOD: Lipase: 25 U/L (ref 11–51)

## 2024-04-21 LAB — URINALYSIS, ROUTINE W REFLEX MICROSCOPIC
Bilirubin Urine: NEGATIVE
Glucose, UA: >=500 mg/dL — AB
Ketones, ur: NEGATIVE mg/dL
Leukocytes,Ua: NEGATIVE
Nitrite: NEGATIVE
Protein, ur: 30 mg/dL — AB
Specific Gravity, Urine: 1.025 (ref 1.005–1.030)
pH: 6 (ref 5.0–8.0)

## 2024-04-21 LAB — LACTIC ACID, PLASMA: Lactic Acid, Venous: 1.3 mmol/L (ref 0.5–1.9)

## 2024-04-21 LAB — MAGNESIUM: Magnesium: 2.1 mg/dL (ref 1.7–2.4)

## 2024-04-21 MED ORDER — PROMETHAZINE HCL 25 MG PO TABS: 25.0000 mg | ORAL_TABLET | Freq: Four times a day (QID) | ORAL | 0 refills | Status: DC | PRN

## 2024-04-21 MED ORDER — SODIUM CHLORIDE 0.9 % IV SOLN: Freq: Once | INTRAVENOUS | Status: AC

## 2024-04-21 MED ORDER — DROPERIDOL 2.5 MG/ML IJ SOLN
2.5000 mg | Freq: Once | INTRAMUSCULAR | Status: AC
  Administered 2024-04-21: 2.5 mg via INTRAVENOUS
  Filled 2024-04-21: qty 2

## 2024-04-21 MED ORDER — HYOSCYAMINE SULFATE 0.125 MG PO TBDP: 0.1250 mg | ORAL_TABLET | Freq: Four times a day (QID) | ORAL | 0 refills | Status: DC | PRN

## 2024-04-21 MED ORDER — PROMETHAZINE HCL 25 MG RE SUPP: 25.0000 mg | Freq: Four times a day (QID) | RECTAL | 0 refills | Status: DC | PRN

## 2024-04-21 MED ORDER — SODIUM CHLORIDE 0.9 % IV BOLUS
1000.0000 mL | Freq: Once | INTRAVENOUS | Status: AC
  Administered 2024-04-21: 1000 mL via INTRAVENOUS

## 2024-04-21 MED ORDER — KETOROLAC TROMETHAMINE 15 MG/ML IJ SOLN
15.0000 mg | Freq: Once | INTRAMUSCULAR | Status: AC
  Administered 2024-04-21: 15 mg via INTRAVENOUS
  Filled 2024-04-21: qty 1

## 2024-04-21 MED ORDER — POTASSIUM CHLORIDE CRYS ER 20 MEQ PO TBCR
40.0000 meq | EXTENDED_RELEASE_TABLET | Freq: Once | ORAL | Status: AC
  Administered 2024-04-21: 40 meq via ORAL
  Filled 2024-04-21: qty 2

## 2024-04-21 NOTE — ED Triage Notes (Signed)
 Pt presents with  LLQ abdominal pain. Seen here on 7/10 for same. Reports that pain has been consistent since then. Pain worsened this am.  Endorses N&V

## 2024-04-21 NOTE — ED Notes (Signed)
Report rec'd from prev RN 

## 2024-04-21 NOTE — Discharge Instructions (Addendum)
 As discussed, we will send in a referral for GI as well as primary care for your abdominal symptoms as well as primary care to help coordinate care between the specialist.  Will send you home with medications to use as needed for your symptoms.  If you do not hear from someone from the GI office by Friday afternoon, I recommend calling the number attached to your discharge papers to make sure that you are put on their schedule.

## 2024-04-21 NOTE — ED Provider Notes (Signed)
 [facility] Provider Note   CSN: [id number] Arrival date & time: 04/21/24  1111     Patient presents with: Abdominal Pain   Nathan Daugherty is a 33 y.o. male.    Abdominal Pain   34 year old male presents again to the emergency department with complaints of seen in the ED 04/15/2024 for the same complaint.  Patient reports pain in left lower abdomen and sometimes radiates across his lower abdomen to the right side as well as sometimes to his left lateral flank.  Pain is worsened with certain movements and relieved with certain positions.  States he has a history of similar symptoms occurring in the past.  When he was seen prior, was having diarrhea which she states is resolved for the past few days.  Reports 4-5 episodes of emesis over the past 6 days since he was seen nonbloody or not coffee-ground in appearance.  Denies any fevers.  States that he has had some subjective chills at home.  Denies any urinary symptoms.  Last bowel movement this morning regular per patient.  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 Per chart history, patient has had 8 CT scans in less than 2 years for assessment of intra-abdominal pain all of which have been negative for any acute process for similar presentations in the past.  Has not seen GI since 2019 besides when hospitalized in the [facility] system once.  Denies any alcohol use, marijuana use.  States he has been trying to restrict his diet to help with his symptoms..  Past medical history significant for C. difficile colitis, IBD, autoimmune encephalitis, seizure-like activity, chronic abdominal pain  Prior to Admission medications   Medication Sig Start Date End Date Taking? Authorizing Provider  APIXABAN  (ELIQUIS ) VTE STARTER PACK  (10MG  AND 5MG ) Take as directed on package: start with two-5mg  tablets twice daily for 7 days. On day 8, switch to one-5mg  tablet twice daily. 06/13/23   Prosperi, Christian H, PA-C  dicyclomine  (BENTYL ) 20 MG tablet Take 1 tablet (20 mg total) by mouth 2 (two) times daily as needed for spasms. 05/25/23   Ghimire, Donalda HERO, MD  doxycycline  (VIBRAMYCIN ) 100 MG capsule Take 1 capsule (100 mg total) by mouth 2 (two) times daily. Patient not taking: Reported on 06/27/2023 06/04/23   Yolande Lamar BROCKS, MD  escitalopram (LEXAPRO) 10 MG tablet Take 10 mg by mouth as needed (anxiety).    [provider]  HYDROcodone -acetaminophen  (NORCO/VICODIN) 5-325 MG tablet Take 1 tablet by mouth every 4 (four) hours as needed. Patient not taking: Reported on 06/27/2023 06/13/23   Prosperi, Christian H, PA-C  Lacosamide  (VIMPAT ) 100 MG TABS Take 100 mg by mouth as needed (seizures).    [provider]  ondansetron  (ZOFRAN ) 4 MG tablet Take 1 tablet (4 mg total) by mouth every 6 (six) hours as needed for nausea or vomiting. Patient not taking: Reported on 06/27/2023 05/25/23   Raenelle Donalda HERO, MD  ondansetron  (ZOFRAN -ODT) 4 MG disintegrating tablet Take 1 tablet (4 mg total) by mouth every 8 (eight) hours as needed for nausea or vomiting. 04/15/24   Doretha Folks, MD  oxyCODONE  (ROXICODONE ) 5 MG immediate release tablet Take 1 tablet (5 mg total) by mouth every 6 (six) hours as needed for moderate pain. Patient not taking: Reported on 06/27/2023 05/25/23 05/24/24  Ghimire, Donalda HERO, MD  oxyCODONE -acetaminophen  (PERCOCET/ROXICET) 5-325 MG tablet Take 1 tablet by mouth every 6 (six) hours as needed for severe pain (pain score 7-10). 04/15/24   Doretha Folks, MD  predniSONE  (DELTASONE ) 20 MG tablet Take 2 tablets (40 mg total) by mouth daily. 04/15/24   Doretha Folks, MD    Allergies: Patient has no known allergies.    Review of Systems  Gastrointestinal:  Positive for abdominal pain.  All other  systems reviewed and are negative.   Updated Vital Signs There were no vitals taken for this visit.  Physical Exam Vitals and nursing note reviewed.  Constitutional:      General: He is not in acute distress.    Appearance: He is well-developed.  HENT:     Head: Normocephalic and atraumatic.  Eyes:     Conjunctiva/sclera: Conjunctivae normal.  Cardiovascular:     Rate and Rhythm: Normal rate and regular rhythm.     Heart sounds: No murmur heard. Pulmonary:     Effort: Pulmonary effort is normal. No respiratory distress.     Breath sounds: Normal breath sounds.  Abdominal:     Palpations: Abdomen is soft.     Tenderness: There is abdominal tenderness in the left lower quadrant. There is no right CVA tenderness, left CVA tenderness, guarding or rebound.  Musculoskeletal:        General: No swelling.     Cervical back: Neck supple.  Skin:    General: Skin is warm and dry.     Capillary Refill: Capillary refill takes less than 2 seconds.  Neurological:     Mental Status: He is alert.  Psychiatric:        Mood and Affect: Mood normal.     (all labs ordered are listed, but only abnormal results are displayed) Labs Reviewed - No data to display  EKG: None  Radiology: No results found.   Procedures   Medications Ordered in the ED - No data to display                                  Medical Decision Making Amount and/or Complexity of Data Reviewed Labs: ordered.  Risk Prescription drug management.   This patient presents to the ED for concern of abdominal pain, this involves an extensive number of treatment options, and is a complaint that carries with it a high risk of complications and morbidity.  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     His autoimmune encephalitis panel has been negative. ANA is negative. ANCA profile is negative. His extractable nuclear antigen panel including SSA, SSB, Smith, RNP, SCL-70, Jo-1 are all negative.   He also had peripheral flow cytometry performed at [facility] which was also negative.   He is followed up at [facility] and had another repeat LP performed along with a brain MRI.  His brain MRI showed no further enhancement of his leptomeninges.  Lab Tests:  I Ordered, and personally interpreted  labs.  The pertinent results include: Leukocytosis of 19.6.  No evidence of anemia.  Platelets within range.  Mild hypokalemia 3.1 supplemented orally.  Decreased bicarb 21.  No transaminitis.  No renal dysfunction.  UA 500 glucose, moderate hemoglobin and 30 protein, rare bacteria, 11-20 BCs; patient seems to have rare bacteria and most of his urines performed from prior visits over the past couple of years.  UDS negative.  Lipase within normal limits.  Lactic acid within normal limits   Imaging Studies ordered:  N/a   Cardiac Monitoring: / EKG:  The patient was maintained on a cardiac monitor.  I personally viewed and interpreted the cardiac monitored which showed an underlying rhythm of: sinus rhythm   Consultations Obtained:  N/a   Problem List / ED Course / Critical interventions / Medication management  Left-sided abdominal pain, nausea, vomiting I ordered medication including normal saline, Toradol , droperidol , potassium chloride   Reevaluation of the patient after these medicines showed that the patient improved I have reviewed the  patients home medicines and have made adjustments as needed   Social Determinants of Health:  Former cigarette use.  Denies illicit drug use.   Test / Admission - Considered:  Left-sided abdominal pain, nausea, vomiting Vitals signs significant for initial tachycardia which resolved with normal labs in the ED.SABRA Otherwise within normal range and stable throughout visit. Laboratory studies significant for: See above 33 year old male presents again to the emergency department with complaints of seen in the ED 04/15/2024 for the same complaint.  Patient reports pain in left lower abdomen and sometimes radiates across his lower abdomen to the right side as well as sometimes to his left lateral flank.  Pain is worsened with certain movements and relieved with certain positions.  States he has a history of similar symptoms occurring in the past.  When he was seen prior, was having diarrhea which she states is resolved for the past few days.  Reports 4-5 episodes of emesis over the past 6 days since he was seen nonbloody or not coffee-ground in appearance.  Denies any fevers.  States that he has had some subjective chills at home.  Denies any urinary symptoms.  Last bowel movement this morning regular per patient.  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 Per chart history, patient has had 8 CT scans in less than 2 years for assessment of intra-abdominal pain all of which have been negative for any acute process for similar presentations in the past.  Has not seen GI since 2019 besides when hospitalized in the [facility] system once.  Denies any alcohol use, marijuana use.  States he has been trying to restrict his diet to help with his symptoms. On exam, patient did have left-sided abdominal tenderness as well as tenderness to  left upper quadrant of abdomen.  Labs concerning for leukocytosis as above but with normal lactic acid.  Patient just recently completed course of 5 to 6 days of corticosteroids which I think is most likely attributing to leukocytosis.  Patient's lactic acid was normal and otherwise did not meet SIRS criteria.  Patient has had 8 CT scans in the past 2 years that have been reassuring with most recent 6 days ago for similar presentation.  Labs otherwise concerning for slight hypokalemia supplemented orally.  Given 1 L of fluids, antiemetic, Toradol  with improvement of  symptoms.  Serial abdominal exams show improvement.  Shared decision-making conversation was held with the amount of radiation the patient has had on his abdomen not including other CT scans that he had in the same time period that have not show anything acutely abnormal and patient subsequently deferred CT scan.  Has not followed up with GI in the outpatient setting regarding his intermittent chronic abdominal pain for the past 5 to 6 years.  Will send referral for GI.  Patient also has not been able to establish care with a primary care provider and is requesting referral for PCP in the outpatient setting which will also be performed to help with evaluation/treatment and coordination of care.  Will recommend symptomatic therapy of abdominal pain as described in AVS.  Treatment plan discussed with patient and he acknowledged understanding was agreeable to said plan.  Patient well-appearing, afebrile, tolerating p.o. and in no acute distress upon discharge. Worrisome signs and symptoms were discussed with the patient, and the patient acknowledged understanding to return to the ED if noticed. Patient was stable upon discharge.       Final diagnoses:  None    ED Discharge Orders     None          Silver Wonda LABOR, [place] 04/21/24 1527    Gennaro Bouchard L, DO 04/23/24 1336

## 2024-04-27 DIAGNOSIS — K1370 Unspecified lesions of oral mucosa: Secondary | ICD-10-CM | POA: Diagnosis not present

## 2024-04-30 DIAGNOSIS — G96198 Other disorders of meninges, not elsewhere classified: Secondary | ICD-10-CM | POA: Diagnosis not present

## 2024-04-30 DIAGNOSIS — G379 Demyelinating disease of central nervous system, unspecified: Secondary | ICD-10-CM | POA: Diagnosis not present

## 2024-04-30 DIAGNOSIS — M352 Behcet's disease: Secondary | ICD-10-CM | POA: Diagnosis not present

## 2024-04-30 DIAGNOSIS — R569 Unspecified convulsions: Secondary | ICD-10-CM | POA: Diagnosis not present

## 2024-04-30 DIAGNOSIS — R109 Unspecified abdominal pain: Secondary | ICD-10-CM | POA: Diagnosis not present

## 2024-04-30 DIAGNOSIS — G0481 Other encephalitis and encephalomyelitis: Secondary | ICD-10-CM | POA: Diagnosis not present

## 2024-05-03 DIAGNOSIS — K625 Hemorrhage of anus and rectum: Secondary | ICD-10-CM | POA: Diagnosis not present

## 2024-05-03 DIAGNOSIS — R1031 Right lower quadrant pain: Secondary | ICD-10-CM | POA: Diagnosis not present

## 2024-05-03 DIAGNOSIS — M352 Behcet's disease: Secondary | ICD-10-CM | POA: Diagnosis not present

## 2024-05-03 DIAGNOSIS — R112 Nausea with vomiting, unspecified: Secondary | ICD-10-CM | POA: Diagnosis not present

## 2024-05-06 DIAGNOSIS — M352 Behcet's disease: Secondary | ICD-10-CM | POA: Diagnosis not present

## 2024-05-07 ENCOUNTER — Encounter: Payer: Self-pay | Admitting: Emergency Medicine

## 2024-05-12 ENCOUNTER — Other Ambulatory Visit: Payer: Self-pay

## 2024-05-12 ENCOUNTER — Inpatient Hospital Stay (HOSPITAL_BASED_OUTPATIENT_CLINIC_OR_DEPARTMENT_OTHER)
Admission: EM | Admit: 2024-05-12 | Discharge: 2024-05-16 | DRG: 392 | Disposition: A | Discharge status: Home / Self Care | Attending: Internal Medicine | Admitting: Internal Medicine

## 2024-05-12 ENCOUNTER — Emergency Department (HOSPITAL_BASED_OUTPATIENT_CLINIC_OR_DEPARTMENT_OTHER)

## 2024-05-12 ENCOUNTER — Encounter (HOSPITAL_BASED_OUTPATIENT_CLINIC_OR_DEPARTMENT_OTHER): Payer: Self-pay

## 2024-05-12 DIAGNOSIS — Z885 Allergy status to narcotic agent status: Secondary | ICD-10-CM

## 2024-05-12 DIAGNOSIS — R1032 Left lower quadrant pain: Secondary | ICD-10-CM | POA: Diagnosis not present

## 2024-05-12 DIAGNOSIS — K5732 Diverticulitis of large intestine without perforation or abscess without bleeding: Secondary | ICD-10-CM | POA: Diagnosis not present

## 2024-05-12 DIAGNOSIS — Z833 Family history of diabetes mellitus: Secondary | ICD-10-CM

## 2024-05-12 DIAGNOSIS — Z803 Family history of malignant neoplasm of breast: Secondary | ICD-10-CM

## 2024-05-12 DIAGNOSIS — M352 Behcet's disease: Secondary | ICD-10-CM | POA: Diagnosis present

## 2024-05-12 DIAGNOSIS — Z801 Family history of malignant neoplasm of trachea, bronchus and lung: Secondary | ICD-10-CM | POA: Diagnosis not present

## 2024-05-12 DIAGNOSIS — G43909 Migraine, unspecified, not intractable, without status migrainosus: Secondary | ICD-10-CM | POA: Diagnosis not present

## 2024-05-12 DIAGNOSIS — K5792 Diverticulitis of intestine, part unspecified, without perforation or abscess without bleeding: Principal | ICD-10-CM | POA: Diagnosis present

## 2024-05-12 DIAGNOSIS — Z8661 Personal history of infections of the central nervous system: Secondary | ICD-10-CM

## 2024-05-12 DIAGNOSIS — Z79899 Other long term (current) drug therapy: Secondary | ICD-10-CM | POA: Diagnosis not present

## 2024-05-12 DIAGNOSIS — Z87891 Personal history of nicotine dependence: Secondary | ICD-10-CM

## 2024-05-12 DIAGNOSIS — R109 Unspecified abdominal pain: Secondary | ICD-10-CM | POA: Diagnosis not present

## 2024-05-12 LAB — LIPASE, BLOOD: Lipase: 30 U/L (ref 11–51)

## 2024-05-12 LAB — URINALYSIS, ROUTINE W REFLEX MICROSCOPIC
Bilirubin Urine: NEGATIVE
Glucose, UA: NEGATIVE mg/dL
Ketones, ur: NEGATIVE mg/dL
Leukocytes,Ua: NEGATIVE
Nitrite: NEGATIVE
Protein, ur: NEGATIVE mg/dL
Specific Gravity, Urine: <=1.005 (ref 1.005–1.030)
pH: 6 (ref 5.0–8.0)

## 2024-05-12 LAB — COMPREHENSIVE METABOLIC PANEL WITH GFR
ALT: 27 U/L (ref 0–44)
AST: 21 U/L (ref 15–41)
Albumin: 4.8 g/dL (ref 3.5–5.0)
Alkaline Phosphatase: 115 U/L (ref 38–126)
Anion gap: 12 (ref 5–15)
BUN: 7 mg/dL (ref 6–20)
CO2: 23 mmol/L (ref 22–32)
Calcium: 9.9 mg/dL (ref 8.9–10.3)
Chloride: 102 mmol/L (ref 98–111)
Creatinine, Ser: 0.96 mg/dL (ref 0.61–1.24)
GFR, Estimated: >60 mL/min (ref >60)
Glucose, Bld: 73 mg/dL (ref 70–99)
Potassium: 3.9 mmol/L (ref 3.5–5.1)
Sodium: 137 mmol/L (ref 135–145)
Total Bilirubin: 0.4 mg/dL (ref 0.0–1.2)
Total Protein: 7.9 g/dL (ref 6.5–8.1)

## 2024-05-12 LAB — URINALYSIS, MICROSCOPIC (REFLEX): Squamous Epithelial / HPF: NONE SEEN /HPF (ref 0–5)

## 2024-05-12 LAB — CBC WITH DIFFERENTIAL/PLATELET
Abs Immature Granulocytes: 0.14 K/uL — ABNORMAL HIGH (ref 0.00–0.07)
Basophils Absolute: 0.1 K/uL (ref 0.0–0.1)
Basophils Relative: 0 %
Eosinophils Absolute: 0.4 K/uL (ref 0.0–0.5)
Eosinophils Relative: 2 %
HCT: 44.6 % (ref 39.0–52.0)
Hemoglobin: 15.6 g/dL (ref 13.0–17.0)
Immature Granulocytes: 1 %
Lymphocytes Relative: 21 %
Lymphs Abs: 3.7 K/uL (ref 0.7–4.0)
MCH: 29 pg (ref 26.0–34.0)
MCHC: 35 g/dL (ref 30.0–36.0)
MCV: 82.9 fL (ref 80.0–100.0)
Monocytes Absolute: 2.1 K/uL — ABNORMAL HIGH (ref 0.1–1.0)
Monocytes Relative: 11 %
Neutro Abs: 11.8 K/uL — ABNORMAL HIGH (ref 1.7–7.7)
Neutrophils Relative %: 65 %
Platelets: 388 K/uL (ref 150–400)
RBC: 5.38 MIL/uL (ref 4.22–5.81)
RDW: 12.8 % (ref 11.5–15.5)
WBC: 18.2 K/uL — ABNORMAL HIGH (ref 4.0–10.5)
nRBC: 0 % (ref 0.0–0.2)

## 2024-05-12 LAB — LACTIC ACID, PLASMA: Lactic Acid, Venous: 1.4 mmol/L (ref 0.5–1.9)

## 2024-05-12 MED ORDER — SENNOSIDES-DOCUSATE SODIUM 8.6-50 MG PO TABS: 1.0000 | ORAL_TABLET | Freq: Every evening | ORAL | Status: DC | PRN

## 2024-05-12 MED ORDER — HYDROMORPHONE HCL 1 MG/ML IJ SOLN
1.0000 mg | Freq: Once | INTRAMUSCULAR | Status: AC
  Administered 2024-05-12: 1 mg via INTRAVENOUS
  Filled 2024-05-12: qty 1

## 2024-05-12 MED ORDER — ACETAMINOPHEN 325 MG PO TABS
650.0000 mg | ORAL_TABLET | Freq: Four times a day (QID) | ORAL | Status: DC | PRN
  Administered 2024-05-15: 650 mg via ORAL
  Filled 2024-05-12 (×2): qty 2

## 2024-05-12 MED ORDER — SODIUM CHLORIDE 0.9 % IV SOLN: INTRAVENOUS | Status: AC

## 2024-05-12 MED ORDER — GABAPENTIN 300 MG PO CAPS
300.0000 mg | ORAL_CAPSULE | Freq: Every day | ORAL | Status: DC
  Administered 2024-05-12 – 2024-05-15 (×4): 300 mg via ORAL
  Filled 2024-05-12 (×4): qty 1

## 2024-05-12 MED ORDER — ONDANSETRON HCL 4 MG/2ML IJ SOLN
4.0000 mg | Freq: Four times a day (QID) | INTRAMUSCULAR | Status: DC | PRN
  Administered 2024-05-12 – 2024-05-13 (×3): 4 mg via INTRAVENOUS
  Filled 2024-05-12 (×3): qty 2

## 2024-05-12 MED ORDER — SODIUM CHLORIDE 0.9 % IV BOLUS
1000.0000 mL | Freq: Once | INTRAVENOUS | Status: AC
  Administered 2024-05-12: 1000 mL via INTRAVENOUS

## 2024-05-12 MED ORDER — HYDROMORPHONE HCL 1 MG/ML IJ SOLN
0.5000 mg | Freq: Four times a day (QID) | INTRAMUSCULAR | Status: DC | PRN
  Administered 2024-05-12 – 2024-05-13 (×3): 0.5 mg via INTRAVENOUS
  Filled 2024-05-12 (×3): qty 0.5

## 2024-05-12 MED ORDER — BISACODYL 5 MG PO TBEC: 5.0000 mg | DELAYED_RELEASE_TABLET | Freq: Every day | ORAL | Status: DC | PRN

## 2024-05-12 MED ORDER — ACETAMINOPHEN 650 MG RE SUPP: 650.0000 mg | Freq: Four times a day (QID) | RECTAL | Status: DC | PRN

## 2024-05-12 MED ORDER — SODIUM CHLORIDE 0.9 % IV SOLN
2.0000 g | INTRAVENOUS | Status: DC
  Administered 2024-05-13 – 2024-05-15 (×3): 2 g via INTRAVENOUS
  Filled 2024-05-12 (×3): qty 20

## 2024-05-12 MED ORDER — ONDANSETRON HCL 4 MG PO TABS: 4.0000 mg | ORAL_TABLET | Freq: Four times a day (QID) | ORAL | Status: DC | PRN

## 2024-05-12 MED ORDER — METRONIDAZOLE 500 MG/100ML IV SOLN
500.0000 mg | Freq: Two times a day (BID) | INTRAVENOUS | Status: DC
  Administered 2024-05-13 – 2024-05-16 (×7): 500 mg via INTRAVENOUS
  Filled 2024-05-12 (×7): qty 100

## 2024-05-12 MED ORDER — ENOXAPARIN SODIUM 40 MG/0.4ML IJ SOSY
40.0000 mg | PREFILLED_SYRINGE | INTRAMUSCULAR | Status: DC
  Filled 2024-05-12: qty 0.4

## 2024-05-12 MED ORDER — IOHEXOL 300 MG/ML  SOLN
80.0000 mL | Freq: Once | INTRAMUSCULAR | Status: AC | PRN
  Administered 2024-05-12: 80 mL via INTRAVENOUS

## 2024-05-12 MED ORDER — SODIUM CHLORIDE 0.9 % IV SOLN
2.0000 g | Freq: Once | INTRAVENOUS | Status: AC
  Administered 2024-05-12: 2 g via INTRAVENOUS
  Filled 2024-05-12: qty 20

## 2024-05-12 MED ORDER — LACTATED RINGERS IV BOLUS
1000.0000 mL | Freq: Once | INTRAVENOUS | Status: AC
  Administered 2024-05-12: 1000 mL via INTRAVENOUS

## 2024-05-12 MED ORDER — METRONIDAZOLE 500 MG/100ML IV SOLN
500.0000 mg | Freq: Once | INTRAVENOUS | Status: AC
  Administered 2024-05-12: 500 mg via INTRAVENOUS
  Filled 2024-05-12: qty 100

## 2024-05-12 MED ORDER — ONDANSETRON HCL 4 MG/2ML IJ SOLN
4.0000 mg | Freq: Once | INTRAMUSCULAR | Status: AC
  Administered 2024-05-12: 4 mg via INTRAVENOUS
  Filled 2024-05-12: qty 2

## 2024-05-12 MED ORDER — MORPHINE SULFATE (PF) 4 MG/ML IV SOLN
4.0000 mg | Freq: Once | INTRAVENOUS | Status: AC
  Administered 2024-05-12: 4 mg via INTRAVENOUS
  Filled 2024-05-12: qty 1

## 2024-05-12 NOTE — ED Provider Notes (Signed)
 [place] [facility] Provider Note   CSN: [id number] Arrival date & time: 05/12/24  1142     Patient presents with: Abdominal Pain   Nathan Daugherty is a 33 y.o. 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  Patient notes that this feels different than his normal abdominal pain.  He describes his pain as a constricting pain worse with urination and bowel movements.  Denies any hematuria.  No fever or chills.  Patient admits to ongoing diarrhea that has been going on for numerous months.  No recent bloody diarrhea.  He currently follows [facility].  Has a scheduled endoscopy and colonoscopy in the next few weeks.  Admits to some nausea when his pain becomes severe however, no vomiting.  No concern for STIs.  No penile discharge.  History of kidney stones.  No previous abdominal operations.  History obtained from patient and past medical records. No interpreter used during encounter.       Prior to Admission medications   Medication Sig Start Date End Date Taking? Authorizing Provider  hyoscyamine  (ANASPAZ ) 0.125 MG TBDP disintergrating tablet Place 1 tablet (0.125 mg total) under the tongue every 6 (six) hours as needed (abdominal discomfort). 04/21/24   Silver Wonda LABOR, PA  Lacosamide  (VIMPAT ) 100 MG TABS Take 100 mg by mouth as needed (seizures).    [provider]  ondansetron  (ZOFRAN -ODT) 4 MG disintegrating tablet Take 1 tablet (4 mg total) by mouth every 8 (eight) hours as needed for nausea or vomiting. 04/15/24   Doretha Folks, MD  promethazine  (PHENERGAN ) 25 MG suppository Place 1 suppository (25 mg total) rectally every 6 (six) hours as needed for nausea or vomiting. 04/21/24   Silver Wonda LABOR, PA  promethazine  (PHENERGAN ) 25 MG tablet  Take 1 tablet (25 mg total) by mouth every 6 (six) hours as needed for nausea or vomiting. 04/21/24   Silver Wonda LABOR, PA    Allergies: Patient has no known allergies.    Review of Systems  Constitutional:  Negative for fever.  Respiratory:  Negative for shortness of breath.   Cardiovascular:  Negative for chest pain.  Gastrointestinal:  Positive for abdominal pain and diarrhea. Negative for nausea and vomiting.  Genitourinary:  Positive for dysuria.    Updated Vital Signs BP 137/86 (BP Location: Right Arm)   Pulse (!) 108   Temp 98.2 F (36.8 C) (Oral)   Resp 18   Wt 88.5 kg   SpO2 98%   BMI 27.20 kg/m   Physical Exam Vitals and nursing note reviewed.  Constitutional:      General: He is not in acute distress.    Appearance: He is not ill-appearing.  HENT:     Head: Normocephalic.  Eyes:     Pupils: Pupils are equal, round, and reactive to light.  Cardiovascular:     Rate and Rhythm: Normal rate and regular rhythm.     Pulses: Normal pulses.     Heart sounds: Normal heart sounds. No murmur heard.    No friction rub. No gallop.  Pulmonary:     Effort: Pulmonary effort is normal.     Breath sounds: Normal breath sounds.  Abdominal:     General: Abdomen is flat. There is no distension.     Palpations: Abdomen is soft.  Tenderness: There is abdominal tenderness. There is no guarding or rebound.     Comments: Diffuse tenderness, most significant in lower quadrants.   Musculoskeletal:        General: Normal range of motion.     Cervical back: Neck supple.  Skin:    General: Skin is warm and dry.  Neurological:     General: No focal deficit present.     Mental Status: He is alert.  Psychiatric:        Mood and Affect: Mood normal.        Behavior: Behavior normal.     (all labs ordered are listed, but only abnormal results are displayed) Labs Reviewed  URINALYSIS, ROUTINE W REFLEX MICROSCOPIC - Abnormal; Notable for the following components:      Result  Value   Color, Urine STRAW (*)    Hgb urine dipstick TRACE (*)    All other components within normal limits  CBC WITH DIFFERENTIAL/PLATELET - Abnormal; Notable for the following components:   WBC 18.2 (*)    Neutro Abs 11.8 (*)    Monocytes Absolute 2.1 (*)    Abs Immature Granulocytes 0.14 (*)    All other components within normal limits  URINALYSIS, MICROSCOPIC (REFLEX) - Abnormal; Notable for the following components:   Bacteria, UA RARE (*)    All other components within normal limits  CULTURE, BLOOD (ROUTINE X 2)  CULTURE, BLOOD (ROUTINE X 2)  LIPASE, BLOOD  COMPREHENSIVE METABOLIC PANEL WITH GFR  LACTIC ACID, PLASMA    EKG: None  Radiology: CT ABDOMEN PELVIS W CONTRAST Result Date: 05/12/2024 CLINICAL DATA:  Left lower quadrant abdominal pain EXAM: CT ABDOMEN AND PELVIS WITH CONTRAST TECHNIQUE: Multidetector CT imaging of the abdomen and pelvis was performed using the standard protocol following bolus administration of intravenous contrast. RADIATION DOSE REDUCTION: This exam was performed according to the departmental dose-optimization program which includes automated exposure control, adjustment of the mA and/or kV according to patient size and/or use of iterative reconstruction technique. CONTRAST:  80mL OMNIPAQUE  IOHEXOL  300 MG/ML  SOLN COMPARISON:  04/15/2024 FINDINGS: Lower chest: No acute pleural or parenchymal lung disease. Hepatobiliary: No focal liver abnormality is seen. No gallstones, gallbladder wall thickening, or biliary dilatation. Pancreas: Unremarkable. No pancreatic ductal dilatation or surrounding inflammatory changes. Spleen: Normal in size without focal abnormality. Adrenals/Urinary Tract: Adrenal glands are unremarkable. Kidneys are normal, without renal calculi, focal lesion, or hydronephrosis. Bladder is unremarkable. Stomach/Bowel: No bowel obstruction or ileus. Diverticulosis of the sigmoid colon, with short segment wall thickening and pericolonic fat  stranding consistent with acute uncomplicated diverticulitis. No perforation, fluid collection, or abscess. Normal appendix right lower quadrant. Vascular/Lymphatic: No significant vascular findings are present. No enlarged abdominal or pelvic lymph nodes. Reproductive: Prostate is unremarkable. Other: No free fluid or free intraperitoneal gas. No abdominal wall hernia. Musculoskeletal: No acute or destructive bony abnormalities. Reconstructed images demonstrate no additional findings. IMPRESSION: 1. Acute uncomplicated sigmoid diverticulitis, with no evidence of perforation, fluid collection, or abscess. Electronically Signed   By: Ozell Daring M.D.   On: 05/12/2024 14:27     Procedures   Medications Ordered in the ED  HYDROmorphone  (DILAUDID ) injection 1 mg (has no administration in time range)  cefTRIAXone  (ROCEPHIN ) 2 g in sodium chloride  0.9 % 100 mL IVPB (has no administration in time range)    And  metroNIDAZOLE  (FLAGYL ) IVPB 500 mg (has no administration in time range)  lactated ringers  bolus 1,000 mL (has no administration in time range)  sodium  chloride 0.9 % bolus 1,000 mL (1,000 mLs Intravenous New Bag/Given 05/12/24 1242)  ondansetron  (ZOFRAN ) injection 4 mg (4 mg Intravenous Given 05/12/24 1242)  morphine  (PF) 4 MG/ML injection 4 mg (4 mg Intravenous Given 05/12/24 1243)  iohexol  (OMNIPAQUE ) 300 MG/ML solution 80 mL (80 mLs Intravenous Contrast Given 05/12/24 1310)  HYDROmorphone  (DILAUDID ) injection 1 mg (1 mg Intravenous Given 05/12/24 1334)    Clinical Course as of 05/12/24 1503  Wed May 12, 2024  1316 WBC(!): 18.2 [CA]  1444 Reassessed patient at bedside.  Patient still admits to significant pain after morphine  and Dilaudid .  CT personally reviewed and interpreted which demonstrates diverticulitis.  Had a long discussion with patient.  Shared decision making in regards to discharge with p.o. antibiotics versus admission.  Patient feels he needs to be admitted to hospital due to  significant pain.  IV antibiotics and fluids started.  Blood cultures ordered. [CA]    Clinical Course User Index [CA] Lorelle Aleck BROCKS, PA-C                                 Medical Decision Making Amount and/or Complexity of Data Reviewed Independent Historian: spouse    Details: Wife at bedside provided some history External Data Reviewed: notes. Labs: ordered. Decision-making details documented in ED Course. Radiology: ordered and independent interpretation performed. Decision-making details documented in ED Course.  Risk Prescription drug management. Decision regarding hospitalization.   This patient presents to the ED for concern of abdominal pain, this involves an extensive number of treatment options, and is a complaint that carries with it a high risk of complications and morbidity.  The differential diagnosis includes diverticulitis, appendicitis, kidney stone, bowel obstruction, gastroenteritis, etc  33 year old male presents to the ED due to worsening lower abdominal pain that started yesterday.  Complicated history of autoimmune encephalitis and Behcet's disease. Followed by [facility].  Also admits to ongoing diarrhea for numerous months.  Upon arrival patient mildly tachycardic at 108.  Patient afebrile.  On exam abdomen soft, nondistended with diffuse tenderness with significant lower quadrants.  Patient well-appearing on exam.  Routine labs ordered.  Will obtain CT abdomen given patient notes this feels different than his typical abdominal pain and more severe.  IV morphine , IV fluids, and Zofran  given.  CBC significant for leukocytosis at 18.2.  Patient appears to have a chronic leukocytosis.  Last finished prednisone  1 week ago.  Normal hemoglobin.  CMP unremarkable.  Normal renal function.  No major electrolyte derangements.  Lipase normal.  Low suspicion for pancreatitis.  Lactic acid normal.  UA with trace hematuria.  No signs of infection.  CT abdomen personally  reviewed and interpreted which demonstrates diverticulitis.  Upon reassessment patient still in severe pain after IV morphine  and Dilaudid .  Patient will require admission for diverticulitis and pain management.  IV fluids and antibiotics started.  Blood cultures added given significant leukocytosis which could be related to his recent steroids.  Will consult hospitalist for admission.  3:27 PM Discussed with Dr. Lue with [facility] who agrees to admit patient.    Co morbidities that complicate the patient evaluation  IBD, autoimmune encephalitis  Social Determinants of Health:  No PCP      Final diagnoses:  Diverticulitis    ED Discharge Orders          Ordered    Recheck vitals        05/12/24 1444  Lorelle Aleck BROCKS, PA-C 05/12/24 1528    Jerrol Agent, MD 05/15/24 [phone]

## 2024-05-12 NOTE — ED Triage Notes (Addendum)
 Pt reports abdominal pain is extremely lower than usual abdominal pain since last night .Pain with urination. Described as constricted. Diarrhea for weeks

## 2024-05-12 NOTE — H&P (Signed)
 History and Physical  Nathan Daugherty FMW:[id number] DOB: [birth year] DOA: 05/12/2024  PCP: Patient, No Pcp Per   Chief Complaint: Abdominal pain  HPI: Nathan Daugherty is a 33 y.o. 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 Patient reports he was dealing with stomach bug few weeks ago with multiple watery stools per day. He has been feeling well over the last week however last night he started having extreme abdominal cramping. He woke up this morning with worsening pain described as constricting pain and intermittently stabbing that is worse with movement, bowel movement or with urination. He endorsed mild nausea this morning but denies any vomiting, fevers, chills, constipation, back pain, chest pain, shortness of breath or headache.  ED Course: Initial vitals show patient afebrile and normotensive. Initial labs significant for leukocytosis of 18,000 otherwise normal CMP, lactic acid and urinalysis.  CT A/P shows acute uncomplicated sigmoid diverticulitis with no evidence of perforation or fluid collection or abscess. Pt received IV NS 1 L bolus x 2, IV morphine , IV Dilaudid , IV Zofran , IV Rocephin  and Flagyl .  Patient was admitted to [facility] service and transferred to [facility].  Review of Systems: Please see HPI for pertinent positives and negatives. A complete 10 system review of systems are otherwise negative.  Past Medical History:  Diagnosis Date   Colitis, Clostridium difficile 10/25/2016   Disease of brain    inflammatory brain disease autoimmune related   IBD (inflammatory bowel disease)    presumed Autoimmune encephalitis    Seizure-like activity (HCC)    Past Surgical History:  Procedure Laterality Date   HYPOSPADIAS CORRECTION     Social History:  reports that he has quit smoking. His smoking use included cigarettes.  He has never used smokeless tobacco. He reports that he does not currently use alcohol. He reports that he does not use drugs.  Allergies  Allergen Reactions   Toradol  [Ketorolac  Tromethamine ] Photosensitivity    Migraines   Tramadol Photosensitivity and Other (See Comments)    AMS, Migraines    Family History  Problem Relation Age of Onset   Breast cancer Mother    Diabetes Mellitus II Maternal Grandmother    Breast cancer Maternal Grandmother    Lung cancer Maternal Grandfather      Prior to Admission medications   Medication Sig Start Date End Date Taking? Authorizing Provider  hyoscyamine  (ANASPAZ ) 0.125 MG TBDP disintergrating tablet Place 1 tablet (0.125 mg total) under the tongue every 6 (six) hours as needed (abdominal discomfort). 04/21/24   Silver Wonda LABOR, PA  Lacosamide  (VIMPAT ) 100 MG TABS Take 100 mg by mouth as needed (seizures).    [provider]  ondansetron  (ZOFRAN -ODT) 4 MG disintegrating tablet Take 1 tablet (4 mg total) by mouth every 8 (eight) hours as needed for nausea or vomiting. 04/15/24   Doretha Folks, MD  promethazine  (PHENERGAN ) 25 MG suppository Place 1 suppository (25 mg total) rectally every 6 (six) hours as needed for nausea or vomiting. 04/21/24   Silver Wonda LABOR, PA  promethazine  (PHENERGAN ) 25 MG tablet Take 1 tablet (25 mg total) by mouth every 6 (six) hours as needed for nausea or vomiting. 04/21/24   Silver Wonda LABOR, PA    Physical Exam: BP 134/84   Pulse 86   Temp 98.9 F (37.2 C) (Oral)   Resp 16   Ht 5' 11 (1.803 m)  Wt 96.9 kg   SpO2 97%   BMI 29.79 kg/m  General: Pleasant, well-appearing young man laying in bed. No acute distress. HEENT: Nathan Daugherty. Anicteric sclera CV: RRR. No murmurs, rubs, or gallops. No LE edema Pulmonary: Lungs CTAB. Normal effort. No wheezing or rales. Abdominal: Soft, nondistended.  Moderate tenderness to palpation of the lower abdomen, L>R. Normal bowel sounds. Extremities: Palpable radial  and DP pulses. Normal ROM. Skin: Warm and dry. No obvious rash or lesions. Neuro: A&Ox3. Moves all extremities. Normal sensation to light touch. No focal deficit. Psych: Normal mood and affect          Labs on Admission:  Basic Metabolic Panel: Recent Labs  Lab 05/12/24 1205  NA 137  K 3.9  CL 102  CO2 23  GLUCOSE 73  BUN 7  CREATININE 0.96  CALCIUM 9.9   Liver Function Tests: Recent Labs  Lab 05/12/24 1205  AST 21  ALT 27  ALKPHOS 115  BILITOT 0.4  PROT 7.9  ALBUMIN 4.8   Recent Labs  Lab 05/12/24 1205  LIPASE 30   No results for input(s): AMMONIA in the last 168 hours. CBC: Recent Labs  Lab 05/12/24 1205  WBC 18.2*  NEUTROABS 11.8*  HGB 15.6  HCT 44.6  MCV 82.9  PLT 388   Cardiac Enzymes: No results for input(s): CKTOTAL, CKMB, CKMBINDEX, TROPONINI in the last 168 hours. BNP (last 3 results) No results for input(s): BNP in the last 8760 hours.  ProBNP (last 3 results) No results for input(s): PROBNP in the last 8760 hours.  CBG: No results for input(s): GLUCAP in the last 168 hours.  Radiological Exams on Admission: CT ABDOMEN PELVIS W CONTRAST Result Date: 05/12/2024 CLINICAL DATA:  Left lower quadrant abdominal pain EXAM: CT ABDOMEN AND PELVIS WITH CONTRAST TECHNIQUE: Multidetector CT imaging of the abdomen and pelvis was performed using the standard protocol following bolus administration of intravenous contrast. RADIATION DOSE REDUCTION: This exam was performed according to the departmental dose-optimization program which includes automated exposure control, adjustment of the mA and/or kV according to patient size and/or use of iterative reconstruction technique. CONTRAST:  80mL OMNIPAQUE  IOHEXOL  300 MG/ML  SOLN COMPARISON:  04/15/2024 FINDINGS: Lower chest: No acute pleural or parenchymal lung disease. Hepatobiliary: No focal liver abnormality is seen. No gallstones, gallbladder wall thickening, or biliary dilatation. Pancreas:  Unremarkable. No pancreatic ductal dilatation or surrounding inflammatory changes. Spleen: Normal in size without focal abnormality. Adrenals/Urinary Tract: Adrenal glands are unremarkable. Kidneys are normal, without renal calculi, focal lesion, or hydronephrosis. Bladder is unremarkable. Stomach/Bowel: No bowel obstruction or ileus. Diverticulosis of the sigmoid colon, with short segment wall thickening and pericolonic fat stranding consistent with acute uncomplicated diverticulitis. No perforation, fluid collection, or abscess. Normal appendix right lower quadrant. Vascular/Lymphatic: No significant vascular findings are present. No enlarged abdominal or pelvic lymph nodes. Reproductive: Prostate is unremarkable. Other: No free fluid or free intraperitoneal gas. No abdominal wall hernia. Musculoskeletal: No acute or destructive bony abnormalities. Reconstructed images demonstrate no additional findings. IMPRESSION: 1. Acute uncomplicated sigmoid diverticulitis, with no evidence of perforation, fluid collection, or abscess. Electronically Signed   By: Ozell Daring M.D.   On: 05/12/2024 14:27   Assessment/Plan Nathan Daugherty is a 33 y.o. 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  # Acute sigmoid diverticulitis - Patient with history of Behcet's and  recent gastroenteritis now presenting with lower abdominal pain with mild nausea - CT A/P shows acute uncomplicated sigmoid diverticulitis but no perforations or abscess - Leukocytosis of 18,000 but remains afebrile and nontoxic appearing - Admit for pain control and IV antibiotics - Continue IV Rocephin  and Flagyl  - IV Dilaudid  as needed for pain - IV NS at 100 cc/h for 10 hours - Trend CBC, fever curve and follow-up blood cultures - Clear liquid  diet, advance as tolerated tomorrow  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 - Follows with rheumatology, GI and neurology at [facility] - Per chart review, patient has longstanding history of GI issues with intermittent diarrhea, lower abdominal discomfort and rectal bleeding that are often provoked by viral illness/sick episodes - Initially on rituximab  every 6 months, recently switched to every 4 months, last infusion in April 2025 - Pending EGD and colonoscopy in the outpatient by [facility]  # Hx of Migraine # Hx of seizure-like activity - History of focal impaired awareness seizures recently started on gabapentin  for headache and seizure prophylaxis by [facility] - Continue gabapentin  - Tylenol  as needed for headache   DVT prophylaxis: Lovenox      Code Status: Full Code  Consults called: None  Family Communication: Discussed admission with mother at bedside  Severity of Illness: The appropriate patient status for this patient is OBSERVATION. Observation status is judged to be reasonable and necessary in order to provide the required intensity of service to ensure the patient's safety. The patient's presenting symptoms, physical exam findings, and initial radiographic and laboratory data in the context of their medical condition is felt to place them at decreased risk for further clinical deterioration. Furthermore, it is anticipated that the patient will be medically stable for discharge from the hospital within 2 midnights of admission.   Level of care: Med-Surg   This record has been created using Conservation officer, historic buildings. Errors have been sought and corrected, but may not always be located. Such creation errors do not reflect on the standard of care.   Lou Claretta HERO, MD 05/12/2024, 8:42 PM [facility] Pager: [phone] Isaiah 41:10   If 7PM-7AM, please contact night-coverage [email or web address] Password [email or web address]

## 2024-05-12 NOTE — ED Notes (Signed)
 Report to [facility]link and  [facility] room 1535

## 2024-05-12 NOTE — ED Notes (Signed)
 To ct

## 2024-05-13 DIAGNOSIS — Z79899 Other long term (current) drug therapy: Secondary | ICD-10-CM | POA: Diagnosis not present

## 2024-05-13 DIAGNOSIS — Z885 Allergy status to narcotic agent status: Secondary | ICD-10-CM | POA: Diagnosis not present

## 2024-05-13 DIAGNOSIS — M352 Behcet's disease: Secondary | ICD-10-CM | POA: Diagnosis present

## 2024-05-13 DIAGNOSIS — Z8661 Personal history of infections of the central nervous system: Secondary | ICD-10-CM | POA: Diagnosis not present

## 2024-05-13 DIAGNOSIS — R109 Unspecified abdominal pain: Secondary | ICD-10-CM | POA: Diagnosis present

## 2024-05-13 DIAGNOSIS — Z833 Family history of diabetes mellitus: Secondary | ICD-10-CM | POA: Diagnosis not present

## 2024-05-13 DIAGNOSIS — K5792 Diverticulitis of intestine, part unspecified, without perforation or abscess without bleeding: Secondary | ICD-10-CM | POA: Diagnosis not present

## 2024-05-13 DIAGNOSIS — K5732 Diverticulitis of large intestine without perforation or abscess without bleeding: Secondary | ICD-10-CM | POA: Diagnosis present

## 2024-05-13 DIAGNOSIS — G43909 Migraine, unspecified, not intractable, without status migrainosus: Secondary | ICD-10-CM | POA: Diagnosis present

## 2024-05-13 DIAGNOSIS — Z87891 Personal history of nicotine dependence: Secondary | ICD-10-CM | POA: Diagnosis not present

## 2024-05-13 DIAGNOSIS — Z803 Family history of malignant neoplasm of breast: Secondary | ICD-10-CM | POA: Diagnosis not present

## 2024-05-13 DIAGNOSIS — Z801 Family history of malignant neoplasm of trachea, bronchus and lung: Secondary | ICD-10-CM | POA: Diagnosis not present

## 2024-05-13 LAB — CBC
HCT: 39.3 % (ref 39.0–52.0)
Hemoglobin: 13.5 g/dL (ref 13.0–17.0)
MCH: 29.3 pg (ref 26.0–34.0)
MCHC: 34.4 g/dL (ref 30.0–36.0)
MCV: 85.4 fL (ref 80.0–100.0)
Platelets: 283 K/uL (ref 150–400)
RBC: 4.6 MIL/uL (ref 4.22–5.81)
RDW: 12.8 % (ref 11.5–15.5)
WBC: 14.4 K/uL — ABNORMAL HIGH (ref 4.0–10.5)
nRBC: 0 % (ref 0.0–0.2)

## 2024-05-13 LAB — BASIC METABOLIC PANEL WITH GFR
Anion gap: 8 (ref 5–15)
BUN: 7 mg/dL (ref 6–20)
CO2: 25 mmol/L (ref 22–32)
Calcium: 9.1 mg/dL (ref 8.9–10.3)
Chloride: 103 mmol/L (ref 98–111)
Creatinine, Ser: 1.13 mg/dL (ref 0.61–1.24)
GFR, Estimated: >60 mL/min (ref >60)
Glucose, Bld: 85 mg/dL (ref 70–99)
Potassium: 4 mmol/L (ref 3.5–5.1)
Sodium: 136 mmol/L (ref 135–145)

## 2024-05-13 LAB — HIV ANTIBODY (ROUTINE TESTING W REFLEX): HIV Screen 4th Generation wRfx: NONREACTIVE

## 2024-05-13 MED ORDER — SODIUM CHLORIDE 0.9 % IV SOLN: INTRAVENOUS | Status: AC

## 2024-05-13 MED ORDER — OXYCODONE HCL 5 MG PO TABS
5.0000 mg | ORAL_TABLET | ORAL | Status: DC | PRN
  Administered 2024-05-13 – 2024-05-16 (×8): 5 mg via ORAL
  Filled 2024-05-13 (×8): qty 1

## 2024-05-13 MED ORDER — HYDROMORPHONE HCL 1 MG/ML IJ SOLN
0.5000 mg | INTRAMUSCULAR | Status: DC | PRN
  Administered 2024-05-13 – 2024-05-16 (×11): 0.5 mg via INTRAVENOUS
  Filled 2024-05-13 (×11): qty 0.5

## 2024-05-13 NOTE — TOC Initial Note (Signed)
 Transition of Care [facility]) - Initial/Assessment Note    Patient Details  Name: Nathan Daugherty MRN: [id number] Date of Birth: [birth year]  [facility] CM/SW Contact:    Doneta Glenys DASEN, RN Phone Number: 05/13/2024, 3:29 PM  Clinical Narrative:                 Presented for abdominal pain. CM introduced and explained role. Patient states he lives in a house with wife and children; Verified PCP/insurance;Denies DME, HH, oxygen and SDOH needs; Patient states that wife will transport home at discharge.  No CM needs identified during visit.  Expected Discharge Plan: Home/Self Care Barriers to Discharge: No Barriers Identified   Patient Goals and CMS Choice Patient states their goals for this hospitalization and ongoing recovery are:: Home with wife and children CMS Medicare.gov Compare Post Acute Care list provided to::  (NA) Choice offered to / list presented to : NA [place] ownership interest in [employer].provided to:: Parent NA    Expected Discharge Plan and Services In-house Referral: NA Discharge Planning Services: NA Post Acute Care Choice: NA Living arrangements for the past 2 months: Single Family Home                 DME Arranged: N/A DME Agency: NA       HH Arranged: NA HH Agency: NA        Prior Living Arrangements/Services Living arrangements for the past 2 months: Single Family Home Lives with:: Spouse Patient language and need for interpreter reviewed:: Yes Do you feel safe going back to the place where you live?: Yes      Need for Family Participation in Patient Care: No (Comment) Care giver support system in place?: Yes (comment) Current home services:  (NA) Criminal Activity/Legal Involvement Pertinent to Current Situation/Hospitalization: No - Comment as needed  Activities of Daily Living   ADL Screening (condition at time of admission) Independently performs ADLs?: Yes (appropriate for developmental age) Is the patient deaf  or have difficulty hearing?: No Does the patient have difficulty seeing, even when wearing glasses/contacts?: No Does the patient have difficulty concentrating, remembering, or making decisions?: No  Permission Sought/Granted Permission sought to share information with : Case Manager Permission granted to share information with : Yes, Verbal Permission Granted  Share Information with NAME: Nathan Daugherty mother [phone]           Emotional Assessment Appearance:: Appears stated age Attitude/Demeanor/Rapport: Engaged Affect (typically observed): Appropriate Orientation: : Oriented to Self, Oriented to Place, Oriented to  Time, Oriented to Situation Alcohol / Substance Use: Not Applicable Psych Involvement: No (comment)  Admission diagnosis:  Diverticulitis [K57.92] Patient Active Problem List   Diagnosis Date Noted   Diverticulitis 05/12/2024   Behcet's disease with multisystem involvement (HCC) 05/12/2024   Presumed seronegative Autoimmune encephalitis 05/24/2023   Chronic abdominal pain 01/16/2023   Migraines 01/16/2023   SIRS of non-infectious origin w acute organ dysfunction (HCC) 01/16/2023   Other irritable bowel syndrome 07/10/2022   Depression with anxiety 02/19/2021   Leukocytosis 02/19/2021   Hypokalemia 10/22/2016   PCP:  Patient, No Pcp Per Pharmacy:   [id number] - [place], [place] - [address] [place] [address] Phone: [phone] Fax: [phone]  [facility] [address] [place] [address] Phone: [phone] Fax: [phone]  [facility] - [place], [place] - [address] [place] [address] Phone: [phone] Fax: [phone]     Social Drivers of Health (SDOH) Social History: SDOH Screenings   Food Insecurity: No Food Insecurity (05/12/2024)  Housing: Low Risk  (05/12/2024)  Transportation Needs: No  Transportation Needs (05/12/2024)  Utilities: Not At Risk (05/12/2024)  Financial Resource Strain: Medium Risk (08/06/2022)   Received from [facility]  Social Connections: Moderately Integrated (05/12/2024)  Stress: No Stress Concern Present (02/19/2021)   Received from [facility]  Tobacco Use: Medium Risk (05/12/2024)   SDOH Interventions:     Readmission Risk Interventions     No data to display

## 2024-05-13 NOTE — Progress Notes (Signed)
 Pt declined lovenox  and was re-educated. He verbalizes understanding. Ambulation encouraged but pt declines. On-call provider notified.

## 2024-05-13 NOTE — Plan of Care (Signed)
  Problem: Education: Goal: Knowledge of General Education information will improve Description: Including pain rating scale, medication(s)/side effects and non-pharmacologic comfort measures Outcome: Progressing   Problem: Clinical Measurements: Goal: Ability to maintain clinical measurements within normal limits will improve Outcome: Progressing   Problem: Nutrition: Goal: Adequate nutrition will be maintained Outcome: Progressing   Problem: Pain Managment: Goal: General experience of comfort will improve and/or be controlled Outcome: Progressing   Problem: Skin Integrity: Goal: Risk for impaired skin integrity will decrease Outcome: Not Applicable

## 2024-05-13 NOTE — Plan of Care (Signed)

## 2024-05-13 NOTE — Progress Notes (Signed)
 PROGRESS NOTE    Nathan Daugherty  FMW:[id number] DOB: Mar 29, 1991 DOA: 05/12/2024 PCP: Patient, No Pcp Per    Brief Narrative:33 y.o. 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 Patient reports he was dealing with stomach bug few weeks ago with multiple watery stools per day. He has been feeling well over the last week however last night he started having extreme abdominal cramping.  Patient woke up with worsening abdominal pain and nausea.  No fever.  He was found to have leukocytosis   CT A/P shows acute uncomplicated sigmoid diverticulitis with no evidence of perforation or fluid collection or abscess.  Assessment & Plan:   Principal Problem:   Diverticulitis Active Problems:   Behcet's disease with multisystem involvement (HCC)   # Acute sigmoid diverticulitis - Patient with history of Behcet's and recent gastroenteritis now presenting with lower abdominal pain with mild nausea - CT A/P shows acute uncomplicated sigmoid diverticulitis but no perforations or abscess - Leukocytosis improved - Continue IV Rocephin  and Flagyl  - IV Dilaudid  as needed for pain - IV NS at 100 cc/h  - Trend CBC, fever curve and follow-up blood cultures - Clear liquid diet   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 - Follows with rheumatology, GI and neurology at [facility] - Per chart review, patient has longstanding history of GI issues with intermittent diarrhea, lower abdominal discomfort and rectal bleeding that are often provoked by viral illness/sick episodes - Initially on rituximab  every 6 months, recently switched to every 4 months, last infusion in April 2025 - Pending EGD and colonoscopy in the outpatient by [facility]   # Hx of Migraine # Hx of seizure-like activity - History of focal impaired awareness seizures  recently started on gabapentin  for headache and seizure prophylaxis by [facility] - Continue gabapentin  - Tylenol  as needed for headache     Estimated body mass index is 29.79 kg/m as calculated from the following:   Height as of this encounter: 5' 11 (1.803 m).   Weight as of this encounter: 96.9 kg.  DVT prophylaxis: scd refuses lovenox  Code Status: full Family Communication:wife at bed side Disposition Plan:  Status is: Observation The patient remains OBS appropriate and will d/c before 2 midnights.   Consultants:  none  Procedures:none Antimicrobials:rocephin  flagyl   Subjective:  C/o severe pain and tenderness On ivf  Has not tried clears due to pain Objective: Vitals:   05/12/24 1621 05/12/24 1734 05/12/24 2128 05/13/24 0207  BP:  134/84 122/76 (!) 111/59  Pulse:  86 90 74  Resp:  16 18 20   Temp: 98.5 F (36.9 C) 98.9 F (37.2 C) 98.5 F (36.9 C) 99 F (37.2 C)  TempSrc: Oral Oral Oral Oral  SpO2:  97% 96% 98%  Weight:  96.9 kg    Height:  5' 11 (1.803 m)      Intake/Output Summary (Last 24 hours) at 05/13/2024 0916 Last data filed at 05/13/2024 0916 Gross per 24 hour  Intake 100 ml  Output 900 ml  Net -800 ml   Filed Weights   05/12/24 1154 05/12/24 1734  Weight: 88.5 kg 96.9 kg    Examination:  General exam: Appears in distress Respiratory system: Clear to auscultation. Respiratory effort normal. Cardiovascular system: reg Gastrointestinal system: Abdomen is nondistended, soft and left tender. No organomegaly or masses felt. Normal bowel sounds heard. Central nervous system:  Alert and oriented. No focal neurological deficits. Extremities: Symmetric 5 x 5 power.  Data Reviewed: I have personally reviewed following labs and imaging studies  CBC: Recent Labs  Lab 05/12/24 1205 05/13/24 0433  WBC 18.2* 14.4*  NEUTROABS 11.8*  --   HGB 15.6 13.5  HCT 44.6 39.3  MCV 82.9 85.4  PLT 388 283   Basic Metabolic Panel: Recent Labs  Lab  05/12/24 1205 05/13/24 0433  NA 137 136  K 3.9 4.0  CL 102 103  CO2 23 25  GLUCOSE 73 85  BUN 7 7  CREATININE 0.96 1.13  CALCIUM 9.9 9.1   GFR: Estimated Creatinine Clearance: 111.4 mL/min (by C-G formula based on SCr of 1.13 mg/dL). Liver Function Tests: Recent Labs  Lab 05/12/24 1205  AST 21  ALT 27  ALKPHOS 115  BILITOT 0.4  PROT 7.9  ALBUMIN 4.8   Recent Labs  Lab 05/12/24 1205  LIPASE 30   No results for input(s): AMMONIA in the last 168 hours. Coagulation Profile: No results for input(s): INR, PROTIME in the last 168 hours. Cardiac Enzymes: No results for input(s): CKTOTAL, CKMB, CKMBINDEX, TROPONINI in the last 168 hours. BNP (last 3 results) No results for input(s): PROBNP in the last 8760 hours. HbA1C: No results for input(s): HGBA1C in the last 72 hours. CBG: No results for input(s): GLUCAP in the last 168 hours. Lipid Profile: No results for input(s): CHOL, HDL, LDLCALC, TRIG, CHOLHDL, LDLDIRECT in the last 72 hours. Thyroid  Function Tests: No results for input(s): TSH, T4TOTAL, FREET4, T3FREE, THYROIDAB in the last 72 hours. Anemia Panel: No results for input(s): VITAMINB12, FOLATE, FERRITIN, TIBC, IRON, RETICCTPCT in the last 72 hours. Sepsis Labs: Recent Labs  Lab 05/12/24 1205  LATICACIDVEN 1.4    Recent Results (from the past 240 hours)  Blood culture (routine x 2)     Status: None (Preliminary result)   Collection Time: 05/12/24  5:44 PM   Specimen: BLOOD RIGHT ARM  Result Value Ref Range Status   Specimen Description   Final    BLOOD RIGHT ARM Performed at [facility], [address] [place], [place] [address]    Special Requests   Final    BOTTLES DRAWN AEROBIC AND ANAEROBIC Blood Culture adequate volume Performed at [facility], [address], [place], [place] [address]    Culture PENDING  Incomplete   Report Status PENDING  Incomplete          Radiology Studies: CT ABDOMEN PELVIS W CONTRAST Result Date: 05/12/2024 CLINICAL DATA:  Left lower quadrant abdominal pain EXAM: CT ABDOMEN AND PELVIS WITH CONTRAST TECHNIQUE: Multidetector CT imaging of the abdomen and pelvis was performed using the standard protocol following bolus administration of intravenous contrast. RADIATION DOSE REDUCTION: This exam was performed according to the departmental dose-optimization program which includes automated exposure control, adjustment of the mA and/or kV according to patient size and/or use of iterative reconstruction technique. CONTRAST:  80mL OMNIPAQUE  IOHEXOL  300 MG/ML  SOLN COMPARISON:  04/15/2024 FINDINGS: Lower chest: No acute pleural or parenchymal lung disease. Hepatobiliary: No focal liver abnormality is seen. No gallstones, gallbladder wall thickening, or biliary dilatation. Pancreas: Unremarkable. No pancreatic ductal dilatation or surrounding inflammatory changes. Spleen: Normal in size without focal abnormality. Adrenals/Urinary Tract: Adrenal glands are unremarkable. Kidneys are normal, without renal calculi, focal lesion, or hydronephrosis. Bladder is unremarkable. Stomach/Bowel: No bowel obstruction or ileus. Diverticulosis of the sigmoid colon, with short segment wall thickening and pericolonic fat stranding consistent  with acute uncomplicated diverticulitis. No perforation, fluid collection, or abscess. Normal appendix right lower quadrant. Vascular/Lymphatic: No significant vascular findings are present. No enlarged abdominal or pelvic lymph nodes. Reproductive: Prostate is unremarkable. Other: No free fluid or free intraperitoneal gas. No abdominal wall hernia. Musculoskeletal: No acute or destructive bony abnormalities. Reconstructed images demonstrate no additional findings. IMPRESSION: 1. Acute uncomplicated sigmoid diverticulitis, with no evidence of perforation, fluid collection, or abscess. Electronically Signed   By: Ozell Daring  M.D.   On: 05/12/2024 14:27    Scheduled Meds:  enoxaparin  (LOVENOX ) injection  40 mg Subcutaneous Q24H   gabapentin   300 mg Oral QHS   Continuous Infusions:  cefTRIAXone  (ROCEPHIN )  IV     metronidazole  500 mg (05/13/24 0558)     LOS: 0 days    Time spent: 39 min  Almarie KANDICE Hoots, MD  05/13/2024, 9:16 AM

## 2024-05-13 NOTE — Progress Notes (Signed)
 Pt declined lovenox . He was educated on medication indications and verbalizes undertanding. Ambulation encouraged but declined. On-call provider notified of refusal.

## 2024-05-14 DIAGNOSIS — K5792 Diverticulitis of intestine, part unspecified, without perforation or abscess without bleeding: Secondary | ICD-10-CM | POA: Diagnosis not present

## 2024-05-14 LAB — COMPREHENSIVE METABOLIC PANEL WITH GFR
ALT: 16 U/L (ref 0–44)
AST: 12 U/L — ABNORMAL LOW (ref 15–41)
Albumin: 3.4 g/dL — ABNORMAL LOW (ref 3.5–5.0)
Alkaline Phosphatase: 70 U/L (ref 38–126)
Anion gap: 9 (ref 5–15)
BUN: 7 mg/dL (ref 6–20)
CO2: 24 mmol/L (ref 22–32)
Calcium: 9.2 mg/dL (ref 8.9–10.3)
Chloride: 105 mmol/L (ref 98–111)
Creatinine, Ser: 1.22 mg/dL (ref 0.61–1.24)
GFR, Estimated: >60 mL/min (ref >60)
Glucose, Bld: 86 mg/dL (ref 70–99)
Potassium: 4.2 mmol/L (ref 3.5–5.1)
Sodium: 138 mmol/L (ref 135–145)
Total Bilirubin: 0.8 mg/dL (ref 0.0–1.2)
Total Protein: 6.8 g/dL (ref 6.5–8.1)

## 2024-05-14 LAB — CBC
HCT: 41 % (ref 39.0–52.0)
Hemoglobin: 13.4 g/dL (ref 13.0–17.0)
MCH: 28.3 pg (ref 26.0–34.0)
MCHC: 32.7 g/dL (ref 30.0–36.0)
MCV: 86.5 fL (ref 80.0–100.0)
Platelets: 295 K/uL (ref 150–400)
RBC: 4.74 MIL/uL (ref 4.22–5.81)
RDW: 12.6 % (ref 11.5–15.5)
WBC: 11.9 K/uL — ABNORMAL HIGH (ref 4.0–10.5)
nRBC: 0 % (ref 0.0–0.2)

## 2024-05-14 NOTE — Plan of Care (Signed)

## 2024-05-14 NOTE — Progress Notes (Signed)
 PROGRESS NOTE    Nathan Daugherty  FMW:[id number] DOB: September 09, 1991 DOA: 05/12/2024 PCP: Patient, No Pcp Per    Brief Narrative:33 y.o. 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 Patient reports he was dealing with stomach bug few weeks ago with multiple watery stools per day. He has been feeling well over the last week however last night he started having extreme abdominal cramping.  Patient woke up with worsening abdominal pain and nausea.  No fever.  He was found to have leukocytosis   CT A/P shows acute uncomplicated sigmoid diverticulitis with no evidence of perforation or fluid collection or abscess.  Assessment & Plan:   Principal Problem:   Diverticulitis Active Problems:   Behcet's disease with multisystem involvement (HCC)   # Acute sigmoid diverticulitis - Patient with history of Behcet's and recent gastroenteritis now presenting with lower abdominal pain with mild nausea - CT A/P shows acute uncomplicated sigmoid diverticulitis but no perforations or abscess - Leukocytosis improved to 11.9 - Continue IV Rocephin  and Flagyl  - IV Dilaudid  as needed for pain - IV NS at 100 cc/h  -Has been afebrile with leukocytosis improving blood cultures remaining negative advancing diet to full liquid diet  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 - Follows with rheumatology, GI and neurology at [facility] - Per chart review, patient has longstanding history of GI issues with intermittent diarrhea, lower abdominal discomfort and rectal bleeding that are often provoked by viral illness/sick episodes - Initially on rituximab  every 6 months, recently switched to every 4 months, last infusion in April 2025 - Pending EGD and colonoscopy in the outpatient by [facility]   # Hx of Migraine # Hx of seizure-like activity -  History of focal impaired awareness seizures recently started on gabapentin  for headache and seizure prophylaxis by [facility] - Continue gabapentin  - Tylenol  as needed for headache     Estimated body mass index is 29.79 kg/m as calculated from the following:   Height as of this encounter: 5' 11 (1.803 m).   Weight as of this encounter: 96.9 kg.  DVT prophylaxis: scd refuses lovenox  Code Status: full Family Communication:wife at bed side Disposition Plan:  Status is: Observation The patient remains OBS appropriate and will d/c before 2 midnights.   Consultants:  none  Procedures:none Antimicrobials:rocephin  flagyl   Subjective:  Still having a lot of pain does not feel he is ready to start a soft diet but okay with increasing to full liquid diet for today   objective: Vitals:   05/13/24 1249 05/13/24 1951 05/14/24 0416 05/14/24 1146  BP: 117/77 129/84 124/78 132/82  Pulse: 86 85 74 81  Resp: 18  16 16   Temp: 98.4 F (36.9 C) 99.2 F (37.3 C) 98.4 F (36.9 C) 98.1 F (36.7 C)  TempSrc: Oral Oral  Oral  SpO2: 97% 97% 99%   Weight:      Height:        Intake/Output Summary (Last 24 hours) at 05/14/2024 1359 Last data filed at 05/14/2024 0846 Gross per 24 hour  Intake 1147.41 ml  Output 1400 ml  Net -252.59 ml   Filed Weights   05/12/24 1154 05/12/24 1734  Weight: 88.5 kg 96.9 kg    Examination:  General exam: Appears in distress Respiratory system: Clear to auscultation. Respiratory effort normal. Cardiovascular system: reg Gastrointestinal system: Abdomen is nondistended, soft and left  tender. No organomegaly or masses felt. Normal bowel sounds heard. Central nervous system: Alert and oriented. No focal neurological deficits. Extremities: Symmetric 5 x 5 power.  Data Reviewed: I have personally reviewed following labs and imaging studies  CBC: Recent Labs  Lab 05/12/24 1205 05/13/24 0433 05/14/24 0357  WBC 18.2* 14.4* 11.9*  NEUTROABS 11.8*  --    --   HGB 15.6 13.5 13.4  HCT 44.6 39.3 41.0  MCV 82.9 85.4 86.5  PLT 388 283 295   Basic Metabolic Panel: Recent Labs  Lab 05/12/24 1205 05/13/24 0433 05/14/24 0357  NA 137 136 138  K 3.9 4.0 4.2  CL 102 103 105  CO2 23 25 24   GLUCOSE 73 85 86  BUN 7 7 7   CREATININE 0.96 1.13 1.22  CALCIUM 9.9 9.1 9.2   GFR: Estimated Creatinine Clearance: 103.2 mL/min (by C-G formula based on SCr of 1.22 mg/dL). Liver Function Tests: Recent Labs  Lab 05/12/24 1205 05/14/24 0357  AST 21 12*  ALT 27 16  ALKPHOS 115 70  BILITOT 0.4 0.8  PROT 7.9 6.8  ALBUMIN 4.8 3.4*   Recent Labs  Lab 05/12/24 1205  LIPASE 30   No results for input(s): AMMONIA in the last 168 hours. Coagulation Profile: No results for input(s): INR, PROTIME in the last 168 hours. Cardiac Enzymes: No results for input(s): CKTOTAL, CKMB, CKMBINDEX, TROPONINI in the last 168 hours. BNP (last 3 results) No results for input(s): PROBNP in the last 8760 hours. HbA1C: No results for input(s): HGBA1C in the last 72 hours. CBG: No results for input(s): GLUCAP in the last 168 hours. Lipid Profile: No results for input(s): CHOL, HDL, LDLCALC, TRIG, CHOLHDL, LDLDIRECT in the last 72 hours. Thyroid  Function Tests: No results for input(s): TSH, T4TOTAL, FREET4, T3FREE, THYROIDAB in the last 72 hours. Anemia Panel: No results for input(s): VITAMINB12, FOLATE, FERRITIN, TIBC, IRON, RETICCTPCT in the last 72 hours. Sepsis Labs: Recent Labs  Lab 05/12/24 1205  LATICACIDVEN 1.4    Recent Results (from the past 240 hours)  Blood culture (routine x 2)     Status: None (Preliminary result)   Collection Time: 05/12/24 12:05 PM   Specimen: BLOOD  Result Value Ref Range Status   Specimen Description   Final    BLOOD SITE NOT SPECIFIED Performed at [facility], [address] [place], [place] [address]    Special Requests   Final    BOTTLES DRAWN  AEROBIC AND ANAEROBIC Blood Culture adequate volume Performed at [facility], [address] [place], [place] [address]    Culture   Final    NO GROWTH 2 DAYS Performed at [facility], [address], [place], [place] [address]    Report Status PENDING  Incomplete  Blood culture (routine x 2)     Status: None (Preliminary result)   Collection Time: 05/12/24  5:44 PM   Specimen: BLOOD RIGHT ARM  Result Value Ref Range Status   Specimen Description   Final    BLOOD RIGHT ARM Performed at [facility], [address], [place], [place] [address]    Special Requests   Final    BOTTLES DRAWN AEROBIC AND ANAEROBIC Blood Culture adequate volume Performed at [facility], [address], [place], [place] [address]    Culture   Final    NO GROWTH 2 DAYS Performed at [facility], [address], [place], [place] [address]  Report Status PENDING  Incomplete         Radiology Studies: No results found.   Scheduled Meds:  enoxaparin  (LOVENOX ) injection  40 mg Subcutaneous Q24H   gabapentin   300 mg Oral QHS   Continuous Infusions:  cefTRIAXone  (ROCEPHIN )  IV Stopped (05/13/24 1457)   metronidazole  500 mg (05/14/24 0422)     LOS: 1 day    Time spent: 39 min  Almarie KANDICE Hoots, MD  05/14/2024, 1:59 PM

## 2024-05-15 DIAGNOSIS — K5792 Diverticulitis of intestine, part unspecified, without perforation or abscess without bleeding: Secondary | ICD-10-CM | POA: Diagnosis not present

## 2024-05-15 MED ORDER — IBUPROFEN 200 MG PO TABS
400.0000 mg | ORAL_TABLET | Freq: Four times a day (QID) | ORAL | Status: DC | PRN
  Administered 2024-05-15 (×2): 400 mg via ORAL
  Filled 2024-05-15 (×2): qty 2

## 2024-05-15 NOTE — Progress Notes (Addendum)
 Patient called to nursing station through desk telephone to voice concerns about timing of care today; patient has called to request IV pain medication on exact PRN available times and is now requesting PO pain medication on exact PRN timing; primary RN attentive to patient needs during the 12 hour day shift; patient expressing a demanding tone when speaking with staff; patient refusing to ambulate to bathroom or in hallway; educated and encouraged on need to ambulate and receive Lovenox  injection to assist in preventing in blood clots; patient is alert and oriented and able to ambulate; tolerating diet with no nausea or vomiting

## 2024-05-15 NOTE — Plan of Care (Signed)
  Problem: Coping: Goal: Level of anxiety will decrease Outcome: Progressing   Problem: Nutrition: Goal: Adequate nutrition will be maintained Outcome: Progressing   Problem: Activity: Goal: Risk for activity intolerance will decrease Outcome: Progressing   Problem: Pain Managment: Goal: General experience of comfort will improve and/or be controlled Outcome: Progressing

## 2024-05-15 NOTE — Progress Notes (Signed)
 PROGRESS NOTE    Nathan Daugherty  FMW:[id number] DOB: 09-10-1991 DOA: 05/12/2024 PCP: Patient, No Pcp Per    Brief Narrative:33 y.o. 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 Patient reports he was dealing with stomach bug few weeks ago with multiple watery stools per day. He has been feeling well over the last week however last night he started having extreme abdominal cramping.  Patient woke up with worsening abdominal pain and nausea.  No fever.  He was found to have leukocytosis   CT A/P shows acute uncomplicated sigmoid diverticulitis with no evidence of perforation or fluid collection or abscess.  Assessment & Plan:   Principal Problem:   Diverticulitis Active Problems:   Behcet's disease with multisystem involvement (HCC)   # Acute sigmoid diverticulitis - Patient with history of Behcet's and recent gastroenteritis now presenting with lower abdominal pain with mild nausea - CT A/P shows acute uncomplicated sigmoid diverticulitis but no perforations or abscess - Leukocytosis improved to 11.9 - Continue IV Rocephin  and Flagyl  - IV Dilaudid  as needed for pain - IV NS at 100 cc/h  -Has been afebrile with leukocytosis improving blood cultures remaining negative advancing diet to full liquid diet  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 - Follows with rheumatology, GI and neurology at [facility] - Per chart review, patient has longstanding history of GI issues with intermittent diarrhea, lower abdominal discomfort and rectal bleeding that are often provoked by viral illness/sick episodes - Initially on rituximab  every 6 months, recently switched to every 4 months, last infusion in April 2025 - Pending EGD and colonoscopy in the outpatient by [facility]   # Hx of Migraine # Hx of seizure-like activity -  History of focal impaired awareness seizures recently started on gabapentin  for headache and seizure prophylaxis by [facility] - Continue gabapentin  - Tylenol  as needed for headache     Estimated body mass index is 29.79 kg/m as calculated from the following:   Height as of this encounter: 5' 11 (1.803 m).   Weight as of this encounter: 96.9 kg.  DVT prophylaxis: scd refuses lovenox  Code Status: full Family Communication:wife at bed side Disposition Plan:  Status is: Observation The patient remains OBS appropriate and will d/c before 2 midnights.   Consultants:  none  Procedures:none Antimicrobials:rocephin  flagyl   Subjective:  A lot of pain still on clears will advance diet to soft diet when he thinks he is ready objective: Vitals:   05/14/24 1146 05/14/24 2002 05/15/24 0521 05/15/24 1225  BP: 132/82 118/75 111/76 132/86  Pulse: 81 82 72 83  Resp: 16   16  Temp: 98.1 F (36.7 C) 98.6 F (37 C) 98 F (36.7 C) 98.7 F (37.1 C)  TempSrc: Oral   Oral  SpO2:  96% 96% 95%  Weight:      Height:        Intake/Output Summary (Last 24 hours) at 05/15/2024 1511 Last data filed at 05/14/2024 2205 Gross per 24 hour  Intake 120 ml  Output 700 ml  Net -580 ml   Filed Weights   05/12/24 1154 05/12/24 1734  Weight: 88.5 kg 96.9 kg    Examination:  General exam: Appears in distress Respiratory system: Clear to auscultation. Respiratory effort normal. Cardiovascular system: reg Gastrointestinal system: Abdomen is nondistended, soft and left tender. No organomegaly or masses felt. Normal bowel sounds heard.  Central nervous system: Alert and oriented. No focal neurological deficits. Extremities: Symmetric 5 x 5 power.  Data Reviewed: I have personally reviewed following labs and imaging studies  CBC: Recent Labs  Lab 05/12/24 1205 05/13/24 0433 05/14/24 0357  WBC 18.2* 14.4* 11.9*  NEUTROABS 11.8*  --   --   HGB 15.6 13.5 13.4  HCT 44.6 39.3 41.0  MCV 82.9  85.4 86.5  PLT 388 283 295   Basic Metabolic Panel: Recent Labs  Lab 05/12/24 1205 05/13/24 0433 05/14/24 0357  NA 137 136 138  K 3.9 4.0 4.2  CL 102 103 105  CO2 23 25 24   GLUCOSE 73 85 86  BUN 7 7 7   CREATININE 0.96 1.13 1.22  CALCIUM 9.9 9.1 9.2   GFR: Estimated Creatinine Clearance: 103.2 mL/min (by C-G formula based on SCr of 1.22 mg/dL). Liver Function Tests: Recent Labs  Lab 05/12/24 1205 05/14/24 0357  AST 21 12*  ALT 27 16  ALKPHOS 115 70  BILITOT 0.4 0.8  PROT 7.9 6.8  ALBUMIN 4.8 3.4*   Recent Labs  Lab 05/12/24 1205  LIPASE 30   No results for input(s): AMMONIA in the last 168 hours. Coagulation Profile: No results for input(s): INR, PROTIME in the last 168 hours. Cardiac Enzymes: No results for input(s): CKTOTAL, CKMB, CKMBINDEX, TROPONINI in the last 168 hours. BNP (last 3 results) No results for input(s): PROBNP in the last 8760 hours. HbA1C: No results for input(s): HGBA1C in the last 72 hours. CBG: No results for input(s): GLUCAP in the last 168 hours. Lipid Profile: No results for input(s): CHOL, HDL, LDLCALC, TRIG, CHOLHDL, LDLDIRECT in the last 72 hours. Thyroid  Function Tests: No results for input(s): TSH, T4TOTAL, FREET4, T3FREE, THYROIDAB in the last 72 hours. Anemia Panel: No results for input(s): VITAMINB12, FOLATE, FERRITIN, TIBC, IRON, RETICCTPCT in the last 72 hours. Sepsis Labs: Recent Labs  Lab 05/12/24 1205  LATICACIDVEN 1.4    Recent Results (from the past 240 hours)  Blood culture (routine x 2)     Status: None (Preliminary result)   Collection Time: 05/12/24 12:05 PM   Specimen: BLOOD  Result Value Ref Range Status   Specimen Description   Final    BLOOD SITE NOT SPECIFIED Performed at [facility], [address] [place], [place] [address]    Special Requests   Final    BOTTLES DRAWN AEROBIC AND ANAEROBIC Blood Culture adequate volume Performed  at [facility], [address] [place], [place] [address]    Culture   Final    NO GROWTH 3 DAYS Performed at [facility], [address], [place], [place] [address]    Report Status PENDING  Incomplete  Blood culture (routine x 2)     Status: None (Preliminary result)   Collection Time: 05/12/24  5:44 PM   Specimen: BLOOD RIGHT ARM  Result Value Ref Range Status   Specimen Description   Final    BLOOD RIGHT ARM Performed at [facility], [address], [place], [place] [address]    Special Requests   Final    BOTTLES DRAWN AEROBIC AND ANAEROBIC Blood Culture adequate volume Performed at [facility], [address] [place], [place] [address]    Culture   Final    NO GROWTH 3 DAYS Performed at [facility], [address] [place], [place] [address]    Report Status PENDING  Incomplete  Radiology Studies: No results found.   Scheduled Meds:  enoxaparin  (LOVENOX ) injection  40 mg Subcutaneous Q24H   gabapentin   300 mg Oral QHS   Continuous Infusions:  cefTRIAXone  (ROCEPHIN )  IV 2 g (05/14/24 1426)   metronidazole  500 mg (05/15/24 0416)     LOS: 2 days    Time spent: 39 min  Almarie KANDICE Hoots, MD  05/15/2024, 3:11 PM

## 2024-05-15 NOTE — Progress Notes (Signed)
 New iv site placed in right upper arm by IV team earlier, patient received a dose of dilaudid  at 2015 for lower abdominal pain, lovenox  refused and provider notified, encouraged ambulation as much as possible however patient says it makes the pain worse, patient has been sleeping soundly for the past 2 hours.

## 2024-05-15 NOTE — Plan of Care (Signed)

## 2024-05-16 DIAGNOSIS — K5792 Diverticulitis of intestine, part unspecified, without perforation or abscess without bleeding: Secondary | ICD-10-CM | POA: Diagnosis not present

## 2024-05-16 MED ORDER — ONDANSETRON HCL 4 MG PO TABS: 4.0000 mg | ORAL_TABLET | Freq: Four times a day (QID) | ORAL | 0 refills | Status: DC | PRN

## 2024-05-16 MED ORDER — OXYCODONE HCL 5 MG PO TABS: 5.0000 mg | ORAL_TABLET | Freq: Three times a day (TID) | ORAL | 0 refills | Status: DC | PRN

## 2024-05-16 MED ORDER — ACETAMINOPHEN 325 MG PO TABS: 650.0000 mg | ORAL_TABLET | Freq: Four times a day (QID) | ORAL | Status: AC | PRN

## 2024-05-16 MED ORDER — SENNOSIDES-DOCUSATE SODIUM 8.6-50 MG PO TABS: 1.0000 | ORAL_TABLET | Freq: Every evening | ORAL | Status: AC | PRN

## 2024-05-16 MED ORDER — BISACODYL 5 MG PO TBEC: 5.0000 mg | DELAYED_RELEASE_TABLET | Freq: Every day | ORAL | 0 refills | Status: AC | PRN

## 2024-05-16 NOTE — Discharge Summary (Signed)
 Physician Discharge Summary  Nathan Daugherty FMW:[id number] DOB: 12-26-1990 DOA: 05/12/2024  PCP: Patient, No Pcp Per  Admit date: 05/12/2024 Discharge date: 05/16/2024  Admitted From: Home Disposition: Home Recommendations for Outpatient Follow-up:  Follow up with PCP in 1-2 weeks Please obtain BMP/CBC in one week Please follow up GI  Home Health: None Equipment/Devices: None  discharge Condition: Stable CODE STATUS: Full code Diet recommendation: Cardiac  Brief/Interim Summary:  33 y.o. 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 Patient reports he was dealing with stomach bug few weeks ago with multiple watery stools per day. He has been feeling well over the last week however last night he started having extreme abdominal cramping.  Patient woke up with worsening abdominal pain and nausea.  No fever.  He was found to have leukocytosis   CT A/P shows acute uncomplicated sigmoid diverticulitis with no evidence of perforation or fluid collection or abscess.    Discharge Diagnoses:  Principal Problem:   Diverticulitis Active Problems:   Behcet's disease with multisystem involvement (HCC)    # Acute sigmoid diverticulitis - Patient with history of Behcet's and recent gastroenteritis now presenting with lower abdominal pain with mild nausea - CT A/P shows acute uncomplicated sigmoid diverticulitis but no perforations or abscess.  He was treated with Rocephin  and Flagyl  Dilaudid  and oxycodone .  Symptoms improved with the above treatments.  He was able to tolerate a soft diet prior to discharge.  He was discharged on Augmentin and oxycodone .  Leukocytosis improved to 11.9 on discharge.   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 - Follows with rheumatology, GI and neurology at [facility] - Per chart review,  patient has longstanding history of GI issues with intermittent diarrhea, lower abdominal discomfort and rectal bleeding that are often provoked by viral illness/sick episodes - Initially on rituximab  every 6 months, recently switched to every 4 months, last infusion in April 2025 - Pending EGD and colonoscopy in the outpatient by [facility]   # Hx of Migraine # Hx of seizure-like activity - History of focal impaired awareness seizures recently started on gabapentin  for headache and seizure prophylaxis by [facility] - Continue gabapentin  - Tylenol  as needed for headache   Estimated body mass index is 29.79 kg/m as calculated from the following:   Height as of this encounter: 5' 11 (1.803 m).   Weight as of this encounter: 96.9 kg.  Discharge Instructions  Discharge Instructions     Diet - low sodium heart healthy   Complete by: As directed    Increase activity slowly   Complete by: As directed    Recheck vitals   Complete by: As directed       Allergies as of 05/16/2024       Reactions   Toradol  [ketorolac  Tromethamine ] Photosensitivity   Migraines   Tramadol Photosensitivity, Other (See Comments)   AMS, Migraines        Medication List     STOP taking these medications    clindamycin 300 MG capsule Commonly known as: CLEOCIN   imiquimod 5 % cream Commonly known as: ALDARA   ondansetron  4 MG disintegrating tablet Commonly known as: ZOFRAN -ODT   predniSONE  10 MG tablet Commonly known as: DELTASONE    promethazine  25 MG suppository Commonly known as: PHENERGAN    promethazine  25 MG tablet Commonly known as: PHENERGAN   TAKE these medications    acetaminophen  325 MG tablet Commonly known as: TYLENOL  Take 2 tablets (650 mg total) by mouth every 6 (six) hours as needed for mild pain (pain score 1-3) or fever (or Fever >/= 101).   bisacodyl  5 MG EC tablet Commonly known as: DULCOLAX Take 1 tablet (5 mg total) by mouth daily as needed for moderate  constipation.   gabapentin  300 MG capsule Commonly known as: NEURONTIN  Take 300 mg by mouth at bedtime.   ibuprofen  200 MG tablet Commonly known as: ADVIL  Take 800 mg by mouth daily as needed for mild pain (pain score 1-3) or moderate pain (pain score 4-6).   ondansetron  4 MG tablet Commonly known as: ZOFRAN  Take 1 tablet (4 mg total) by mouth every 6 (six) hours as needed for nausea.   oxyCODONE  5 MG immediate release tablet Commonly known as: Roxicodone  Take 1 tablet (5 mg total) by mouth every 8 (eight) hours as needed.   Rituxan  500 MG/50ML injection Generic drug: riTUXimab  Inject into the vein See admin instructions. Every 4 months   senna-docusate 8.6-50 MG tablet Commonly known as: Senokot-S Take 1 tablet by mouth at bedtime as needed for mild constipation.        Allergies  Allergen Reactions   Toradol  [Ketorolac  Tromethamine ] Photosensitivity    Migraines   Tramadol Photosensitivity and Other (See Comments)    AMS, Migraines    Consultations:none   Procedures/Studies: CT ABDOMEN PELVIS W CONTRAST Result Date: 05/12/2024 CLINICAL DATA:  Left lower quadrant abdominal pain EXAM: CT ABDOMEN AND PELVIS WITH CONTRAST TECHNIQUE: Multidetector CT imaging of the abdomen and pelvis was performed using the standard protocol following bolus administration of intravenous contrast. RADIATION DOSE REDUCTION: This exam was performed according to the departmental dose-optimization program which includes automated exposure control, adjustment of the mA and/or kV according to patient size and/or use of iterative reconstruction technique. CONTRAST:  80mL OMNIPAQUE  IOHEXOL  300 MG/ML  SOLN COMPARISON:  04/15/2024 FINDINGS: Lower chest: No acute pleural or parenchymal lung disease. Hepatobiliary: No focal liver abnormality is seen. No gallstones, gallbladder wall thickening, or biliary dilatation. Pancreas: Unremarkable. No pancreatic ductal dilatation or surrounding inflammatory changes.  Spleen: Normal in size without focal abnormality. Adrenals/Urinary Tract: Adrenal glands are unremarkable. Kidneys are normal, without renal calculi, focal lesion, or hydronephrosis. Bladder is unremarkable. Stomach/Bowel: No bowel obstruction or ileus. Diverticulosis of the sigmoid colon, with short segment wall thickening and pericolonic fat stranding consistent with acute uncomplicated diverticulitis. No perforation, fluid collection, or abscess. Normal appendix right lower quadrant. Vascular/Lymphatic: No significant vascular findings are present. No enlarged abdominal or pelvic lymph nodes. Reproductive: Prostate is unremarkable. Other: No free fluid or free intraperitoneal gas. No abdominal wall hernia. Musculoskeletal: No acute or destructive bony abnormalities. Reconstructed images demonstrate no additional findings. IMPRESSION: 1. Acute uncomplicated sigmoid diverticulitis, with no evidence of perforation, fluid collection, or abscess. Electronically Signed   By: Ozell Daring M.D.   On: 05/12/2024 14:27   (Echo, Carotid, EGD, Colonoscopy, ERCP)    Subjective:  Anxious to go home tolerated diet Discharge Exam: Vitals:   05/16/24 0408 05/16/24 1210  BP: 126/77 132/78  Pulse: 76 85  Resp: 18   Temp: 97.9 F (36.6 C) 98.1 F (36.7 C)  SpO2: 98% 98%   Vitals:   05/15/24 1225 05/15/24 1956 05/16/24 0408 05/16/24 1210  BP: 132/86 136/77 126/77 132/78  Pulse: 83 81 76 85  Resp: 16 18 18    Temp: 98.7 F (37.1 C) 97.6 F (36.4  C) 97.9 F (36.6 C) 98.1 F (36.7 C)  TempSrc: Oral  Oral Oral  SpO2: 95% 96% 98% 98%  Weight:      Height:        General: Pt is alert, awake, not in acute distress Cardiovascular: RRR, S1/S2 +, no rubs, no gallops Respiratory: CTA bilaterally, no wheezing, no rhonchi Abdominal: Soft, NT, ND, bowel sounds + Extremities: no edema, no cyanosis    The results of significant diagnostics from this hospitalization (including imaging, microbiology,  ancillary and laboratory) are listed below for reference.     Microbiology: Recent Results (from the past 240 hours)  Blood culture (routine x 2)     Status: None (Preliminary result)   Collection Time: 05/12/24 12:05 PM   Specimen: BLOOD  Result Value Ref Range Status   Specimen Description   Final    BLOOD SITE NOT SPECIFIED Performed at [facility], [address] [place], [place] [address]    Special Requests   Final    BOTTLES DRAWN AEROBIC AND ANAEROBIC Blood Culture adequate volume Performed at [facility], [address] [place], [place] [address]    Culture   Final    NO GROWTH 4 DAYS Performed at [facility], [address], [place], [place] [address]    Report Status PENDING  Incomplete  Blood culture (routine x 2)     Status: None (Preliminary result)   Collection Time: 05/12/24  5:44 PM   Specimen: BLOOD RIGHT ARM  Result Value Ref Range Status   Specimen Description   Final    BLOOD RIGHT ARM Performed at [facility], [address], [place], [place] [address]    Special Requests   Final    BOTTLES DRAWN AEROBIC AND ANAEROBIC Blood Culture adequate volume Performed at [facility], [address] [place], [place] [address]    Culture   Final    NO GROWTH 4 DAYS Performed at [facility], [address], [place], [place] [address]    Report Status PENDING  Incomplete     Labs: BNP (last 3 results) No results for input(s): BNP in the last 8760 hours. Basic Metabolic Panel: Recent Labs  Lab 05/12/24 1205 05/13/24 0433 05/14/24 0357  NA 137 136 138  K 3.9 4.0 4.2  CL 102 103 105  CO2 23 25 24   GLUCOSE 73 85 86  BUN 7 7 7   CREATININE 0.96 1.13 1.22  CALCIUM 9.9 9.1 9.2   Liver Function Tests: Recent Labs  Lab 05/12/24 1205 05/14/24 0357  AST 21 12*  ALT 27 16  ALKPHOS 115 70  BILITOT 0.4 0.8  PROT 7.9 6.8  ALBUMIN 4.8 3.4*   Recent Labs  Lab 05/12/24 1205  LIPASE 30    No results for input(s): AMMONIA in the last 168 hours. CBC: Recent Labs  Lab 05/12/24 1205 05/13/24 0433 05/14/24 0357  WBC 18.2* 14.4* 11.9*  NEUTROABS 11.8*  --   --   HGB 15.6 13.5 13.4  HCT 44.6 39.3 41.0  MCV 82.9 85.4 86.5  PLT 388 283 295   Cardiac Enzymes: No results for input(s): CKTOTAL, CKMB, CKMBINDEX, TROPONINI in the last 168 hours. BNP: Invalid input(s): POCBNP CBG: No results for input(s): GLUCAP in the last 168 hours. D-Dimer No results for input(s): DDIMER in the last 72 hours. Hgb A1c No results for input(s): HGBA1C in the last 72 hours. Lipid Profile No results  for input(s): CHOL, HDL, LDLCALC, TRIG, CHOLHDL, LDLDIRECT in the last 72 hours. Thyroid  function studies No results for input(s): TSH, T4TOTAL, T3FREE, THYROIDAB in the last 72 hours.  Invalid input(s): FREET3 Anemia work up No results for input(s): VITAMINB12, FOLATE, FERRITIN, TIBC, IRON, RETICCTPCT in the last 72 hours. Urinalysis    Component Value Date/Time   COLORURINE STRAW (A) 05/12/2024 1205   APPEARANCEUR CLEAR 05/12/2024 1205   LABSPEC <=1.005 05/12/2024 1205   PHURINE 6.0 05/12/2024 1205   GLUCOSEU NEGATIVE 05/12/2024 1205   HGBUR TRACE (A) 05/12/2024 1205   BILIRUBINUR NEGATIVE 05/12/2024 1205   KETONESUR NEGATIVE 05/12/2024 1205   PROTEINUR NEGATIVE 05/12/2024 1205   UROBILINOGEN 0.2 12/09/2013 0330   NITRITE NEGATIVE 05/12/2024 1205   LEUKOCYTESUR NEGATIVE 05/12/2024 1205   Sepsis Labs Recent Labs  Lab 05/12/24 1205 05/13/24 0433 05/14/24 0357  WBC 18.2* 14.4* 11.9*   Microbiology Recent Results (from the past 240 hours)  Blood culture (routine x 2)     Status: None (Preliminary result)   Collection Time: 05/12/24 12:05 PM   Specimen: BLOOD  Result Value Ref Range Status   Specimen Description   Final    BLOOD SITE NOT SPECIFIED Performed at [facility], [address] [place], [place]  [address]    Special Requests   Final    BOTTLES DRAWN AEROBIC AND ANAEROBIC Blood Culture adequate volume Performed at [facility], [address] [place], [place] [address]    Culture   Final    NO GROWTH 4 DAYS Performed at [facility], [address], [place], [place] [address]    Report Status PENDING  Incomplete  Blood culture (routine x 2)     Status: None (Preliminary result)   Collection Time: 05/12/24  5:44 PM   Specimen: BLOOD RIGHT ARM  Result Value Ref Range Status   Specimen Description   Final    BLOOD RIGHT ARM Performed at [facility], [address] [place], [place] [address]    Special Requests   Final    BOTTLES DRAWN AEROBIC AND ANAEROBIC Blood Culture adequate volume Performed at [facility], [address], [place], [place] [address]    Culture   Final    NO GROWTH 4 DAYS Performed at [facility], [address], [place], [place] [address]    Report Status PENDING  Incomplete     Time coordinating discharge: 50 MIN  SIGNED:   Almarie KANDICE Hoots, MD  [facility] 05/16/2024, 4:51 PM

## 2024-05-16 NOTE — Progress Notes (Signed)
 Patient still sleeping soundly at this time, no acute distress noted, respirations easy and unlabored.

## 2024-05-17 LAB — CULTURE, BLOOD (ROUTINE X 2)
Culture: NO GROWTH
Culture: NO GROWTH
Special Requests: ADEQUATE
Special Requests: ADEQUATE

## 2024-05-21 DIAGNOSIS — M352 Behcet's disease: Secondary | ICD-10-CM | POA: Diagnosis not present

## 2024-05-21 DIAGNOSIS — R569 Unspecified convulsions: Secondary | ICD-10-CM | POA: Diagnosis not present

## 2024-05-21 DIAGNOSIS — G96198 Other disorders of meninges, not elsewhere classified: Secondary | ICD-10-CM | POA: Diagnosis not present

## 2024-05-21 NOTE — Progress Notes (Signed)
 [facility]     Patient ID: Nathan Daugherty is a 33 y.o. 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  His first rituximab  infusion was back in October, 2024.  Since his last visit he was admitted to the hospital with diverticulitis and ongoing GI issues.  These are longstanding but were causing increased difficulties recently.  He was just discharged the other day and he is still fatigued but recovering.  From a neurologic standpoint he states he has been doing relatively well and tolerating the rituximab  well.  Denies any new symptoms.  Autoimmune disease history Diagnosed: 07/2022, based on clinical history, MRI findings of leptomeningeal enhancement, and CSF lymphocytic pleocytosis with elevated protein. Serum AE panels have been negative Treatments: IV solumedrol 1g daily for 3 days (10/23) followed by several months oral prednisone  with resolution of leptomeningeal enhancement on MRI.  Continued to have episodes of confusion along with mood/behavioral changes following steroids so decision was made to repeat LP which demonstrated slightly elevated lymphocyte count.  Was started on rituximab  in October, 2024 Symptoms:  - migraine headaches - possible seizures with postictal confusion (episodes of agitation, unable to recall family members' names) - personality changes   Current Medications:   Current Outpatient Medications  Medication Sig Dispense Refill  . gabapentin  (NEURONTIN ) 300 MG capsule Take 1 capsule (300 mg total) by mouth at bedtime For pain, seizure, headache 30 capsule 1  . ondansetron  (ZOFRAN -ODT) 4 MG disintegrating tablet Take 4 mg by mouth every 8 (eight) hours as needed    . riTUXimab  (RITUXAN ) 10 mg/mL injection Inject into the vein Q 4 months  Last treatment April 2025    . clindamycin (CLEOCIN) 300 MG capsule Take 300 mg by mouth 3 (three) times daily (Patient not taking: Reported on  05/21/2024)     No current facility-administered medications for this visit.    Allergies:   Allergies  Allergen Reactions  . Adhesive Dermatitis    Natural red hair   . Ketorolac  Tromethamine  Photosensitivity    Migraines  . Tramadol Unknown and Photosensitivity    AMS, Migraines    Neurologic exam:  No exam performed today since this was a video visit.   Data:   Imaging  MRI Brain with and without contrast 04/30/2024 No abnormal enhancement  Routine EEG 07/11/2022 Normal  UDS negative on 104/23 UDS showed benzos on 07/06/22 which he received from EMS  Overall Assessment:   Over the past couple years we did an extensive evaluation for autoimmune disease for Nathan Daugherty.  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  Acute meningeal syndrome can cause confusion and seizures along with psychiatric symptoms.  He has had both oral and genital lesions which might be consistent with Behcet's.   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  We will continue this for the time being.  Will continue to monitor for any breakthrough neurologic symptoms.   This video encounter was conducted with the patient's (or proxy's) verbal consent via secure, interactive audio and video telecommunications while in clinic/office/hospital.  The patient (or proxy) was instructed to have this encounter in a suitably private space and to only have persons present to whom they give permission to participate. In addition, patient identity was confirmed by use of name plus an  additional identifier.  This visit was coded based on medical decision making (MDM).   Attestation Statement:   I personally performed the service. (TP)  Lonni Deward Pole, MD

## 2024-07-15 ENCOUNTER — Emergency Department (HOSPITAL_BASED_OUTPATIENT_CLINIC_OR_DEPARTMENT_OTHER)

## 2024-07-15 ENCOUNTER — Emergency Department (HOSPITAL_BASED_OUTPATIENT_CLINIC_OR_DEPARTMENT_OTHER)
Admission: EM | Admit: 2024-07-15 | Discharge: 2024-07-15 | Disposition: A | Discharge status: Home / Self Care | Attending: Emergency Medicine | Admitting: Emergency Medicine

## 2024-07-15 ENCOUNTER — Encounter (HOSPITAL_BASED_OUTPATIENT_CLINIC_OR_DEPARTMENT_OTHER): Payer: Self-pay

## 2024-07-15 ENCOUNTER — Other Ambulatory Visit: Payer: Self-pay

## 2024-07-15 DIAGNOSIS — B9789 Other viral agents as the cause of diseases classified elsewhere: Secondary | ICD-10-CM | POA: Insufficient documentation

## 2024-07-15 DIAGNOSIS — J988 Other specified respiratory disorders: Secondary | ICD-10-CM | POA: Insufficient documentation

## 2024-07-15 DIAGNOSIS — D72829 Elevated white blood cell count, unspecified: Secondary | ICD-10-CM | POA: Diagnosis not present

## 2024-07-15 DIAGNOSIS — K920 Hematemesis: Secondary | ICD-10-CM | POA: Diagnosis not present

## 2024-07-15 DIAGNOSIS — R059 Cough, unspecified: Secondary | ICD-10-CM | POA: Diagnosis not present

## 2024-07-15 DIAGNOSIS — R000 Tachycardia, unspecified: Secondary | ICD-10-CM | POA: Diagnosis not present

## 2024-07-15 DIAGNOSIS — R0602 Shortness of breath: Secondary | ICD-10-CM | POA: Diagnosis not present

## 2024-07-15 DIAGNOSIS — J069 Acute upper respiratory infection, unspecified: Secondary | ICD-10-CM | POA: Diagnosis not present

## 2024-07-15 DIAGNOSIS — R9431 Abnormal electrocardiogram [ECG] [EKG]: Secondary | ICD-10-CM | POA: Diagnosis not present

## 2024-07-15 DIAGNOSIS — R918 Other nonspecific abnormal finding of lung field: Secondary | ICD-10-CM | POA: Diagnosis not present

## 2024-07-15 DIAGNOSIS — R509 Fever, unspecified: Secondary | ICD-10-CM | POA: Diagnosis present

## 2024-07-15 HISTORY — DX: Behcet's disease: M35.2

## 2024-07-15 LAB — COMPREHENSIVE METABOLIC PANEL WITH GFR
ALT: 25 U/L (ref 0–44)
AST: 30 U/L (ref 15–41)
Albumin: 4.7 g/dL (ref 3.5–5.0)
Alkaline Phosphatase: 83 U/L (ref 38–126)
Anion gap: 16 — ABNORMAL HIGH (ref 5–15)
BUN: 8 mg/dL (ref 6–20)
CO2: 19 mmol/L — ABNORMAL LOW (ref 22–32)
Calcium: 9.6 mg/dL (ref 8.9–10.3)
Chloride: 101 mmol/L (ref 98–111)
Creatinine, Ser: 1.07 mg/dL (ref 0.61–1.24)
GFR, Estimated: >60 mL/min (ref >60)
Glucose, Bld: 86 mg/dL (ref 70–99)
Potassium: 4.2 mmol/L (ref 3.5–5.1)
Sodium: 136 mmol/L (ref 135–145)
Total Bilirubin: 0.4 mg/dL (ref 0.0–1.2)
Total Protein: 7.7 g/dL (ref 6.5–8.1)

## 2024-07-15 LAB — URINALYSIS, MICROSCOPIC (REFLEX)

## 2024-07-15 LAB — URINALYSIS, ROUTINE W REFLEX MICROSCOPIC
Bilirubin Urine: NEGATIVE
Glucose, UA: >=500 mg/dL — AB
Hgb urine dipstick: NEGATIVE
Ketones, ur: NEGATIVE mg/dL
Leukocytes,Ua: NEGATIVE
Nitrite: NEGATIVE
Protein, ur: 100 mg/dL — AB
Specific Gravity, Urine: 1.02 (ref 1.005–1.030)
pH: 8.5 — ABNORMAL HIGH (ref 5.0–8.0)

## 2024-07-15 LAB — CBC WITH DIFFERENTIAL/PLATELET
Abs Immature Granulocytes: 0.06 K/uL (ref 0.00–0.07)
Basophils Absolute: 0.1 K/uL (ref 0.0–0.1)
Basophils Relative: 0 %
Eosinophils Absolute: 0.3 K/uL (ref 0.0–0.5)
Eosinophils Relative: 2 %
HCT: 45.7 % (ref 39.0–52.0)
Hemoglobin: 16.1 g/dL (ref 13.0–17.0)
Immature Granulocytes: 0 %
Lymphocytes Relative: 16 %
Lymphs Abs: 2.8 K/uL (ref 0.7–4.0)
MCH: 29.5 pg (ref 26.0–34.0)
MCHC: 35.2 g/dL (ref 30.0–36.0)
MCV: 83.7 fL (ref 80.0–100.0)
Monocytes Absolute: 2 K/uL — ABNORMAL HIGH (ref 0.1–1.0)
Monocytes Relative: 11 %
Neutro Abs: 12.6 K/uL — ABNORMAL HIGH (ref 1.7–7.7)
Neutrophils Relative %: 71 %
Platelets: 305 K/uL (ref 150–400)
RBC: 5.46 MIL/uL (ref 4.22–5.81)
RDW: 12.7 % (ref 11.5–15.5)
Smear Review: NORMAL
WBC: 17.7 K/uL — ABNORMAL HIGH (ref 4.0–10.5)
nRBC: 0 % (ref 0.0–0.2)

## 2024-07-15 LAB — LACTIC ACID, PLASMA: Lactic Acid, Venous: 0.9 mmol/L (ref 0.5–1.9)

## 2024-07-15 LAB — LIPASE, BLOOD: Lipase: 23 U/L (ref 11–51)

## 2024-07-15 LAB — RESP PANEL BY RT-PCR (RSV, FLU A&B, COVID)  RVPGX2
Influenza A by PCR: NEGATIVE
Influenza B by PCR: NEGATIVE
Resp Syncytial Virus by PCR: NEGATIVE
SARS Coronavirus 2 by RT PCR: NEGATIVE

## 2024-07-15 MED ORDER — LACTATED RINGERS IV BOLUS
1000.0000 mL | Freq: Once | INTRAVENOUS | Status: AC
  Administered 2024-07-15: 1000 mL via INTRAVENOUS

## 2024-07-15 MED ORDER — LACTATED RINGERS IV BOLUS
1000.0000 mL | Freq: Once | INTRAVENOUS | Status: AC
Start: 2024-07-15 — End: 2024-07-15
  Administered 2024-07-15: 1000 mL via INTRAVENOUS

## 2024-07-15 MED ORDER — IOHEXOL 300 MG/ML  SOLN
100.0000 mL | Freq: Once | INTRAMUSCULAR | Status: AC | PRN
  Administered 2024-07-15: 100 mL via INTRAVENOUS

## 2024-07-15 MED ORDER — DICYCLOMINE HCL 20 MG PO TABS: 20.0000 mg | ORAL_TABLET | Freq: Three times a day (TID) | ORAL | 0 refills | Status: AC

## 2024-07-15 MED ORDER — HYDROMORPHONE HCL 1 MG/ML IJ SOLN
1.0000 mg | Freq: Once | INTRAMUSCULAR | Status: AC
  Administered 2024-07-15: 1 mg via INTRAVENOUS
  Filled 2024-07-15: qty 1

## 2024-07-15 MED ORDER — HYDROMORPHONE HCL 1 MG/ML IJ SOLN
0.5000 mg | Freq: Once | INTRAMUSCULAR | Status: AC
  Administered 2024-07-15: 0.5 mg via INTRAVENOUS
  Filled 2024-07-15: qty 1

## 2024-07-15 MED ORDER — ONDANSETRON HCL 4 MG/2ML IJ SOLN
4.0000 mg | Freq: Once | INTRAMUSCULAR | Status: AC
  Administered 2024-07-15: 4 mg via INTRAVENOUS
  Filled 2024-07-15: qty 2

## 2024-07-15 MED ORDER — METOCLOPRAMIDE HCL 10 MG PO TABS: 10.0000 mg | ORAL_TABLET | Freq: Three times a day (TID) | ORAL | 0 refills | Status: AC | PRN

## 2024-07-15 MED ORDER — ACETAMINOPHEN 500 MG PO TABS
1000.0000 mg | ORAL_TABLET | Freq: Once | ORAL | Status: AC
  Administered 2024-07-15: 1000 mg via ORAL
  Filled 2024-07-15: qty 2

## 2024-07-15 NOTE — ED Provider Notes (Signed)
 [facility] Provider Note   CSN: [id number] Arrival date & time: 07/15/24  9047     Patient presents with: flu-like symptoms   Nathan Daugherty is a 34 y.o. male.   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  Has been having symptoms overall for about 3 days.  Temperature has been up to 102.  This morning he developed multiple episodes of vomiting, at least 5.  After the first couple episodes, which he states were violent he started having blood in his emesis.  He has not had any diarrhea or blood in the stools.  He took ibuprofen  about an hour prior to arrival.  He is having abdominal pain, mostly right-sided, as well as a cough and some dyspnea.  Prior to Admission medications   Medication Sig Start Date End Date Taking? Authorizing Provider  dicyclomine  (BENTYL ) 20 MG tablet Take 1 tablet (20 mg total) by mouth 3 (three) times daily before meals. 07/15/24  Yes Freddi Hamilton, MD  metoCLOPramide  (REGLAN ) 10 MG tablet Take 1 tablet (10 mg total) by mouth every 8 (eight) hours as needed for nausea. 07/15/24  Yes Freddi Hamilton, MD  acetaminophen  (TYLENOL ) 325 MG tablet Take 2 tablets (650 mg total) by mouth every 6 (six) hours as needed for mild pain (pain score 1-3) or fever (or Fever >/= 101). 05/16/24   Will Almarie MATSU, MD  bisacodyl  (DULCOLAX) 5 MG EC tablet Take 1 tablet (5 mg total) by mouth daily as needed for moderate constipation. 05/16/24   Will Almarie MATSU, MD  gabapentin  (NEURONTIN ) 300 MG capsule Take 300 mg by mouth at bedtime. 04/30/24   [provider]  ibuprofen  (ADVIL ) 200 MG tablet Take 800 mg by mouth daily as needed for mild pain (pain score 1-3) or moderate pain (pain score 4-6).    [provider]  ondansetron  (ZOFRAN ) 4 MG tablet Take 1 tablet (4 mg total) by mouth every 6 (six) hours as needed for nausea.  05/16/24   Will Almarie MATSU, MD  oxyCODONE  (ROXICODONE ) 5 MG immediate release tablet Take 1 tablet (5 mg total) by mouth every 8 (eight) hours as needed. 05/16/24 05/16/25  Will Almarie MATSU, MD  riTUXimab  (RITUXAN ) 500 MG/50ML injection Inject into the vein See admin instructions. Every 4 months    [provider]  senna-docusate (SENOKOT-S) 8.6-50 MG tablet Take 1 tablet by mouth at bedtime as needed for mild constipation. 05/16/24   Will Almarie MATSU, MD    Allergies: Toradol  [ketorolac  tromethamine ] and Tramadol    Review of Systems  Constitutional:  Positive for chills and fever.  HENT:  Negative for sore throat.   Respiratory:  Positive for cough and shortness of breath.   Gastrointestinal:  Positive for abdominal pain, nausea and vomiting.  Musculoskeletal:  Positive for myalgias.    Updated Vital Signs BP 122/84   Pulse 83   Temp 99.5 F (37.5 C) (Oral)   Resp 13   Ht 5' 11 (1.803 m)   Wt 86.2 kg   SpO2 99%   BMI 26.50 kg/m   Physical Exam Vitals and nursing note reviewed.  Constitutional:      Appearance: He is well-developed.  HENT:     Head: Normocephalic and atraumatic.  Cardiovascular:     Rate and Rhythm: Regular rhythm. Tachycardia present.     Heart sounds: Normal heart sounds.  Pulmonary:  Effort: Pulmonary effort is normal.     Breath sounds: Normal breath sounds. No wheezing, rhonchi or rales.  Abdominal:     Palpations: Abdomen is soft.     Tenderness: There is abdominal tenderness (generalized, worse on the right).  Skin:    General: Skin is warm and dry.  Neurological:     Mental Status: He is alert.     (all labs ordered are listed, but only abnormal results are displayed) Labs Reviewed  CBC WITH DIFFERENTIAL/PLATELET - Abnormal; Notable for the following components:      Result Value   WBC 17.7 (*)    Neutro Abs 12.6 (*)    Monocytes Absolute 2.0 (*)    All other components within normal limits  COMPREHENSIVE  METABOLIC PANEL WITH GFR - Abnormal; Notable for the following components:   CO2 19 (*)    Anion gap 16 (*)    All other components within normal limits  URINALYSIS, ROUTINE W REFLEX MICROSCOPIC - Abnormal; Notable for the following components:   APPearance CLOUDY (*)    pH 8.5 (*)    Glucose, UA >=500 (*)    Protein, ur 100 (*)    All other components within normal limits  URINALYSIS, MICROSCOPIC (REFLEX) - Abnormal; Notable for the following components:   Bacteria, UA FEW (*)    All other components within normal limits  RESP PANEL BY RT-PCR (RSV, FLU A&B, COVID)  RVPGX2  LIPASE, BLOOD  LACTIC ACID, PLASMA    EKG: EKG Interpretation Date/Time:  Thursday July 15 2024 10:32:36 EDT Ventricular Rate:  97 PR Interval:  160 QRS Duration:  95 QT Interval:  320 QTC Calculation: 407 R Axis:   63  Text Interpretation: Sinus rhythm ST elev, probable normal early repol pattern similar to April 2024 Confirmed by Freddi Hamilton [phone]) on 07/15/2024 10:36:19 AM  Radiology: CT ABDOMEN PELVIS W CONTRAST Result Date: 07/15/2024 CLINICAL DATA:  Hematemesis. EXAM: CT ABDOMEN AND PELVIS WITH CONTRAST TECHNIQUE: Multidetector CT imaging of the abdomen and pelvis was performed using the standard protocol following bolus administration of intravenous contrast. RADIATION DOSE REDUCTION: This exam was performed according to the departmental dose-optimization program which includes automated exposure control, adjustment of the mA and/or kV according to patient size and/or use of iterative reconstruction technique. CONTRAST:  OMNIPAQUE  IOHEXOL  300 MG/ML  SOLN COMPARISON:  May 12, 2024. FINDINGS: Lower chest: No acute abnormality. Hepatobiliary: No focal liver abnormality is seen. No gallstones, gallbladder wall thickening, or biliary dilatation. Pancreas: Unremarkable. No pancreatic ductal dilatation or surrounding inflammatory changes. Spleen: Normal in size without focal abnormality.  Adrenals/Urinary Tract: Adrenal glands are unremarkable. Kidneys are normal, without renal calculi, focal lesion, or hydronephrosis. Bladder is unremarkable. Stomach/Bowel: Stomach is within normal limits. Appendix appears normal. No evidence of bowel wall thickening, distention, or inflammatory changes. Vascular/Lymphatic: No significant vascular findings are present. No enlarged abdominal or pelvic lymph nodes. Reproductive: Prostate is unremarkable. Other: No abdominal wall hernia or abnormality. No abdominopelvic ascites. Musculoskeletal: No acute or significant osseous findings. IMPRESSION: No definite abnormality seen in the abdomen or pelvis. Electronically Signed   By: Lynwood Landy Raddle M.D.   On: 07/15/2024 13:13   DG Chest 2 View Result Date: 07/15/2024 CLINICAL DATA:  Cough, fever EXAM: CHEST - 2 VIEW COMPARISON:  May 23, 2023 FINDINGS: Prominent central perihilar opacities bilaterally. No pleural effusions. No pneumothorax. Cardiomediastinal silhouette is within normal limits. No acute osseous findings. IMPRESSION: Nonspecific prominent perihilar opacities bilaterally, may represent a viral infection  and/or bronchitis. Electronically Signed   By: Michaeline Blanch M.D.   On: 07/15/2024 11:14     Procedures   Medications Ordered in the ED  acetaminophen  (TYLENOL ) tablet 1,000 mg (1,000 mg Oral Given 07/15/24 1050)  lactated ringers  bolus 1,000 mL (0 mLs Intravenous Stopped 07/15/24 1152)  ondansetron  (ZOFRAN ) injection 4 mg (4 mg Intravenous Given 07/15/24 1051)  HYDROmorphone  (DILAUDID ) injection 1 mg (1 mg Intravenous Given 07/15/24 1052)  lactated ringers  bolus 1,000 mL (0 mLs Intravenous Stopped 07/15/24 1352)  HYDROmorphone  (DILAUDID ) injection 1 mg (1 mg Intravenous Given 07/15/24 1203)  iohexol  (OMNIPAQUE ) 300 MG/ML solution 100 mL (100 mLs Intravenous Contrast Given 07/15/24 1212)  HYDROmorphone  (DILAUDID ) injection 0.5 mg (0.5 mg Intravenous Given 07/15/24 1323)                                     Medical Decision Making Amount and/or Complexity of Data Reviewed Labs: ordered.    Details: Leukocytosis.  Mild anion gap acidosis Radiology: ordered and independent interpretation performed.    Details: No lobar pneumonia or diverticulitis or appendicitis. ECG/medicine tests: ordered and independent interpretation performed.    Details: Sinus rhythm  Risk OTC drugs. Prescription drug management.   Patient presents with fever and cough.  He is also having abdominal pain.  From chart review and patient report the abdominal pain tends to come on whenever he has any type of illness.  Because of his Behcet's he states that all of his viral illnesses such as colds ended becoming much more severe.  He has required multiple doses of pain medicine for his abdominal pain but his CT is unremarkable.  He has a leukocytosis but also vomited numerous times this morning and so this could be a stress response.  At this point there is no sign of an obvious bacterial infection.  I discussed with his immunosuppression, we can consider antibiotics but he declines.  He feels like it is still a virus which is most likely the case.  Seems like he often has a leukocytosis.  At this point, he is symptomatically feeling better.  He has an anion gap acidosis but a normal lactate and I suspect this is from dehydration.  Vital signs are now normal after treatment with fluids.  At this point, I think it is reasonable to let him go home and will provide Reglan  and Bentyl  as he states these have helped his abdominal pain in the past.  Otherwise, will give return precautions but he appears stable for discharge.     Final diagnoses:  Viral respiratory infection    ED Discharge Orders          Ordered    metoCLOPramide  (REGLAN ) 10 MG tablet  Every 8 hours PRN        07/15/24 1343    dicyclomine  (BENTYL ) 20 MG tablet  3 times daily before meals        07/15/24 1343               Freddi Hamilton,  MD 07/15/24 1412

## 2024-07-15 NOTE — Discharge Instructions (Addendum)
 Return to the ER if you develop new or worsening abdominal pain, vomiting, the fever does not go away in the next 24-48 hours, or any other new/concerning symptoms.

## 2024-07-15 NOTE — ED Triage Notes (Addendum)
 Pt states that he has been feeling sick the last few days. States that he has been having cough, fever and body aches. Vomited x 1. States that he was vomiting blood.States that he has diverticulitis and Behcets dx.   Pt took ibuprofen  at 0900

## 2024-07-15 NOTE — ED Notes (Signed)
Pt provided a warm blanket 

## 2024-07-23 DIAGNOSIS — Z23 Encounter for immunization: Secondary | ICD-10-CM | POA: Diagnosis not present

## 2024-07-23 DIAGNOSIS — M352 Behcet's disease: Secondary | ICD-10-CM | POA: Diagnosis not present

## 2024-07-31 DIAGNOSIS — J392 Other diseases of pharynx: Secondary | ICD-10-CM | POA: Diagnosis not present

## 2024-07-31 DIAGNOSIS — G0481 Other encephalitis and encephalomyelitis: Secondary | ICD-10-CM | POA: Diagnosis not present

## 2024-07-31 DIAGNOSIS — T451X5A Adverse effect of antineoplastic and immunosuppressive drugs, initial encounter: Secondary | ICD-10-CM | POA: Diagnosis not present

## 2024-07-31 DIAGNOSIS — H938X9 Other specified disorders of ear, unspecified ear: Secondary | ICD-10-CM | POA: Diagnosis not present

## 2024-09-03 DIAGNOSIS — K649 Unspecified hemorrhoids: Secondary | ICD-10-CM | POA: Diagnosis not present

## 2024-09-25 ENCOUNTER — Emergency Department (HOSPITAL_BASED_OUTPATIENT_CLINIC_OR_DEPARTMENT_OTHER)

## 2024-09-25 ENCOUNTER — Encounter (HOSPITAL_BASED_OUTPATIENT_CLINIC_OR_DEPARTMENT_OTHER): Payer: Self-pay | Admitting: Emergency Medicine

## 2024-09-25 ENCOUNTER — Emergency Department (HOSPITAL_BASED_OUTPATIENT_CLINIC_OR_DEPARTMENT_OTHER)
Admission: EM | Admit: 2024-09-25 | Discharge: 2024-09-26 | Disposition: A | Discharge status: Home / Self Care | Attending: Emergency Medicine | Admitting: Emergency Medicine

## 2024-09-25 ENCOUNTER — Other Ambulatory Visit: Payer: Self-pay

## 2024-09-25 DIAGNOSIS — J101 Influenza due to other identified influenza virus with other respiratory manifestations: Secondary | ICD-10-CM | POA: Diagnosis not present

## 2024-09-25 DIAGNOSIS — Z87891 Personal history of nicotine dependence: Secondary | ICD-10-CM | POA: Insufficient documentation

## 2024-09-25 DIAGNOSIS — R109 Unspecified abdominal pain: Secondary | ICD-10-CM

## 2024-09-25 DIAGNOSIS — R1012 Left upper quadrant pain: Secondary | ICD-10-CM | POA: Insufficient documentation

## 2024-09-25 DIAGNOSIS — R112 Nausea with vomiting, unspecified: Secondary | ICD-10-CM | POA: Diagnosis present

## 2024-09-25 LAB — COMPREHENSIVE METABOLIC PANEL WITH GFR
ALT: 27 U/L (ref 0–44)
AST: 24 U/L (ref 15–41)
Albumin: 4.5 g/dL (ref 3.5–5.0)
Alkaline Phosphatase: 90 U/L (ref 38–126)
Anion gap: 12 (ref 5–15)
BUN: 11 mg/dL (ref 6–20)
CO2: 24 mmol/L (ref 22–32)
Calcium: 9.2 mg/dL (ref 8.9–10.3)
Chloride: 103 mmol/L (ref 98–111)
Creatinine, Ser: 1.1 mg/dL (ref 0.61–1.24)
GFR, Estimated: >60 mL/min
Glucose, Bld: 103 mg/dL — ABNORMAL HIGH (ref 70–99)
Potassium: 3.8 mmol/L (ref 3.5–5.1)
Sodium: 139 mmol/L (ref 135–145)
Total Bilirubin: 0.3 mg/dL (ref 0.0–1.2)
Total Protein: 6.8 g/dL (ref 6.5–8.1)

## 2024-09-25 LAB — URINALYSIS, MICROSCOPIC (REFLEX): WBC, UA: NONE SEEN WBC/hpf (ref 0–5)

## 2024-09-25 LAB — URINALYSIS, ROUTINE W REFLEX MICROSCOPIC
Bilirubin Urine: NEGATIVE
Glucose, UA: NEGATIVE mg/dL
Ketones, ur: NEGATIVE mg/dL
Leukocytes,Ua: NEGATIVE
Nitrite: NEGATIVE
Protein, ur: NEGATIVE mg/dL
Specific Gravity, Urine: 1.02 (ref 1.005–1.030)
pH: 6.5 (ref 5.0–8.0)

## 2024-09-25 LAB — CBC WITH DIFFERENTIAL/PLATELET
Abs Immature Granulocytes: 0.04 K/uL (ref 0.00–0.07)
Basophils Absolute: 0 K/uL (ref 0.0–0.1)
Basophils Relative: 0 %
Eosinophils Absolute: 0.2 K/uL (ref 0.0–0.5)
Eosinophils Relative: 1 %
HCT: 42.5 % (ref 39.0–52.0)
Hemoglobin: 14.8 g/dL (ref 13.0–17.0)
Immature Granulocytes: 0 %
Lymphocytes Relative: 19 %
Lymphs Abs: 2.2 K/uL (ref 0.7–4.0)
MCH: 28.7 pg (ref 26.0–34.0)
MCHC: 34.8 g/dL (ref 30.0–36.0)
MCV: 82.4 fL (ref 80.0–100.0)
Monocytes Absolute: 1.7 K/uL — ABNORMAL HIGH (ref 0.1–1.0)
Monocytes Relative: 15 %
Neutro Abs: 7.4 K/uL (ref 1.7–7.7)
Neutrophils Relative %: 65 %
Platelets: 293 K/uL (ref 150–400)
RBC: 5.16 MIL/uL (ref 4.22–5.81)
RDW: 12.8 % (ref 11.5–15.5)
WBC: 11.5 K/uL — ABNORMAL HIGH (ref 4.0–10.5)
nRBC: 0 % (ref 0.0–0.2)

## 2024-09-25 LAB — LIPASE, BLOOD: Lipase: 27 U/L (ref 11–51)

## 2024-09-25 LAB — RESP PANEL BY RT-PCR (RSV, FLU A&B, COVID)  RVPGX2
Influenza A by PCR: POSITIVE — AB
Influenza B by PCR: NEGATIVE
Resp Syncytial Virus by PCR: NEGATIVE
SARS Coronavirus 2 by RT PCR: NEGATIVE

## 2024-09-25 MED ORDER — ONDANSETRON HCL 4 MG/2ML IJ SOLN: 4.0000 mg | Freq: Once | INTRAMUSCULAR | Status: DC | PRN

## 2024-09-25 MED ORDER — MORPHINE SULFATE (PF) 4 MG/ML IV SOLN
4.0000 mg | Freq: Once | INTRAVENOUS | Status: AC
  Administered 2024-09-25: 4 mg via INTRAVENOUS
  Filled 2024-09-25: qty 1

## 2024-09-25 MED ORDER — HYDROMORPHONE HCL 1 MG/ML IJ SOLN
0.5000 mg | Freq: Once | INTRAMUSCULAR | Status: AC
  Administered 2024-09-25: 0.5 mg via INTRAVENOUS
  Filled 2024-09-25: qty 1

## 2024-09-25 MED ORDER — HYDROMORPHONE HCL 1 MG/ML IJ SOLN
0.5000 mg | Freq: Once | INTRAMUSCULAR | Status: AC
  Administered 2024-09-26: 0.5 mg via INTRAVENOUS
  Filled 2024-09-25: qty 1

## 2024-09-25 MED ORDER — IOHEXOL 300 MG/ML  SOLN
100.0000 mL | Freq: Once | INTRAMUSCULAR | Status: AC | PRN
  Administered 2024-09-25: 100 mL via INTRAVENOUS

## 2024-09-25 MED ORDER — OXYCODONE HCL 5 MG PO TABS: 5.0000 mg | ORAL_TABLET | ORAL | 0 refills | Status: AC | PRN

## 2024-09-25 MED ORDER — ONDANSETRON HCL 4 MG PO TABS: 4.0000 mg | ORAL_TABLET | Freq: Three times a day (TID) | ORAL | 0 refills | Status: AC | PRN

## 2024-09-25 MED ORDER — ONDANSETRON HCL 4 MG/2ML IJ SOLN
4.0000 mg | Freq: Once | INTRAMUSCULAR | Status: AC
  Administered 2024-09-25: 4 mg via INTRAVENOUS
  Filled 2024-09-25: qty 2

## 2024-09-25 MED ORDER — OXYCODONE HCL 5 MG PO TABS
5.0000 mg | ORAL_TABLET | Freq: Once | ORAL | Status: AC
  Administered 2024-09-25: 5 mg via ORAL
  Filled 2024-09-25: qty 1

## 2024-09-25 MED ORDER — SODIUM CHLORIDE 0.9 % IV BOLUS
1000.0000 mL | Freq: Once | INTRAVENOUS | Status: AC
  Administered 2024-09-25: 1000 mL via INTRAVENOUS

## 2024-09-25 MED ORDER — DICYCLOMINE HCL 10 MG PO CAPS
10.0000 mg | ORAL_CAPSULE | Freq: Once | ORAL | Status: DC
  Filled 2024-09-25: qty 1

## 2024-09-25 NOTE — ED Provider Notes (Signed)
 " [place] [facility] Provider Note  CSN: [id number] Arrival date & time: 09/25/24 8076  Chief Complaint(s) Abdominal Pain  HPI Nathan ELIZONDO is a 33 y.o. male with past medical history as below, significant for Behcet's disease, IBD, diverticulitis, depression who presents to the ED with complaint of abdominal pain, body aches, nausea vomiting  Multiple family ember's recently diagnosed with the flu.  Patient began having vague abdominal pain and bodyaches over the past 24 hours, seems to worsen to this afternoon.  Nausea and vomiting this morning but has since improved.  No change in bowel or bladder function.  Did have a fever prior to arrival at home reportedly 101, took antipyretic prior to arrival.  No chest pain or dyspnea.  No rashes.  No recent travel.  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 Authorizing Provider  ondansetron  (ZOFRAN ) 4 MG tablet Take 1 tablet (4 mg total) by mouth every 8 (eight) hours as needed. 09/25/24  Yes Elnor Savant A, DO  oxyCODONE  (ROXICODONE ) 5 MG immediate  release tablet Take 1 tablet (5 mg total) by mouth every 4 (four) hours as needed. 09/25/24  Yes Elnor Savant A, DO  acetaminophen  (TYLENOL ) 325 MG tablet Take 2 tablets (650 mg total) by mouth every 6 (six) hours as needed for mild pain (pain score 1-3) or fever (or Fever >/= 101). 05/16/24   Will Almarie MATSU, MD  bisacodyl  (DULCOLAX) 5 MG EC tablet Take 1 tablet (5 mg total) by mouth daily as needed for moderate constipation. 05/16/24   Will Almarie MATSU, MD  dicyclomine  (BENTYL ) 20 MG tablet Take 1 tablet (20 mg total) by mouth 3 (three) times daily before meals. 07/15/24   Freddi Hamilton, MD  gabapentin  (NEURONTIN ) 300 MG capsule Take 300 mg by mouth at bedtime. 04/30/24   [provider]  ibuprofen  (ADVIL ) 200 MG tablet Take 800 mg by mouth daily as needed for mild pain (pain score 1-3) or moderate pain (pain score 4-6).    [provider]  metoCLOPramide  (REGLAN ) 10 MG tablet Take 1 tablet (10 mg total) by mouth every 8 (eight) hours as needed for nausea. 07/15/24   Freddi Hamilton, MD  oxyCODONE -acetaminophen  (PERCOCET/ROXICET) 5-325 MG tablet Take 1 tablet by mouth every 6 (six) hours as needed for severe pain (pain score 7-10). 09/27/24   Myriam Fonda RAMAN, PA-C  riTUXimab  (RITUXAN ) 500 MG/50ML injection Inject into the vein See admin instructions. Every 4 months    [provider]  senna-docusate (SENOKOT-S) 8.6-50 MG tablet Take 1 tablet by mouth at bedtime as needed for mild constipation. 05/16/24   Will Almarie MATSU, MD  Past Surgical History Past Surgical History:  Procedure Laterality Date   HYPOSPADIAS CORRECTION     Family History Family History  Problem Relation Age of Onset   Breast cancer Mother    Diabetes Mellitus II Maternal Grandmother    Breast cancer Maternal Grandmother    Lung cancer Maternal Grandfather      Social History Social History[1] Allergies Toradol  [ketorolac  tromethamine ] and Tramadol  Review of Systems A thorough review of systems was obtained and all systems are negative except as noted in the HPI and PMH.   Physical Exam Vital Signs  I have reviewed the triage vital signs BP 120/83 (BP Location: Right Arm)   Pulse 94   Temp 99.9 F (37.7 C) (Oral)   Resp (!) 22   Ht 5' 11 (1.803 m)   Wt 86.2 kg   SpO2 99%   BMI 26.50 kg/m  Physical Exam Vitals and nursing note reviewed.  Constitutional:      General: He is not in acute distress.    Appearance: Normal appearance. He is well-developed. He is not ill-appearing.  HENT:     Head: Normocephalic and atraumatic.     Right Ear: External ear normal.     Left Ear: External ear normal.     Nose: Nose normal.     Mouth/Throat:     Mouth: Mucous membranes are moist.  Eyes:     General: No scleral icterus.       Right eye: No discharge.        Left eye: No discharge.  Cardiovascular:     Rate and Rhythm: Normal rate.  Pulmonary:     Effort: Pulmonary effort is normal. No respiratory distress.     Breath sounds: No stridor.  Abdominal:     General: Abdomen is flat. There is no distension.     Tenderness: There is abdominal tenderness in the suprapubic area, left upper quadrant and left lower quadrant. There is no guarding.  Musculoskeletal:        General: No deformity.     Cervical back: No rigidity.  Skin:    General: Skin is warm and dry.     Coloration: Skin is not cyanotic, jaundiced or pale.  Neurological:     Mental Status: He is alert.  Psychiatric:        Speech: Speech normal.        Behavior: Behavior normal. Behavior is cooperative.     ED Results and Treatments Labs (all labs ordered are listed, but only abnormal results are displayed) Labs Reviewed  RESP PANEL BY RT-PCR (RSV, FLU A&B, COVID)  RVPGX2 - Abnormal; Notable for the following components:      Result Value   Influenza A by PCR  POSITIVE (*)    All other components within normal limits  COMPREHENSIVE METABOLIC PANEL WITH GFR - Abnormal; Notable for the following components:   Glucose, Bld 103 (*)    All other components within normal limits  URINALYSIS, ROUTINE W REFLEX MICROSCOPIC - Abnormal; Notable for the following components:   Hgb urine dipstick SMALL (*)    All other components within normal limits  CBC WITH DIFFERENTIAL/PLATELET - Abnormal; Notable for the following components:   WBC 11.5 (*)    Monocytes Absolute 1.7 (*)    All other components within normal limits  URINALYSIS, MICROSCOPIC (REFLEX) - Abnormal; Notable for the following components:   Bacteria, UA RARE (*)    All other components within normal limits  LIPASE, BLOOD  Radiology No results found.  Pertinent labs & imaging results that were available during my care of the patient were reviewed by me and considered in my medical decision making (see MDM for details).  Medications Ordered in ED Medications  morphine  (PF) 4 MG/ML injection 4 mg (4 mg Intravenous Given 09/25/24 2044)  ondansetron  (ZOFRAN ) injection 4 mg (4 mg Intravenous Given 09/25/24 2043)  sodium chloride  0.9 % bolus 1,000 mL (0 mLs Intravenous Stopped 09/25/24 2202)  iohexol  (OMNIPAQUE ) 300 MG/ML solution 100 mL (100 mLs Intravenous Contrast Given 09/25/24 2150)  HYDROmorphone  (DILAUDID ) injection 0.5 mg (0.5 mg Intravenous Given 09/25/24 2124)  oxyCODONE  (Oxy IR/ROXICODONE ) immediate release tablet 5 mg (5 mg Oral Given 09/25/24 2223)  HYDROmorphone  (DILAUDID ) injection 0.5 mg (0.5 mg Intravenous Given 09/26/24 0047)                                                                                                                                     Procedures Procedures  (including critical care time)  Medical Decision Making / ED  Course    Medical Decision Making:    MACALLAN ORD is a 34 y.o. male with past medical history as below, significant for Behcet's disease, IBD, diverticulitis, depression who presents to the ED with complaint of abdominal pain, body aches, nausea vomiting. The complaint involves an extensive differential diagnosis and also carries with it a high risk of complications and morbidity.  Serious etiology was considered. Ddx includes but is not limited to: Differential diagnosis includes but is not exclusive to acute appendicitis, renal colic, testicular torsion, urinary tract infection, prostatitis,  diverticulitis, small bowel obstruction, colitis, abdominal aortic aneurysm, gastroenteritis, constipation etc.   Complete initial physical exam performed, notably the patient was in no acute distress, abdomen soft.    Reviewed and confirmed nursing documentation for past medical history, family history, social history.  Vital signs reviewed.    Left lower quad abdominal pain Arthralgias Malaise N/v  > - Abdomen soft, he has TTP to left lower quadrant primarily, also suprapubic and left flank/left upper quadrant - HDS, afebrile - labs stable - imaging stable - RVP w/ flu, concern likely etiology of complaints today - feeling much better, tolerating po  Clinical Course as of 09/28/24 0023  Sat Sep 25, 2024  2122 Influenza A By PCR(!): POSITIVE [SG]    Clinical Course User Index [SG] Elnor Jayson LABOR, DO      I have discussed the diagnosis/risks/treatment options with the patient.  Evaluation and diagnostic testing in the emergency department does not suggest an emergent condition requiring admission or immediate intervention beyond what has been performed at this time.  They will follow up with pcp. We also discussed returning to the ED immediately if new or worsening sx occur. We discussed the sx which are most concerning (e.g., sudden worsening pain, fever, inability to tolerate by mouth)  that necessitate immediate return.  The patient appears reasonably screened and/or stabilized for discharge and I doubt any other medical condition or other [facility] requiring further screening, evaluation, or treatment in the ED at this time prior to discharge.                 Additional history obtained: -Additional history obtained from na -External records from outside source obtained and reviewed including: Chart review including previous notes, labs, imaging, consultation notes including  Pdmp Home meds Prior er eval   Lab Tests: -I ordered, reviewed, and interpreted labs.   The pertinent results include:   Labs Reviewed  RESP PANEL BY RT-PCR (RSV, FLU A&B, COVID)  RVPGX2 - Abnormal; Notable for the following components:      Result Value   Influenza A by PCR POSITIVE (*)    All other components within normal limits  COMPREHENSIVE METABOLIC PANEL WITH GFR - Abnormal; Notable for the following components:   Glucose, Bld 103 (*)    All other components within normal limits  URINALYSIS, ROUTINE W REFLEX MICROSCOPIC - Abnormal; Notable for the following components:   Hgb urine dipstick SMALL (*)    All other components within normal limits  CBC WITH DIFFERENTIAL/PLATELET - Abnormal; Notable for the following components:   WBC 11.5 (*)    Monocytes Absolute 1.7 (*)    All other components within normal limits  URINALYSIS, MICROSCOPIC (REFLEX) - Abnormal; Notable for the following components:   Bacteria, UA RARE (*)    All other components within normal limits  LIPASE, BLOOD    Notable for flu  EKG   EKG Interpretation Date/Time:    Ventricular Rate:    PR Interval:    QRS Duration:    QT Interval:    QTC Calculation:   R Axis:      Text Interpretation:           Imaging Studies ordered: I ordered imaging studies including CTAP I independently visualized the following imaging with scope of interpretation limited to determining acute life threatening  conditions related to emergency care; findings noted above I agree with the radiologist interpretation If any imaging was obtained with contrast I closely monitored patient for any possible adverse reaction a/w contrast administration in the emergency department   Medicines ordered and prescription drug management: Meds ordered this encounter  Medications   DISCONTD: ondansetron  (ZOFRAN ) injection 4 mg   morphine  (PF) 4 MG/ML injection 4 mg   ondansetron  (ZOFRAN ) injection 4 mg   sodium chloride  0.9 % bolus 1,000 mL   iohexol  (OMNIPAQUE ) 300 MG/ML solution 100 mL   HYDROmorphone  (DILAUDID ) injection 0.5 mg   oxyCODONE  (Oxy IR/ROXICODONE ) immediate release tablet 5 mg    Refill:  0   DISCONTD: dicyclomine  (BENTYL ) capsule 10 mg   HYDROmorphone  (DILAUDID ) injection 0.5 mg   oxyCODONE  (ROXICODONE ) 5 MG immediate release tablet    Sig: Take 1 tablet (5 mg total) by mouth every 4 (four) hours as needed.    Dispense:  10 tablet    Refill:  0   ondansetron  (ZOFRAN ) 4 MG tablet    Sig: Take 1 tablet (4 mg total) by mouth every 8 (eight) hours as needed.    Dispense:  6 tablet    Refill:  0    -I have reviewed the patients home medicines and have made adjustments as needed   Consultations Obtained: na   Cardiac Monitoring: The patient was maintained on a cardiac monitor.  I personally viewed and interpreted the cardiac monitored  which showed an underlying rhythm of: nsr Continuous pulse oximetry interpreted by myself, 99% on RA.    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    Final Clinical Impression(s) / ED Diagnoses Final diagnoses:  Influenza A  Abdominal pain, unspecified abdominal location         [1]  Social History Tobacco Use   Smoking status: Former    Current packs/day: 0.50    Types: Cigarettes   Smokeless tobacco: Never  Vaping Use   Vaping status: Every Day   Start date: 08/08/2019   Substances: Nicotine  Substance Use Topics   Alcohol use: Not Currently    Comment: occ   Drug use: No     Elnor Jayson LABOR, DO 09/28/24 0023  "

## 2024-09-25 NOTE — ED Notes (Signed)
Patient requesting additional pain medication. Provider notified.

## 2024-09-25 NOTE — ED Notes (Signed)
 Patient requesting pain medication. Provider notified. Pending new orders.

## 2024-09-25 NOTE — Discharge Instructions (Signed)
 It was a pleasure caring for you today in the emergency department.  Be sure to get plenty of rest, drink plenty of liquids over the next few days.  Stay at home until you are feeling better.  Follow-up with your primary care provider  Please return to the emergency department for any worsening or worrisome symptoms.

## 2024-09-25 NOTE — ED Notes (Signed)
 Patient transferred from waiting room to ED treatment room. Assuming pt care at this time.

## 2024-09-25 NOTE — ED Notes (Signed)
 Patient states that 3 of his children tested positive for Flu A on 09/21/2024

## 2024-09-25 NOTE — ED Notes (Signed)
Patient transported to imaging at this time.

## 2024-09-25 NOTE — ED Triage Notes (Signed)
 Pt c/o generalized abd pain; he thinks it's his auto-immune disease reacting to a virus; +NV, denies diarrhea

## 2024-09-25 NOTE — ED Notes (Signed)
 Patient requesting additional pain medication. Provider notified. Pending new orders.

## 2024-09-27 ENCOUNTER — Other Ambulatory Visit: Payer: Self-pay

## 2024-09-27 ENCOUNTER — Emergency Department (HOSPITAL_BASED_OUTPATIENT_CLINIC_OR_DEPARTMENT_OTHER)
Admission: EM | Admit: 2024-09-27 | Discharge: 2024-09-28 | Disposition: A | Discharge status: Home / Self Care | Attending: Emergency Medicine | Admitting: Emergency Medicine

## 2024-09-27 ENCOUNTER — Encounter (HOSPITAL_BASED_OUTPATIENT_CLINIC_OR_DEPARTMENT_OTHER): Payer: Self-pay | Admitting: Emergency Medicine

## 2024-09-27 DIAGNOSIS — R1030 Lower abdominal pain, unspecified: Secondary | ICD-10-CM | POA: Diagnosis present

## 2024-09-27 DIAGNOSIS — D72829 Elevated white blood cell count, unspecified: Secondary | ICD-10-CM | POA: Diagnosis not present

## 2024-09-27 DIAGNOSIS — R10817 Generalized abdominal tenderness: Secondary | ICD-10-CM | POA: Diagnosis not present

## 2024-09-27 LAB — COMPREHENSIVE METABOLIC PANEL WITH GFR
ALT: 23 U/L (ref 0–44)
AST: 23 U/L (ref 15–41)
Albumin: 4.6 g/dL (ref 3.5–5.0)
Alkaline Phosphatase: 85 U/L (ref 38–126)
Anion gap: 14 (ref 5–15)
BUN: 10 mg/dL (ref 6–20)
CO2: 23 mmol/L (ref 22–32)
Calcium: 9.3 mg/dL (ref 8.9–10.3)
Chloride: 99 mmol/L (ref 98–111)
Creatinine, Ser: 1.04 mg/dL (ref 0.61–1.24)
GFR, Estimated: >60 mL/min
Glucose, Bld: 80 mg/dL (ref 70–99)
Potassium: 3.7 mmol/L (ref 3.5–5.1)
Sodium: 136 mmol/L (ref 135–145)
Total Bilirubin: 0.4 mg/dL (ref 0.0–1.2)
Total Protein: 7.6 g/dL (ref 6.5–8.1)

## 2024-09-27 LAB — URINALYSIS, ROUTINE W REFLEX MICROSCOPIC
Bilirubin Urine: NEGATIVE
Glucose, UA: NEGATIVE mg/dL
Ketones, ur: 15 mg/dL — AB
Leukocytes,Ua: NEGATIVE
Nitrite: NEGATIVE
Protein, ur: NEGATIVE mg/dL
Specific Gravity, Urine: 1.015 (ref 1.005–1.030)
pH: 6.5 (ref 5.0–8.0)

## 2024-09-27 LAB — URINALYSIS, MICROSCOPIC (REFLEX)

## 2024-09-27 LAB — CBC
HCT: 46.6 % (ref 39.0–52.0)
Hemoglobin: 16.2 g/dL (ref 13.0–17.0)
MCH: 28.5 pg (ref 26.0–34.0)
MCHC: 34.8 g/dL (ref 30.0–36.0)
MCV: 82 fL (ref 80.0–100.0)
Platelets: 280 K/uL (ref 150–400)
RBC: 5.68 MIL/uL (ref 4.22–5.81)
RDW: 12.8 % (ref 11.5–15.5)
WBC: 11.9 K/uL — ABNORMAL HIGH (ref 4.0–10.5)
nRBC: 0 % (ref 0.0–0.2)

## 2024-09-27 LAB — LIPASE, BLOOD: Lipase: 21 U/L (ref 11–51)

## 2024-09-27 MED ORDER — HYDROMORPHONE HCL 1 MG/ML IJ SOLN
1.0000 mg | Freq: Once | INTRAMUSCULAR | Status: AC
  Administered 2024-09-27: 1 mg via INTRAVENOUS
  Filled 2024-09-27: qty 1

## 2024-09-27 MED ORDER — ONDANSETRON HCL 4 MG/2ML IJ SOLN
4.0000 mg | Freq: Once | INTRAMUSCULAR | Status: AC
  Administered 2024-09-27: 4 mg via INTRAVENOUS
  Filled 2024-09-27: qty 2

## 2024-09-27 MED ORDER — MORPHINE SULFATE (PF) 4 MG/ML IV SOLN
4.0000 mg | Freq: Once | INTRAVENOUS | Status: AC
  Administered 2024-09-27: 4 mg via INTRAVENOUS
  Filled 2024-09-27: qty 1

## 2024-09-27 MED ORDER — OXYCODONE-ACETAMINOPHEN 5-325 MG PO TABS: 1.0000 | ORAL_TABLET | Freq: Four times a day (QID) | ORAL | 0 refills | Status: AC | PRN

## 2024-09-27 MED ORDER — SODIUM CHLORIDE 0.9 % IV BOLUS
1000.0000 mL | Freq: Once | INTRAVENOUS | Status: AC
  Administered 2024-09-27: 1000 mL via INTRAVENOUS

## 2024-09-27 NOTE — ED Notes (Signed)
 Pt. Here for c/o flu and other symptoms.  Pt. Reports he has terrible symptoms with this flu and his abd. And lower back is hurting more on R than on the L.  Pt. Reports he was seen here on Sat.  But is having more pain here today.  Pt. States he vomited one time today.

## 2024-09-27 NOTE — Discharge Instructions (Addendum)
 Lab work and exam are reassuring today.  Please continue to take Tylenol  as needed for fever and pain at home.  Take at home pain management as needed for any pain that may not be managed with Tylenol .  Please follow-up with [facility] and follow all recommendations.  If you experience any new or worsening symptoms please return to ED for further evaluation.

## 2024-09-27 NOTE — ED Triage Notes (Signed)
 Pt arrives w/ c/o severe central abd pain & kidney pain. Was dx w/ flu 2 days ago. Reports n/v.  Hx Auto immune disease.

## 2024-09-27 NOTE — ED Provider Notes (Incomplete)
 " [place] [facility] Provider Note   CSN: [id number] Arrival date & time: 09/27/24  2052     Patient presents with: Abdominal Pain and Flank Pain   Nathan Daugherty is a 33 y.o. male.  32 year old male presents to the ED with complaints of abdominal pain radiating to his right kidney since being diagnosed with flu on Saturday.  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  He is managed by [facility] for the condition and is given rituximab  had every 4 months.  His last treatment was in October.  Patient reports he was seen here Saturday and diagnosed with flu but reports similar symptoms with severe abdominal pain.  He was given pain management and fluids and was able to go home and thought he was on the mend when today he started having more severe pain in his abdomen rating to his right kidney.  He reported the pain got so severe he decided to come back in for further evaluation and treatment.     Prior to Admission medications  Medication Sig Start Date End Date Taking? Authorizing Provider  acetaminophen  (TYLENOL ) 325 MG tablet Take 2 tablets (650 mg total) by mouth every 6 (six) hours as needed for mild pain (pain score 1-3) or fever (or Fever >/= 101). 05/16/24   Will Almarie MATSU, MD  bisacodyl  (DULCOLAX) 5 MG EC tablet Take 1 tablet (5 mg total) by mouth daily as needed for moderate constipation. 05/16/24   Will Almarie MATSU, MD  dicyclomine  (BENTYL ) 20 MG tablet Take 1 tablet (20 mg total) by mouth 3 (three) times daily before meals. 07/15/24   Freddi Hamilton, MD  gabapentin  (NEURONTIN ) 300 MG capsule Take 300 mg by mouth at bedtime. 04/30/24   [provider]  ibuprofen  (ADVIL ) 200 MG tablet Take 800 mg by mouth daily as needed for mild pain (pain score 1-3) or moderate pain (pain score 4-6).    [provider]  metoCLOPramide  (REGLAN ) 10 MG tablet Take  1 tablet (10 mg total) by mouth every 8 (eight) hours as needed for nausea. 07/15/24   Freddi Hamilton, MD  ondansetron  (ZOFRAN ) 4 MG tablet Take 1 tablet (4 mg total) by mouth every 8 (eight) hours as needed. 09/25/24   Elnor Jayson LABOR, DO  oxyCODONE  (ROXICODONE ) 5 MG immediate release tablet Take 1 tablet (5 mg total) by mouth every 4 (four) hours as needed. 09/25/24   Elnor Jayson LABOR, DO  riTUXimab  (RITUXAN ) 500 MG/50ML injection Inject into the vein See admin instructions. Every 4 months    [provider]  senna-docusate (SENOKOT-S) 8.6-50 MG tablet Take 1 tablet by mouth at bedtime as needed for mild constipation. 05/16/24   Will Almarie MATSU, MD    Allergies: Toradol  [ketorolac  tromethamine ] and Tramadol    Review of Systems  Gastrointestinal:  Positive for abdominal pain, nausea and vomiting.  Genitourinary:  Positive for flank pain.  All other systems reviewed and are negative.   Updated Vital Signs BP 121/78 (BP Location: Left Arm)   Pulse (!) 113   Temp 98.5 F (36.9 C)   Resp 20   SpO2 99%   Physical Exam Vitals and nursing note reviewed.  Constitutional:      Appearance: Normal appearance.  HENT:     Head: Normocephalic and atraumatic.     Nose: Nose normal.  Eyes:     Extraocular Movements: Extraocular movements intact.  Conjunctiva/sclera: Conjunctivae normal.     Pupils: Pupils are equal, round, and reactive to light.  Cardiovascular:     Rate and Rhythm: Normal rate.  Pulmonary:     Effort: Pulmonary effort is normal. No respiratory distress.     Breath sounds: Normal breath sounds.     Comments: Lungs are clear to auscultation in all fields Abdominal:     General: Abdomen is flat. There is no distension.     Palpations: Abdomen is soft.     Tenderness: There is generalized abdominal tenderness.  Musculoskeletal:        General: Normal range of motion.     Cervical back: Normal range of motion.  Neurological:     General: No focal deficit  present.     Mental Status: He is alert.  Psychiatric:        Mood and Affect: Mood normal.        Behavior: Behavior normal.     (all labs ordered are listed, but only abnormal results are displayed) Labs Reviewed  LIPASE, BLOOD  COMPREHENSIVE METABOLIC PANEL WITH GFR  CBC  URINALYSIS, ROUTINE W REFLEX MICROSCOPIC    EKG: None  Radiology: No results found.   Procedures   Medications Ordered in the ED  sodium chloride  0.9 % bolus 1,000 mL (has no administration in time range)  morphine  (PF) 4 MG/ML injection 4 mg (has no administration in time range)  ondansetron  (ZOFRAN ) injection 4 mg (has no administration in time range)   33 y.o. male presents to the ED with complaints of ***, this involves an extensive number of treatment options, and is a complaint that carries with it a high risk of complications and morbidity.  The differential diagnosis includes *** (Ddx)  On arrival pt is nontoxic, vitals ***. Exam significant for ***  Additional history obtained from ***. Previous records obtained and reviewed ***  I ordered medication *** for ***  Lab Tests:  I Ordered, reviewed, and interpreted labs, which included:   Imaging Studies ordered:  I ordered imaging studies which included ***, I independently visualized and interpreted imaging which showed ***  ED Course:   Patient was seen Saturday in the emergency department with overall reassuring workup.  CT scan noted some ground glass opacities in the right lower lobe compatible small airway infection/inflammation.  Chest x-ray was consistent with viral illness.  In the setting of fluid.  Today patient denies any shortness of breath or chest pain.  Patient denies any fevers at home.  He is presenting for complaints of abdominal pain radiating to his right kidney.  He reports due to his Behcet's disease he will typically have pain severe pain when he experiences viral illnesses.  Patient reports typically pain management and  fluids help throughout these episodes.  Patient will be started on IV fluids and pain management.  Patient reports initial dose of morphine  helped a lot but pain started to come back.  Patient will be given Dilaudid .  Patient reports he has pain management at home and tries to manage at home with Tylenol  and ibuprofen  before using any pain pills.   Portions of this note were generated with Scientist, clinical (histocompatibility and immunogenetics). Dictation errors may occur despite best attempts at proofreading.    Final diagnoses:  None    ED Discharge Orders     None        "

## 2024-09-27 NOTE — ED Provider Notes (Signed)
 " [facility] Provider Note   CSN: [id number] Arrival date & time: 09/27/24  2052     Patient presents with: Abdominal Pain and Flank Pain   Nathan Daugherty is a 33 y.o. male.  33 year old male presents to the ED with complaints of abdominal pain radiating to his right kidney since being diagnosed with flu on Saturday.  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  He is managed by [facility] for the condition and is given rituximab  had every 4 months.  His last treatment was in October.  Patient reports he was seen here Saturday and diagnosed with flu but reports similar symptoms with severe abdominal pain.  He was given pain management and fluids and was able to go home and thought he was on the mend when today he started having more severe pain in his abdomen rating to his right kidney.  He reported the pain got so severe he decided to come back in for further evaluation and treatment.     Prior to Admission medications  Medication Sig Start Date End Date Taking? Authorizing Provider  acetaminophen  (TYLENOL ) 325 MG tablet Take 2 tablets (650 mg total) by mouth every 6 (six) hours as needed for mild pain (pain score 1-3) or fever (or Fever >/= 101). 05/16/24   Will Almarie MATSU, MD  bisacodyl  (DULCOLAX) 5 MG EC tablet Take 1 tablet (5 mg total) by mouth daily as needed for moderate constipation. 05/16/24   Will Almarie MATSU, MD  dicyclomine  (BENTYL ) 20 MG tablet Take 1 tablet (20 mg total) by mouth 3 (three) times daily before meals. 07/15/24   Freddi Hamilton, MD  gabapentin  (NEURONTIN ) 300 MG capsule Take 300 mg by mouth at bedtime. 04/30/24   [provider]  ibuprofen  (ADVIL ) 200 MG tablet Take 800 mg by mouth daily as needed for mild pain (pain score 1-3) or moderate pain (pain score 4-6).    [provider]  metoCLOPramide  (REGLAN ) 10 MG tablet Take  1 tablet (10 mg total) by mouth every 8 (eight) hours as needed for nausea. 07/15/24   Freddi Hamilton, MD  ondansetron  (ZOFRAN ) 4 MG tablet Take 1 tablet (4 mg total) by mouth every 8 (eight) hours as needed. 09/25/24   Elnor Jayson LABOR, DO  oxyCODONE  (ROXICODONE ) 5 MG immediate release tablet Take 1 tablet (5 mg total) by mouth every 4 (four) hours as needed. 09/25/24   Elnor Jayson LABOR, DO  riTUXimab  (RITUXAN ) 500 MG/50ML injection Inject into the vein See admin instructions. Every 4 months    [provider]  senna-docusate (SENOKOT-S) 8.6-50 MG tablet Take 1 tablet by mouth at bedtime as needed for mild constipation. 05/16/24   Will Almarie MATSU, MD    Allergies: Toradol  [ketorolac  tromethamine ] and Tramadol    Review of Systems  Gastrointestinal:  Positive for abdominal pain, nausea and vomiting.  Genitourinary:  Positive for flank pain.  All other systems reviewed and are negative.   Updated Vital Signs BP 121/78 (BP Location: Left Arm)   Pulse (!) 113   Temp 98.5 F (36.9 C)   Resp 20   SpO2 99%   Physical Exam Vitals and nursing note reviewed.  Constitutional:      Appearance: Normal appearance.  HENT:     Head: Normocephalic and atraumatic.     Nose: Nose normal.  Eyes:     Extraocular Movements: Extraocular movements intact.  Conjunctiva/sclera: Conjunctivae normal.     Pupils: Pupils are equal, round, and reactive to light.  Cardiovascular:     Rate and Rhythm: Normal rate.  Pulmonary:     Effort: Pulmonary effort is normal. No respiratory distress.     Breath sounds: Normal breath sounds.     Comments: Lungs are clear to auscultation in all fields Abdominal:     General: Abdomen is flat. There is no distension.     Palpations: Abdomen is soft.     Tenderness: There is generalized abdominal tenderness.  Musculoskeletal:        General: Normal range of motion.     Cervical back: Normal range of motion.  Neurological:     General: No focal deficit  present.     Mental Status: He is alert.  Psychiatric:        Mood and Affect: Mood normal.        Behavior: Behavior normal.     (all labs ordered are listed, but only abnormal results are displayed) Labs Reviewed  LIPASE, BLOOD  COMPREHENSIVE METABOLIC PANEL WITH GFR  CBC  URINALYSIS, ROUTINE W REFLEX MICROSCOPIC    EKG: None  Radiology: No results found.   Procedures   Medications Ordered in the ED  sodium chloride  0.9 % bolus 1,000 mL (has no administration in time range)  morphine  (PF) 4 MG/ML injection 4 mg (has no administration in time range)  ondansetron  (ZOFRAN ) injection 4 mg (has no administration in time range)      Final diagnoses:  None    ED Discharge Orders     None        "
